# Patient Record
Sex: Female | Born: 1955
Health system: Southern US, Community
[De-identification: ages and names within clinical notes are randomized; demographics above are authoritative.]

## PROBLEM LIST (undated history)

## (undated) DIAGNOSIS — E7800 Pure hypercholesterolemia, unspecified: Secondary | ICD-10-CM

## (undated) DIAGNOSIS — M1990 Unspecified osteoarthritis, unspecified site: Secondary | ICD-10-CM

## (undated) DIAGNOSIS — R112 Nausea with vomiting, unspecified: Secondary | ICD-10-CM

## (undated) DIAGNOSIS — F419 Anxiety disorder, unspecified: Secondary | ICD-10-CM

## (undated) DIAGNOSIS — K219 Gastro-esophageal reflux disease without esophagitis: Secondary | ICD-10-CM

## (undated) DIAGNOSIS — Z98811 Dental restoration status: Secondary | ICD-10-CM

## (undated) DIAGNOSIS — M65841 Other synovitis and tenosynovitis, right hand: Secondary | ICD-10-CM

## (undated) DIAGNOSIS — J45909 Unspecified asthma, uncomplicated: Secondary | ICD-10-CM

## (undated) DIAGNOSIS — F32A Depression, unspecified: Secondary | ICD-10-CM

## (undated) DIAGNOSIS — F329 Major depressive disorder, single episode, unspecified: Secondary | ICD-10-CM

## (undated) DIAGNOSIS — E119 Type 2 diabetes mellitus without complications: Secondary | ICD-10-CM

## (undated) DIAGNOSIS — I10 Essential (primary) hypertension: Secondary | ICD-10-CM

## (undated) DIAGNOSIS — L409 Psoriasis, unspecified: Secondary | ICD-10-CM

## (undated) DIAGNOSIS — Z9889 Other specified postprocedural states: Secondary | ICD-10-CM

## (undated) HISTORY — PX: REDUCTION MAMMAPLASTY: SUR839

---
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
## 1993-11-18 HISTORY — PX: MYOMECTOMY: SHX85

## 1998-10-23 ENCOUNTER — Other Ambulatory Visit: Admission: RE | Admit: 1998-10-23 | Discharge: 1998-10-23 | Payer: Self-pay | Admitting: Obstetrics & Gynecology

## 1998-11-14 ENCOUNTER — Ambulatory Visit (HOSPITAL_COMMUNITY): Admission: RE | Admit: 1998-11-14 | Discharge: 1998-11-14 | Payer: Self-pay | Admitting: Obstetrics & Gynecology

## 1998-11-14 ENCOUNTER — Encounter: Payer: Self-pay | Admitting: Obstetrics & Gynecology

## 1998-11-18 HISTORY — PX: BREAST REDUCTION SURGERY: SHX8

## 1999-11-28 ENCOUNTER — Ambulatory Visit (HOSPITAL_COMMUNITY): Admission: RE | Admit: 1999-11-28 | Discharge: 1999-11-28 | Payer: Self-pay | Admitting: Obstetrics & Gynecology

## 1999-11-28 ENCOUNTER — Encounter: Payer: Self-pay | Admitting: Obstetrics & Gynecology

## 2000-02-20 ENCOUNTER — Encounter (INDEPENDENT_AMBULATORY_CARE_PROVIDER_SITE_OTHER): Payer: Self-pay

## 2000-02-20 ENCOUNTER — Other Ambulatory Visit: Admission: RE | Admit: 2000-02-20 | Discharge: 2000-02-20 | Payer: Self-pay | Admitting: Obstetrics and Gynecology

## 2000-12-05 ENCOUNTER — Ambulatory Visit (HOSPITAL_COMMUNITY): Admission: RE | Admit: 2000-12-05 | Discharge: 2000-12-05 | Payer: Self-pay | Admitting: Obstetrics and Gynecology

## 2000-12-05 ENCOUNTER — Encounter: Payer: Self-pay | Admitting: Obstetrics and Gynecology

## 2000-12-16 ENCOUNTER — Other Ambulatory Visit: Admission: RE | Admit: 2000-12-16 | Discharge: 2000-12-16 | Payer: Self-pay | Admitting: Obstetrics and Gynecology

## 2001-12-08 ENCOUNTER — Ambulatory Visit (HOSPITAL_COMMUNITY): Admission: RE | Admit: 2001-12-08 | Discharge: 2001-12-08 | Payer: Self-pay | Admitting: Obstetrics and Gynecology

## 2001-12-08 ENCOUNTER — Encounter: Payer: Self-pay | Admitting: Obstetrics and Gynecology

## 2001-12-14 ENCOUNTER — Other Ambulatory Visit: Admission: RE | Admit: 2001-12-14 | Discharge: 2001-12-14 | Payer: Self-pay | Admitting: Obstetrics and Gynecology

## 2002-08-02 ENCOUNTER — Encounter: Admission: RE | Admit: 2002-08-02 | Discharge: 2002-10-31 | Payer: Self-pay | Admitting: Endocrinology

## 2002-11-08 ENCOUNTER — Encounter: Admission: RE | Admit: 2002-11-08 | Discharge: 2003-02-06 | Payer: Self-pay | Admitting: Endocrinology

## 2002-12-09 ENCOUNTER — Ambulatory Visit (HOSPITAL_COMMUNITY): Admission: RE | Admit: 2002-12-09 | Discharge: 2002-12-09 | Payer: Self-pay | Admitting: Obstetrics and Gynecology

## 2002-12-09 ENCOUNTER — Encounter: Payer: Self-pay | Admitting: Obstetrics and Gynecology

## 2002-12-15 ENCOUNTER — Other Ambulatory Visit: Admission: RE | Admit: 2002-12-15 | Discharge: 2002-12-15 | Payer: Self-pay | Admitting: Obstetrics and Gynecology

## 2002-12-29 ENCOUNTER — Encounter: Admission: RE | Admit: 2002-12-29 | Discharge: 2003-03-29 | Payer: Self-pay | Admitting: Endocrinology

## 2003-02-07 ENCOUNTER — Other Ambulatory Visit: Admission: RE | Admit: 2003-02-07 | Discharge: 2003-02-07 | Payer: Self-pay | Admitting: Obstetrics and Gynecology

## 2003-03-18 ENCOUNTER — Encounter (INDEPENDENT_AMBULATORY_CARE_PROVIDER_SITE_OTHER): Payer: Self-pay | Admitting: *Deleted

## 2003-03-18 ENCOUNTER — Ambulatory Visit (HOSPITAL_COMMUNITY): Admission: RE | Admit: 2003-03-18 | Discharge: 2003-03-18 | Payer: Self-pay | Admitting: Obstetrics and Gynecology

## 2003-03-18 HISTORY — PX: LEEP: SHX91

## 2003-08-17 ENCOUNTER — Other Ambulatory Visit: Admission: RE | Admit: 2003-08-17 | Discharge: 2003-08-17 | Payer: Self-pay | Admitting: Obstetrics and Gynecology

## 2003-12-12 ENCOUNTER — Ambulatory Visit (HOSPITAL_COMMUNITY): Admission: RE | Admit: 2003-12-12 | Discharge: 2003-12-12 | Payer: Self-pay | Admitting: Obstetrics and Gynecology

## 2003-12-19 ENCOUNTER — Other Ambulatory Visit: Admission: RE | Admit: 2003-12-19 | Discharge: 2003-12-19 | Payer: Self-pay | Admitting: Obstetrics and Gynecology

## 2004-10-19 ENCOUNTER — Ambulatory Visit: Payer: Self-pay | Admitting: Licensed Clinical Social Worker

## 2004-11-02 ENCOUNTER — Ambulatory Visit: Payer: Self-pay | Admitting: Licensed Clinical Social Worker

## 2004-11-23 ENCOUNTER — Ambulatory Visit: Payer: Self-pay | Admitting: Licensed Clinical Social Worker

## 2004-12-21 ENCOUNTER — Ambulatory Visit (HOSPITAL_COMMUNITY): Admission: RE | Admit: 2004-12-21 | Discharge: 2004-12-21 | Payer: Self-pay | Admitting: Obstetrics and Gynecology

## 2004-12-26 ENCOUNTER — Other Ambulatory Visit: Admission: RE | Admit: 2004-12-26 | Discharge: 2004-12-26 | Payer: Self-pay | Admitting: Obstetrics and Gynecology

## 2004-12-28 ENCOUNTER — Ambulatory Visit: Payer: Self-pay | Admitting: Licensed Clinical Social Worker

## 2005-01-11 ENCOUNTER — Encounter: Admission: RE | Admit: 2005-01-11 | Discharge: 2005-01-11 | Payer: Self-pay | Admitting: Obstetrics and Gynecology

## 2005-01-25 ENCOUNTER — Ambulatory Visit: Payer: Self-pay | Admitting: Licensed Clinical Social Worker

## 2005-02-08 ENCOUNTER — Ambulatory Visit: Payer: Self-pay | Admitting: Licensed Clinical Social Worker

## 2005-03-08 ENCOUNTER — Ambulatory Visit: Payer: Self-pay | Admitting: Licensed Clinical Social Worker

## 2005-03-29 ENCOUNTER — Ambulatory Visit: Payer: Self-pay | Admitting: Licensed Clinical Social Worker

## 2005-04-30 ENCOUNTER — Ambulatory Visit: Payer: Self-pay | Admitting: Licensed Clinical Social Worker

## 2005-05-02 ENCOUNTER — Ambulatory Visit: Payer: Self-pay | Admitting: Internal Medicine

## 2005-05-15 ENCOUNTER — Ambulatory Visit: Payer: Self-pay | Admitting: Licensed Clinical Social Worker

## 2005-06-07 ENCOUNTER — Ambulatory Visit: Payer: Self-pay | Admitting: Licensed Clinical Social Worker

## 2005-06-28 ENCOUNTER — Ambulatory Visit: Payer: Self-pay | Admitting: Licensed Clinical Social Worker

## 2005-12-27 ENCOUNTER — Other Ambulatory Visit: Admission: RE | Admit: 2005-12-27 | Discharge: 2005-12-27 | Payer: Self-pay | Admitting: Obstetrics and Gynecology

## 2006-01-15 ENCOUNTER — Ambulatory Visit (HOSPITAL_COMMUNITY): Admission: RE | Admit: 2006-01-15 | Discharge: 2006-01-15 | Payer: Self-pay | Admitting: Obstetrics and Gynecology

## 2006-04-10 ENCOUNTER — Ambulatory Visit: Payer: Self-pay | Admitting: Endocrinology

## 2006-04-16 ENCOUNTER — Ambulatory Visit: Payer: Self-pay | Admitting: Oncology

## 2006-04-18 ENCOUNTER — Ambulatory Visit: Payer: Self-pay | Admitting: Endocrinology

## 2006-04-21 LAB — CBC WITH DIFFERENTIAL/PLATELET
Basophils Absolute: 0 10*3/uL (ref 0.0–0.1)
Eosinophils Absolute: 0 10*3/uL (ref 0.0–0.5)
HCT: 38.3 % (ref 34.8–46.6)
HGB: 13.2 g/dL (ref 11.6–15.9)
LYMPH%: 27.2 % (ref 14.0–48.0)
MCV: 94.1 fL (ref 81.0–101.0)
MONO#: 0.4 10*3/uL (ref 0.1–0.9)
MONO%: 15.6 % — ABNORMAL HIGH (ref 0.0–13.0)
NEUT#: 1.4 10*3/uL — ABNORMAL LOW (ref 1.5–6.5)
NEUT%: 55.5 % (ref 39.6–76.8)
Platelets: 263 10*3/uL (ref 145–400)
RBC: 4.07 10*6/uL (ref 3.70–5.32)
WBC: 2.5 10*3/uL — ABNORMAL LOW (ref 3.9–10.0)

## 2006-04-21 LAB — CHCC SMEAR

## 2006-04-22 LAB — COMPREHENSIVE METABOLIC PANEL
ALT: 22 U/L (ref 0–40)
BUN: 16 mg/dL (ref 6–23)
CO2: 26 mEq/L (ref 19–32)
Calcium: 9.2 mg/dL (ref 8.4–10.5)
Chloride: 103 mEq/L (ref 96–112)
Creatinine, Ser: 1.01 mg/dL (ref 0.40–1.20)
Glucose, Bld: 79 mg/dL (ref 70–99)
Total Bilirubin: 0.4 mg/dL (ref 0.3–1.2)

## 2006-04-22 LAB — LACTATE DEHYDROGENASE: LDH: 152 U/L (ref 94–250)

## 2006-04-22 LAB — ANA: Anti Nuclear Antibody(ANA): POSITIVE — AB

## 2006-04-24 LAB — ANTI-NUCLEAR AB-TITER (ANA TITER): ANA Titer 1: 1:640 {titer} — ABNORMAL HIGH

## 2006-04-30 ENCOUNTER — Ambulatory Visit: Payer: Self-pay

## 2006-06-10 ENCOUNTER — Ambulatory Visit: Payer: Self-pay | Admitting: Oncology

## 2006-06-16 LAB — CBC WITH DIFFERENTIAL/PLATELET
BASO%: 0.6 % (ref 0.0–2.0)
Basophils Absolute: 0 10e3/uL (ref 0.0–0.1)
EOS%: 4.8 % (ref 0.0–7.0)
Eosinophils Absolute: 0.1 10e3/uL (ref 0.0–0.5)
HCT: 36 % (ref 34.8–46.6)
HGB: 12.6 g/dL (ref 11.6–15.9)
LYMPH%: 34.5 % (ref 14.0–48.0)
MCH: 32.8 pg (ref 26.0–34.0)
MCHC: 34.9 g/dL (ref 32.0–36.0)
MCV: 93.9 fL (ref 81.0–101.0)
MONO#: 0.3 10e3/uL (ref 0.1–0.9)
MONO%: 12.4 % (ref 0.0–13.0)
NEUT#: 1.1 10e3/uL — ABNORMAL LOW (ref 1.5–6.5)
NEUT%: 47.7 % (ref 39.6–76.8)
Platelets: 340 10e3/uL (ref 145–400)
RBC: 3.83 10e6/uL (ref 3.70–5.32)
RDW: 14.1 % (ref 11.3–14.5)
WBC: 2.2 10e3/uL — ABNORMAL LOW (ref 3.9–10.0)
lymph#: 0.8 10e3/uL — ABNORMAL LOW (ref 0.9–3.3)

## 2007-01-29 ENCOUNTER — Ambulatory Visit (HOSPITAL_COMMUNITY): Admission: RE | Admit: 2007-01-29 | Discharge: 2007-01-29 | Payer: Self-pay | Admitting: Obstetrics and Gynecology

## 2007-04-14 ENCOUNTER — Ambulatory Visit: Payer: Self-pay | Admitting: Endocrinology

## 2007-04-14 LAB — CONVERTED CEMR LAB
Basophils Absolute: 0 10*3/uL (ref 0.0–0.1)
Bilirubin Urine: NEGATIVE
Bilirubin, Direct: 0.1 mg/dL (ref 0.0–0.3)
Cholesterol: 169 mg/dL (ref 0–200)
Eosinophils Absolute: 0 10*3/uL (ref 0.0–0.6)
GFR calc Af Amer: 136 mL/min
GFR calc non Af Amer: 112 mL/min
Glucose, Bld: 109 mg/dL — ABNORMAL HIGH (ref 70–99)
HCT: 38.2 % (ref 36.0–46.0)
Ketones, ur: NEGATIVE mg/dL
Leukocytes, UA: NEGATIVE
MCHC: 34.4 g/dL (ref 30.0–36.0)
MCV: 94 fL (ref 78.0–100.0)
Neutrophils Relative %: 59.4 % (ref 43.0–77.0)
Nitrite: NEGATIVE
Potassium: 3.7 meq/L (ref 3.5–5.1)
RBC: 4.06 M/uL (ref 3.87–5.11)
Sodium: 138 meq/L (ref 135–145)
TSH: 0.09 microintl units/mL — ABNORMAL LOW (ref 0.35–5.50)
Total CHOL/HDL Ratio: 2.5
Total Protein, Urine: NEGATIVE mg/dL
Total Protein: 7.4 g/dL (ref 6.0–8.3)
Triglycerides: 41 mg/dL (ref 0–149)
Urine Glucose: NEGATIVE mg/dL
Urobilinogen, UA: 0.2 (ref 0.0–1.0)
pH: 7.5 (ref 5.0–8.0)

## 2007-05-28 ENCOUNTER — Ambulatory Visit: Payer: Self-pay | Admitting: Endocrinology

## 2007-05-28 LAB — CONVERTED CEMR LAB
Hep A Total Ab: NEGATIVE
TSH: 0.99 microintl units/mL (ref 0.35–5.50)

## 2007-06-25 ENCOUNTER — Ambulatory Visit: Payer: Self-pay | Admitting: Endocrinology

## 2007-07-10 ENCOUNTER — Ambulatory Visit: Payer: Self-pay | Admitting: Endocrinology

## 2007-08-22 ENCOUNTER — Encounter: Payer: Self-pay | Admitting: *Deleted

## 2007-08-22 DIAGNOSIS — E785 Hyperlipidemia, unspecified: Secondary | ICD-10-CM | POA: Insufficient documentation

## 2007-08-22 DIAGNOSIS — E119 Type 2 diabetes mellitus without complications: Secondary | ICD-10-CM | POA: Insufficient documentation

## 2007-08-22 DIAGNOSIS — J309 Allergic rhinitis, unspecified: Secondary | ICD-10-CM | POA: Insufficient documentation

## 2007-08-22 DIAGNOSIS — T783XXA Angioneurotic edema, initial encounter: Secondary | ICD-10-CM | POA: Insufficient documentation

## 2007-08-22 DIAGNOSIS — F329 Major depressive disorder, single episode, unspecified: Secondary | ICD-10-CM | POA: Insufficient documentation

## 2007-08-22 DIAGNOSIS — B009 Herpesviral infection, unspecified: Secondary | ICD-10-CM | POA: Insufficient documentation

## 2008-01-01 ENCOUNTER — Ambulatory Visit: Payer: Self-pay | Admitting: Endocrinology

## 2008-01-01 LAB — CONVERTED CEMR LAB: TSH: 1.59 microintl units/mL (ref 0.35–5.50)

## 2008-02-10 ENCOUNTER — Encounter: Payer: Self-pay | Admitting: Endocrinology

## 2008-02-17 ENCOUNTER — Ambulatory Visit (HOSPITAL_COMMUNITY): Admission: RE | Admit: 2008-02-17 | Discharge: 2008-02-17 | Payer: Self-pay | Admitting: Obstetrics and Gynecology

## 2008-02-25 ENCOUNTER — Ambulatory Visit: Payer: Self-pay | Admitting: Endocrinology

## 2008-02-25 DIAGNOSIS — M6530 Trigger finger, unspecified finger: Secondary | ICD-10-CM | POA: Insufficient documentation

## 2008-03-04 ENCOUNTER — Encounter: Admission: RE | Admit: 2008-03-04 | Discharge: 2008-03-04 | Payer: Self-pay | Admitting: Neurology

## 2008-03-08 ENCOUNTER — Encounter: Payer: Self-pay | Admitting: Endocrinology

## 2008-04-05 ENCOUNTER — Encounter: Payer: Self-pay | Admitting: Endocrinology

## 2008-05-30 ENCOUNTER — Encounter: Payer: Self-pay | Admitting: Endocrinology

## 2008-10-06 ENCOUNTER — Encounter: Payer: Self-pay | Admitting: Endocrinology

## 2008-11-15 ENCOUNTER — Ambulatory Visit (HOSPITAL_BASED_OUTPATIENT_CLINIC_OR_DEPARTMENT_OTHER): Admission: RE | Admit: 2008-11-15 | Discharge: 2008-11-15 | Payer: Self-pay | Admitting: Orthopedic Surgery

## 2008-11-15 HISTORY — PX: TRIGGER FINGER RELEASE: SHX641

## 2008-11-22 ENCOUNTER — Encounter: Payer: Self-pay | Admitting: Endocrinology

## 2008-12-20 ENCOUNTER — Encounter: Payer: Self-pay | Admitting: Endocrinology

## 2009-01-04 ENCOUNTER — Encounter: Payer: Self-pay | Admitting: Endocrinology

## 2009-01-31 ENCOUNTER — Encounter: Payer: Self-pay | Admitting: Endocrinology

## 2009-02-20 ENCOUNTER — Ambulatory Visit (HOSPITAL_COMMUNITY): Admission: RE | Admit: 2009-02-20 | Discharge: 2009-02-20 | Payer: Self-pay | Admitting: Obstetrics and Gynecology

## 2009-03-02 ENCOUNTER — Ambulatory Visit: Payer: Self-pay | Admitting: Endocrinology

## 2009-03-04 LAB — CONVERTED CEMR LAB
AST: 27 units/L (ref 0–37)
Albumin: 3.8 g/dL (ref 3.5–5.2)
Alkaline Phosphatase: 78 units/L (ref 39–117)
BUN: 15 mg/dL (ref 6–23)
Basophils Absolute: 0 10*3/uL (ref 0.0–0.1)
Bilirubin, Direct: 0.1 mg/dL (ref 0.0–0.3)
CO2: 29 meq/L (ref 19–32)
Chloride: 105 meq/L (ref 96–112)
Creatinine, Ser: 0.7 mg/dL (ref 0.4–1.2)
Eosinophils Absolute: 0.1 10*3/uL (ref 0.0–0.7)
Hemoglobin, Urine: NEGATIVE
Hemoglobin: 12.9 g/dL (ref 12.0–15.0)
LDL Cholesterol: 97 mg/dL (ref 0–99)
Lymphocytes Relative: 39.3 % (ref 12.0–46.0)
MCHC: 33.3 g/dL (ref 30.0–36.0)
Monocytes Relative: 12.2 % — ABNORMAL HIGH (ref 3.0–12.0)
Neutro Abs: 0.9 10*3/uL — ABNORMAL LOW (ref 1.4–7.7)
Neutrophils Relative %: 43 % (ref 43.0–77.0)
Nitrite: NEGATIVE
Potassium: 4.1 meq/L (ref 3.5–5.1)
RDW: 12.6 % (ref 11.5–14.6)
Specific Gravity, Urine: 1.015 (ref 1.000–1.030)
Total Bilirubin: 0.5 mg/dL (ref 0.3–1.2)
Total CHOL/HDL Ratio: 3
Total Protein, Urine: NEGATIVE mg/dL
Triglycerides: 52 mg/dL (ref 0.0–149.0)
Urine Glucose: NEGATIVE mg/dL
VLDL: 10.4 mg/dL (ref 0.0–40.0)
pH: 6.5 (ref 5.0–8.0)

## 2009-03-06 ENCOUNTER — Ambulatory Visit: Payer: Self-pay | Admitting: Endocrinology

## 2009-03-06 DIAGNOSIS — D72819 Decreased white blood cell count, unspecified: Secondary | ICD-10-CM | POA: Insufficient documentation

## 2009-03-06 DIAGNOSIS — Z780 Asymptomatic menopausal state: Secondary | ICD-10-CM | POA: Insufficient documentation

## 2009-03-06 DIAGNOSIS — E042 Nontoxic multinodular goiter: Secondary | ICD-10-CM | POA: Insufficient documentation

## 2009-03-13 ENCOUNTER — Ambulatory Visit: Payer: Self-pay | Admitting: Internal Medicine

## 2009-03-13 ENCOUNTER — Encounter: Payer: Self-pay | Admitting: Endocrinology

## 2009-03-14 ENCOUNTER — Encounter: Payer: Self-pay | Admitting: Endocrinology

## 2009-04-28 ENCOUNTER — Ambulatory Visit: Payer: Self-pay | Admitting: Gastroenterology

## 2009-05-12 ENCOUNTER — Ambulatory Visit: Payer: Self-pay | Admitting: Gastroenterology

## 2009-06-02 ENCOUNTER — Encounter: Payer: Self-pay | Admitting: Endocrinology

## 2009-06-15 ENCOUNTER — Ambulatory Visit (HOSPITAL_BASED_OUTPATIENT_CLINIC_OR_DEPARTMENT_OTHER): Admission: RE | Admit: 2009-06-15 | Discharge: 2009-06-15 | Payer: Self-pay | Admitting: Orthopedic Surgery

## 2009-06-15 HISTORY — PX: TRIGGER FINGER RELEASE: SHX641

## 2009-06-23 ENCOUNTER — Encounter: Payer: Self-pay | Admitting: Endocrinology

## 2009-07-05 ENCOUNTER — Encounter: Payer: Self-pay | Admitting: Endocrinology

## 2009-07-26 ENCOUNTER — Encounter: Payer: Self-pay | Admitting: Endocrinology

## 2009-08-16 ENCOUNTER — Emergency Department (HOSPITAL_COMMUNITY): Admission: EM | Admit: 2009-08-16 | Discharge: 2009-08-16 | Payer: Self-pay | Admitting: Family Medicine

## 2009-11-07 ENCOUNTER — Encounter: Payer: Self-pay | Admitting: Endocrinology

## 2010-01-18 ENCOUNTER — Ambulatory Visit: Payer: Self-pay | Admitting: Endocrinology

## 2010-01-19 LAB — CONVERTED CEMR LAB: Hgb A1c MFr Bld: 6.5 % (ref 4.6–6.5)

## 2010-02-19 ENCOUNTER — Encounter: Payer: Self-pay | Admitting: Endocrinology

## 2010-02-19 ENCOUNTER — Telehealth: Payer: Self-pay | Admitting: Endocrinology

## 2010-03-12 ENCOUNTER — Telehealth: Payer: Self-pay | Admitting: Endocrinology

## 2010-05-07 ENCOUNTER — Encounter: Payer: Self-pay | Admitting: Endocrinology

## 2010-08-22 ENCOUNTER — Encounter: Payer: Self-pay | Admitting: Endocrinology

## 2010-09-18 ENCOUNTER — Ambulatory Visit (HOSPITAL_COMMUNITY): Admission: RE | Admit: 2010-09-18 | Discharge: 2010-09-18 | Payer: Self-pay | Admitting: Obstetrics and Gynecology

## 2010-12-07 ENCOUNTER — Other Ambulatory Visit: Payer: Self-pay | Admitting: Endocrinology

## 2010-12-07 ENCOUNTER — Ambulatory Visit
Admission: RE | Admit: 2010-12-07 | Discharge: 2010-12-07 | Payer: Self-pay | Source: Home / Self Care | Attending: Endocrinology | Admitting: Endocrinology

## 2010-12-07 LAB — LIPID PANEL
Cholesterol: 160 mg/dL (ref 0–200)
HDL: 55.1 mg/dL (ref >39.00)
LDL Cholesterol: 83 mg/dL (ref 0–99)
Total CHOL/HDL Ratio: 3
Triglycerides: 112 mg/dL (ref 0.0–149.0)
VLDL: 22.4 mg/dL (ref 0.0–40.0)

## 2010-12-07 LAB — TSH: TSH: 2.25 u[IU]/mL (ref 0.35–5.50)

## 2010-12-07 LAB — BASIC METABOLIC PANEL
BUN: 14 mg/dL (ref 6–23)
CO2: 32 mEq/L (ref 19–32)
Calcium: 9.2 mg/dL (ref 8.4–10.5)
Chloride: 103 mEq/L (ref 96–112)
Creatinine, Ser: 0.7 mg/dL (ref 0.4–1.2)
GFR: 115.82 mL/min (ref >60.00)
Glucose, Bld: 91 mg/dL (ref 70–99)
Potassium: 4 mEq/L (ref 3.5–5.1)
Sodium: 141 mEq/L (ref 135–145)

## 2010-12-07 LAB — HEMOGLOBIN A1C: Hgb A1c MFr Bld: 6.5 % (ref 4.6–6.5)

## 2010-12-09 ENCOUNTER — Encounter: Payer: Self-pay | Admitting: Obstetrics and Gynecology

## 2010-12-10 ENCOUNTER — Encounter: Payer: Self-pay | Admitting: Neurology

## 2010-12-18 NOTE — Progress Notes (Signed)
  Phone Note Refill Request Message from:  Fax from Pharmacy on March 12, 2010 11:56 AM  Refills Requested: Medication #1:  SIMVASTATIN 80 MG TABS 1 qhs   Dosage confirmed as above?Dosage Confirmed Initial call taken by: Josph Macho RMA,  March 12, 2010 11:56 AM    Prescriptions: SIMVASTATIN 80 MG TABS (SIMVASTATIN) 1 qhs  #90 x 2   Entered by:   Josph Macho RMA   Authorized by:   Minus Breeding MD   Signed by:   Josph Macho RMA on 03/12/2010   Method used:   Electronically to        [facility] (retail)       [address]       [place], [place]  [address]       Ph: [phone]       Fax: [phone]   RxID:   [id number]

## 2010-12-18 NOTE — Progress Notes (Signed)
  Phone Note Refill Request Message from:  Fax from Pharmacy on February 19, 2010 9:15 AM  Refills Requested: Medication #1:  TENEX 1 MG  TABS take 1 by mouth qd   Dosage confirmed as above?Dosage Confirmed Initial call taken by: Josph Macho RMA,  February 19, 2010 9:15 AM    Prescriptions: TENEX 1 MG  TABS (GUANFACINE HCL) take 1 by mouth qd  #90 x 2   Entered by:   Josph Macho RMA   Authorized by:   Minus Breeding MD   Signed by:   Josph Macho RMA on 02/19/2010   Method used:   Electronically to        [facility] (retail)       [address]       [place], [place]  [address]       Ph: [phone]       Fax: [phone]   RxID:   [id number]

## 2010-12-18 NOTE — Letter (Signed)
Summary: [facility]  [facility]   Imported By: Sherian Rein 09/03/2010 09:11:44  _____________________________________________________________________  External Attachment:    Type:   Image     Comment:   External Document

## 2010-12-18 NOTE — Assessment & Plan Note (Signed)
Summary: ALLERGIES/NWS  #   Vital Signs:  Patient profile:   55 year old female Height:      65 inches (165.10 cm) Weight:      194 pounds (88.18 kg) BMI:     32.40 O2 Sat:      97 % on Room air Temp:     97 degrees F (36.11 degrees C) oral Pulse rate:   85 / minute BP sitting:   130 / 82  (left arm) Cuff size:   regular  Vitals Entered By: Sydell Axon (January 18, 2010 3:29 PM)  O2 Flow:  Room air CC: Allergies/ pt states she has been coughing up thick mucus/ [place] Is Patient Diabetic? Yes   CC:  Allergies/ pt states she has been coughing up thick mucus/ [place].  History of Present Illness: pt states few weeks of nasal congestion, prod cough, slight sore throat, and slight wheezing. she takes metformin as rx'ed.  Current Medications (verified): 1)  Iron 325 (65 Fe) Mg  Tabs (Ferrous Sulfate) .... Take 1 By Mouth Qd 2)  Hydrochlorothiazide 12.5 Mg  Tabs (Hydrochlorothiazide) .... Take 1 By Mouth Qd 3)  Tenex 1 Mg  Tabs (Guanfacine Hcl) .... Take 1 By Mouth Qd 4)  Prozac 20 Mg  Caps (Fluoxetine Hcl) .... Take 2 By Mouth Qd 5)  Allegra 180 Mg  Tabs (Fexofenadine Hcl) .... Only Uses When Allergies Are Bad 6)  Lidex 0.05 %  Crea (Fluocinonide) .... Three Times A Day As Needed Rash 7)  Glucophage Xr 500 Mg  Tb24 (Metformin Hcl) .... Qd 8)  Glucose Test Strips and Lancets .... Any Brand Once Daily 250.00 9)  Claritin-D 24 Hour 10-240 Mg Xr24h-Tab (Loratadine-Pseudoephedrine) .Marland Kitchen.. 1 Tab Qam 10)  Simvastatin 80 Mg Tabs (Simvastatin) .Marland Kitchen.. 1 Qhs  Allergies (verified): 1)  ! * Altace 2)  ! Morphine 3)  ! * Meceracom  Past History:  Past Medical History: Last updated: 01/01/2008  PAIN IN SOFT TISSUES OF LIMB (ICD-729.5) UNSPECIFIED DISORDER OF THYROID (ICD-246.9) HSV (ICD-054.9) ORAL CONTRACEPTION (ICD-V25.41) ANGIOEDEMA (ICD-995.1) HYPERLIPIDEMIA (ICD-272.4) DIABETES MELLITUS, TYPE II (ICD-250.00) DEPRESSION (ICD-311) ALLERGIC RHINITIS (ICD-477.9)  Review of  Systems  The patient denies fever.         earache is improved.  Physical Exam  General:  normal appearance.  no distress  Head:  head: no deformity eyes: no periorbital swelling, no proptosis external nose and ears are normal mouth: no lesion seen Ears:  left tm is red.  right is congested Lungs:  Clear to auscultation bilaterally. Normal respiratory effort.  Additional Exam:  Hemoglobin A1C            6.5 %      Impression & Recommendations:  Problem # 1:  ALLERGIC RHINITIS (ICD-477.9) needs increased rx  Problem # 2:  DIABETES MELLITUS, TYPE II (ICD-250.00) well-controlled  Medications Added to Medication List This Visit: 1)  Azithromycin 500 Mg Tabs (Azithromycin) .Marland Kitchen.. 1 qd 2)  Promethazine-codeine 6.25-10 Mg/81ml Syrp (Promethazine-codeine) .Marland Kitchen.. 1 teaspoon every 4 hrs as needed for cough  Other Orders: TLB-A1C / Hgb A1C (Glycohemoglobin) (83036-A1C) Est. Patient Level III ([address])  Patient Instructions: 1)  take store-brand claritin-d as needed congestion. 2)  azithromycin 500 mg once daily 3)  promethazine-codeine 1 teaspoon every 4 hrs as needed for cough. 4)  symbicort-160, 1 puff two times a day, until wheezing is better.  rinse mouth after using. Prescriptions: PROMETHAZINE-CODEINE 6.25-10 MG/5ML SYRP (PROMETHAZINE-CODEINE) 1 teaspoon every 4 hrs as needed for  cough  #8 oz x 1   Entered and Authorized by:   Minus Breeding MD   Signed by:   Minus Breeding MD on 01/18/2010   Method used:   Print then Give to Patient   RxID:   [id number] AZITHROMYCIN 500 MG TABS (AZITHROMYCIN) 1 qd  #6 x 0   Entered and Authorized by:   Minus Breeding MD   Signed by:   Minus Breeding MD on 01/18/2010   Method used:   Electronically to        [facility] (retail)       [address]       [place], [place]  [address]       Ph: [phone]       Fax: [phone]   RxID:   [id number]

## 2010-12-18 NOTE — Letter (Signed)
Summary: [facility]   Imported By: Lester Antreville 05/10/2010 10:11:40  _____________________________________________________________________  External Attachment:    Type:   Image     Comment:   External Document

## 2010-12-18 NOTE — Letter (Signed)
Summary: [facility]  [facility]   Imported By: Sherian Rein 03/16/2010 09:14:27  _____________________________________________________________________  External Attachment:    Type:   Image     Comment:   External Document

## 2010-12-20 NOTE — Assessment & Plan Note (Signed)
Summary: f/u for meds/#/cd   Vital Signs:  Patient profile:   55 year old female Height:      65 inches (165.10 cm) Weight:      193 pounds (87.73 kg) BMI:     32.23 O2 Sat:      97 % on Room air Temp:     98.7 degrees F (37.06 degrees C) oral Pulse rate:   86 / minute Pulse rhythm:   regular BP sitting:   112 / 70  (left arm) Cuff size:   regular  Vitals Entered By: Brenton Grills CMA Duncan Dull) (December 07, 2010 2:45 PM)  O2 Flow:  Room air  CC: F/U for refill on meds/aj Is Patient Diabetic? Yes   CC:  F/U for refill on meds/aj.  History of Present Illness: no cbg record, but states cbg's are low-100's.   she hopes to regain her health insurance soon.    Current Medications (verified): 1)  Iron 325 (65 Fe) Mg  Tabs (Ferrous Sulfate) .... Take 1 By Mouth Qd 2)  Hydrochlorothiazide 12.5 Mg  Tabs (Hydrochlorothiazide) .... Take 1 By Mouth Qd 3)  Tenex 1 Mg  Tabs (Guanfacine Hcl) .... Take 1 By Mouth Qd 4)  Prozac 20 Mg  Caps (Fluoxetine Hcl) .... Take 2 By Mouth Qd 5)  Allegra 180 Mg  Tabs (Fexofenadine Hcl) .... Only Uses When Allergies Are Bad 6)  Lidex 0.05 %  Crea (Fluocinonide) .... Three Times A Day As Needed Rash 7)  Glucophage Xr 500 Mg  Tb24 (Metformin Hcl) .... Qd 8)  Glucose Test Strips and Lancets .... Any Brand Once Daily 250.00 9)  Claritin-D 24 Hour 10-240 Mg Xr24h-Tab (Loratadine-Pseudoephedrine) .Marland Kitchen.. 1 Tab Qam 10)  Simvastatin 80 Mg Tabs (Simvastatin) .Marland Kitchen.. 1 Qhs 11)  Azithromycin 500 Mg Tabs (Azithromycin) .Marland Kitchen.. 1 Qd 12)  Promethazine-Codeine 6.25-10 Mg/48ml Syrp (Promethazine-Codeine) .Marland Kitchen.. 1 Teaspoon Every 4 Hrs As Needed For Cough  Allergies (verified): 1)  ! * Altace 2)  ! Morphine 3)  ! * Meceracom  Past History:  Past Medical History: Last updated: 01/01/2008  PAIN IN SOFT TISSUES OF LIMB (ICD-729.5) UNSPECIFIED DISORDER OF THYROID (ICD-246.9) HSV (ICD-054.9) ORAL CONTRACEPTION (ICD-V25.41) ANGIOEDEMA (ICD-995.1) HYPERLIPIDEMIA  (ICD-272.4) DIABETES MELLITUS, TYPE II (ICD-250.00) DEPRESSION (ICD-311) ALLERGIC RHINITIS (ICD-477.9)  Social History: single works as a Environmental manager       The patient complains of weight gain and peripheral edema.  The patient denies chest pain and dyspnea on exertion.         denies n/v  Physical Exam  General:  normal appearance.   Lungs:  Clear to auscultation bilaterally. Normal respiratory effort.  Heart:  Regular rate and rhythm without murmurs or gallops noted. Normal S1,S2.   Pulses:  dorsalis pedis intact bilat.  no carotid bruit Extremities:  no deformity.  no ulcer on the feet.  feet are of normal color and temp.  no edema  Neurologic:  sensation is intact to touch on the feet  Additional Exam:  LDL Cholesterol           83 mg/dL   Hemoglobin Z6X            6.5 %    Impression & Recommendations:  Problem # 1:  DIABETES MELLITUS, TYPE II (ICD-250.00) well-controlled  Problem # 2:  HYPERLIPIDEMIA (ICD-272.4) well-controlled  Other Orders: TLB-Lipid Panel (80061-LIPID) TLB-BMP (Basic Metabolic Panel-BMET) (80048-METABOL) TLB-A1C / Hgb A1C (Glycohemoglobin) (83036-A1C) TLB-TSH (Thyroid Stimulating Hormone) (84443-TSH) Est. Patient Level II (  [address])   Patient Instructions: 1)  blood tests are being ordered for you today.  please call [phone] to hear your test results. 2)  pending the test results, please continue the same medications for now. 3)  Please schedule a follow-up appointment in 1 year, or sooner if you regain your health insurance. 4)  well-controlled Prescriptions: SIMVASTATIN 80 MG TABS (SIMVASTATIN) 1 qhs  #90 x 2   Entered and Authorized by:   Minus Breeding MD   Signed by:   Minus Breeding MD on 12/07/2010   Method used:   Electronically to        [facility] (retail)       [address]       [place], [place]  [address]       Ph: [phone]       Fax: [phone]   RxID:    [id number] GLUCOPHAGE XR 500 MG  TB24 (METFORMIN HCL) qd  #90 Each x 3   Entered and Authorized by:   Minus Breeding MD   Signed by:   Minus Breeding MD on 12/07/2010   Method used:   Electronically to        [facility] (retail)       [address]       [place], [place]  [address]       Ph: [phone]       Fax: [phone]   RxID:   [id number] PROZAC 20 MG  CAPS (FLUOXETINE HCL) take 2 by mouth qd  #180 x 3   Entered and Authorized by:   Minus Breeding MD   Signed by:   Minus Breeding MD on 12/07/2010   Method used:   Electronically to        [facility] (retail)       [address]       [place], [place]  [address]       Ph: [phone]       Fax: [phone]   RxID:   [id number] TENEX 1 MG  TABS (GUANFACINE HCL) take 1 by mouth qd  #90 x 3   Entered and Authorized by:   Minus Breeding MD   Signed by:   Minus Breeding MD on 12/07/2010   Method used:   Electronically to        [facility] (retail)       [address]       [place], [place]  [address]       Ph: [phone]       Fax: [phone]   RxID:   [id number] HYDROCHLOROTHIAZIDE 12.5 MG  TABS (HYDROCHLOROTHIAZIDE) take 1 by mouth qd  #90 Each x 3   Entered and Authorized by:   Minus Breeding MD   Signed by:   Minus Breeding MD on 12/07/2010   Method used:   Electronically to        [facility] (retail)       [address]       [place], [place]  [address]       Ph: [phone]       Fax: [phone]  RxID:   [id number]    Orders Added: 1)  TLB-Lipid Panel [80061-LIPID] 2)  TLB-BMP (Basic Metabolic Panel-BMET) [80048-METABOL] 3)  TLB-A1C / Hgb A1C (Glycohemoglobin) [83036-A1C] 4)  TLB-TSH (Thyroid Stimulating Hormone) [84443-TSH] 5)  Est. Patient Level II [[address]]   Immunization History:  Influenza Immunization History:    Influenza:  historical  (09/18/2010)   Immunization History:  Influenza Immunization History:    Influenza:  Historical (09/18/2010)

## 2010-12-25 ENCOUNTER — Encounter: Payer: Self-pay | Admitting: Endocrinology

## 2010-12-25 DIAGNOSIS — Z0279 Encounter for issue of other medical certificate: Secondary | ICD-10-CM

## 2011-01-09 NOTE — Letter (Signed)
Summary: Exam forms/[employer]  Exam forms/[employer]   Imported By: Sherian Rein 01/01/2011 07:54:08  _____________________________________________________________________  External Attachment:    Type:   Image     Comment:   External Document

## 2011-02-24 LAB — BASIC METABOLIC PANEL
BUN: 13 mg/dL (ref 6–23)
Chloride: 105 mEq/L (ref 96–112)
GFR calc Af Amer: >60 mL/min (ref >60)
GFR calc non Af Amer: >60 mL/min (ref >60)
Potassium: 4 mEq/L (ref 3.5–5.1)
Sodium: 140 mEq/L (ref 135–145)

## 2011-02-24 LAB — POCT HEMOGLOBIN-HEMACUE: Hemoglobin: 12.5 g/dL (ref 12.0–15.0)

## 2011-02-24 LAB — GLUCOSE, CAPILLARY: Glucose-Capillary: 100 mg/dL — ABNORMAL HIGH (ref 70–99)

## 2011-02-25 LAB — GLUCOSE, CAPILLARY
Glucose-Capillary: 101 mg/dL — ABNORMAL HIGH (ref 70–99)
Glucose-Capillary: 87 mg/dL (ref 70–99)

## 2011-03-23 ENCOUNTER — Inpatient Hospital Stay (INDEPENDENT_AMBULATORY_CARE_PROVIDER_SITE_OTHER)
Admission: RE | Admit: 2011-03-23 | Discharge: 2011-03-23 | Disposition: A | Discharge status: Home / Self Care | Payer: BC Managed Care – PPO | Source: Ambulatory Visit | Attending: Family Medicine | Admitting: Family Medicine

## 2011-03-23 DIAGNOSIS — L738 Other specified follicular disorders: Secondary | ICD-10-CM

## 2011-03-23 DIAGNOSIS — L989 Disorder of the skin and subcutaneous tissue, unspecified: Secondary | ICD-10-CM

## 2011-04-02 NOTE — Op Note (Signed)
Brittany Taylor, Brittany Taylor               ACCOUNT NO.:  [id number]   MEDICAL RECORD NO.:  [id number]          PATIENT TYPE:  AMB   LOCATION:  DSC                          FACILITY:  [facility]   PHYSICIAN:  Cindee Salt, M.D.       DATE OF BIRTH:  09-18-1956   DATE OF PROCEDURE:  11/15/2008  DATE OF DISCHARGE:                               OPERATIVE REPORT   PREOPERATIVE DIAGNOSIS:  Stenosing tenosynovitis, right thumb.   POSTOPERATIVE DIAGNOSIS:  Stenosing tenosynovitis, right thumb.   OPERATION:  Release of A1 pulley, right thumb.   SURGEON:  Cindee Salt, MD   ASSISTANT:  Joaquin Courts, RN   ANESTHESIA:  Forearm-based IV regional.   ANESTHESIOLOGIST:  Bedelia Person, MD   HISTORY:  The patient is a 55 year old female with a history of  triggering and locking of her right thumb.  This has not responded to  conservative treatment.  She has elected to undergo release.  Perioperative and postoperative course has been discussed.  She is aware  that there is no guarantee with the surgery, possibility of infection;  recurrence of injury to arteries, nerves, or tendons; incomplete relief  of symptoms; or dystrophy.  In the preoperative area, the patient was  seen.  The extremity was marked by both the patient and surgeon and  antibiotic given.   PROCEDURE:  The patient was brought to the operating room.  A forearm-  based IV regional anesthetic was carried out without difficulty.  She  was prepped using DuraPrep, supine position with the right arm free.  A  time-out was taken.  Following this, a transverse incision was made over  the A1 pulley of the right thumb, carried down through subcutaneous  tissue.  The A1 pulley was identified along with the radial and ulnar  neurovascular bundles.  Retractors were placed.  An incision was then  made into the A1 pulley on the radial aspect taking care to protect the  oblique pulley.  This was released.  No further triggering was noted.  The wound was copiously  irrigated with saline.  The skin was then closed  with interrupted 5-0 Vicryl Rapide sutures.  A sterile compressive  dressing was applied.  The patient tolerated the procedure well and was  taken to the recovery room for observation in satisfactory condition.  She will be discharged home, to return to the [facility] [place]  in 1 week, on Vicodin.           ______________________________  Cindee Salt, M.D.     GK/MEDQ  D:  11/15/2008  T:  11/15/2008  Job:  [id number]

## 2011-04-02 NOTE — Op Note (Signed)
Brittany Taylor, Brittany Taylor               ACCOUNT NO.:  [id number]   MEDICAL RECORD NO.:  [id number]          PATIENT TYPE:  AMB   LOCATION:  DSC                          FACILITY:  [facility]   PHYSICIAN:  Cindee Salt, M.D.       DATE OF BIRTH:  1955-12-01   DATE OF PROCEDURE:  06/15/2009  DATE OF DISCHARGE:                               OPERATIVE REPORT   PREOPERATIVE DIAGNOSIS:  Stenosing tenosynovitis, left thumb.   POSTOPERATIVE DIAGNOSIS:  Stenosing tenosynovitis, left thumb.   OPERATION:  Release of A1 pulley, left thumb.   SURGEON:  Cindee Salt, MD   ASSISTANT:  Joaquin Courts, RN   ANESTHESIA:  Forearm-based IV regional.   ANESTHESIOLOGIST:  Janetta Hora. Gelene Mink, MD   HISTORY:  The patient is a 55 year old female with a history of  triggering of her left thumb.  This has not responded to conservative  treatment.  She has elected to undergo surgical decompression.  Pre,  peri, and postoperative course have been discussed along with risks and  complications.  She is aware that there is no guarantee with the  surgery; possibility of infection; recurrence of injury to arteries,  nerves, tendons; incomplete relief of symptoms; and dystrophy.  In the  preoperative area, the patient is seen.  The extremity marked by both  patient and surgeon.  Antibiotic given.   PROCEDURE:  The patient was brought to the operating room where a  forearm-based IV regional anesthetic was carried out without difficulty  under the direction of Dr. Gelene Mink.  She was prepped using ChloraPrep,  supine position, left arm free.  A time-out taken, 3-minute dry time  allowed.  She was then draped.  A transverse incision was made over the  A1 pulley of the left thumb and carried down through the subcutaneous  tissue.  The neurovascular structures were identified and protected.  Retractors placed.  Radial side of the A1 pulley, an incision was made  releasing the A1 pulley, oblique pulley was left intact.  Wound was  irrigated.  Thumb placed through full range motion, no further  triggering was noted.  The wound was then closed with interrupted 5-0  Vicryl Rapide sutures.  Local infiltration with 0.25% Marcaine without  epinephrine was given.  A sterile compressive dressing was  applied with no splint.  On deflation of the tourniquet, all fingers  immediately pinked.  She was taken to the recovery room for observation  in satisfactory condition.  She will be discharged to home to return to  the [facility] [place] in 1 week, on Vicodin.           ______________________________  Cindee Salt, M.D.     GK/MEDQ  D:  06/15/2009  T:  06/15/2009  Job:  [id number]   cc:   Gregary Signs A. Everardo All, MD

## 2011-04-05 NOTE — H&P (Signed)
   NAME:  Brittany Taylor, Brittany Taylor NO.:  [id number]   MEDICAL RECORD NO.:  [id number]                   PATIENT TYPE:  AMB   LOCATION:  SDC                                  [facility]  [facility]   PHYSICIAN:  Janine Limbo, M.D.            DATE OF BIRTH:  1956/05/08   DATE OF ADMISSION:  03/18/2003  DATE OF DISCHARGE:                                HISTORY & PHYSICAL   HISTORY OF PRESENT ILLNESS:  The patient is a 55 year old female gravida 0,  who presents for a loop electrosurgical excision procedure.  She had a low-  grade squamous intraepithelial lesion noted on her Pap smear in January  2004.  The patient then had colposcopy performed in March 2004 that was felt  to be inadequate because the transition zone could not be completely seen.  She did have the high risk human papilloma virus.  The patient had  colposcopy and biopsies many years ago and she then underwent cryotherapy.  The patient also has a history of fibroids.  She has had a myomectomy in the  past and a D&C in the past.   DRUG ALLERGIES:  MERCURY.   PAST MEDICAL HISTORY:  The patient has a history of noninsulin-dependent  diabetes.  She also has hypertension.  She also has asthma.   CURRENT MEDICATIONS:  Her current medications include Glucophage XR, Advair,  Diovan/HCT, and Singulair p.r.n.   REVIEW OF SYSTEMS:  Noncontributory.   SOCIAL HISTORY:  The patient denies cigarette use, alcohol use, and  recreational drug use.   FAMILY HISTORY:  Noncontributory.   PHYSICAL EXAMINATION:  VITAL SIGNS:  Weight is 178 pounds.  HEENT:  Within normal limits.  CHEST:  Clear.  HEART:  Regular rate and rhythm.  BREASTS:  Without masses.  ABDOMEN:  Nontender.  EXTREMITIES:  Within normal limits.  NEUROLOGIC:  Grossly normal.  PELVIC:  External genitalia is normal.  Vagina is normal.  Cervix is  nontender.  Uterus is 16 weeks size, irregular, and firm.  Adnexa no masses.   ASSESSMENT:  Low-grade  squamous intraepithelial lesion on Pap smear with  inadequate colposcopy because the transition zone could not be completely  seen (history of prior colposcopy and biopsies followed by cryotherapy).    PLAN:  The patient will undergo a loop electrosurgical excision procedure.  She understands the indications for her procedure and she accepts the  associated risks.                                               Janine Limbo, M.D.    AVS/MEDQ  D:  03/18/2003  T:  03/18/2003  Job:  [id number]

## 2011-04-05 NOTE — Op Note (Signed)
NAME:  Brittany Taylor, Brittany Taylor                         ACCOUNT NO.:  [id number]   MEDICAL RECORD NO.:  [id number]                   PATIENT TYPE:  AMB   LOCATION:  SDC                                  [facility]   PHYSICIAN:  Janine Limbo, M.D.            DATE OF BIRTH:  1956-01-06   DATE OF PROCEDURE:  03/18/2003  DATE OF DISCHARGE:                                 OPERATIVE REPORT   PREOPERATIVE DIAGNOSIS:  Vulvar squamous intraepithelial lesion on Pap smear  with inadequate colposcopy because the transition zone could not be seen  (prior cryotherapy for cervical intraepithelial neoplasia).   POSTOPERATIVE DIAGNOSIS:  Vulvar squamous intraepithelial lesion on Pap  smear with inadequate colposcopy because the transition zone could not be  seen (prior cryotherapy for cervical intraepithelial neoplasia).   PROCEDURE:  Loop electrosurgical excision procedure.   SURGEON:  Janine Limbo, M.D.   ANESTHESIA:  Local Marcaine.   INDICATIONS:  The patient is a 55 year old female, gravida 0, who presents  with the above mentioned diagnosis.  She understands the indications of her  procedure and she accepts the risks of, but not limited to, anesthetic  complications, bleeding, infections, and possible damage to the surrounding  organs.   FINDINGS:  The cervix had virtually no nonstaining areas on the ectocervix.   DESCRIPTION OF PROCEDURE:  The patient was taken to the operating room where  she was placed in the lithotomy position.  The cervix was injected with a  diluted solution of Pitressin, saline and Marcaine.  A paracervical block  was placed using 10 mL of 0.25% Marcaine.  The cervix was painted with  Lugol's solution.  There were no nonstaining areas to speak of on the  ectocervix.  The endocervical canal was sounded.  The LEEP procedure was  then performed using a medium loop.  The specimen was opened at the 8  o'clock position.  The defect in the cervix was then ablated  using the ball  apparatus to the LEEP machine.  Hemostasis was adequate.  The endocervical  canal was again sounded and was noted to be patent.  The patient was  returned to the supine position and taken to the recovery room in stable  condition.  She tolerated her procedure well.   FOLLOW UP INSTRUCTIONS:  The patient was given a prescription for Vicodin  and she will take one or two tablets every four hours as needed for pain.  She will return to see Dr. Stefano Gaul in 2-3 weeks for followup examination.  She will call for questions or concerns.  She was given a copy of the  postoperative instruction sheet as prepared the [facility]  [facility] for patient's who have undergone a dilatation and curettage but  would then modify for a LEEP procedure.  Janine Limbo, M.D.    AVS/MEDQ  D:  03/18/2003  T:  03/18/2003  Job:  [id number]

## 2011-06-17 ENCOUNTER — Other Ambulatory Visit (INDEPENDENT_AMBULATORY_CARE_PROVIDER_SITE_OTHER): Payer: BC Managed Care – PPO

## 2011-06-17 ENCOUNTER — Encounter: Payer: Self-pay | Admitting: Endocrinology

## 2011-06-17 ENCOUNTER — Ambulatory Visit (INDEPENDENT_AMBULATORY_CARE_PROVIDER_SITE_OTHER): Payer: BC Managed Care – PPO | Admitting: Endocrinology

## 2011-06-17 DIAGNOSIS — E119 Type 2 diabetes mellitus without complications: Secondary | ICD-10-CM

## 2011-06-17 DIAGNOSIS — E042 Nontoxic multinodular goiter: Secondary | ICD-10-CM

## 2011-06-17 DIAGNOSIS — Z79899 Other long term (current) drug therapy: Secondary | ICD-10-CM

## 2011-06-17 DIAGNOSIS — E785 Hyperlipidemia, unspecified: Secondary | ICD-10-CM

## 2011-06-17 LAB — BASIC METABOLIC PANEL
Chloride: 109 mEq/L (ref 96–112)
Creatinine, Ser: 0.8 mg/dL (ref 0.4–1.2)
Potassium: 4.2 mEq/L (ref 3.5–5.1)

## 2011-06-17 LAB — CBC WITH DIFFERENTIAL/PLATELET
Basophils Absolute: 0 10*3/uL (ref 0.0–0.1)
Eosinophils Absolute: 0 10*3/uL (ref 0.0–0.7)
Lymphocytes Relative: 30.2 % (ref 12.0–46.0)
MCHC: 33.6 g/dL (ref 30.0–36.0)
Neutro Abs: 1.3 10*3/uL — ABNORMAL LOW (ref 1.4–7.7)
Neutrophils Relative %: 52.4 % (ref 43.0–77.0)
Platelets: 247 10*3/uL (ref 150.0–400.0)
RDW: 15.3 % — ABNORMAL HIGH (ref 11.5–14.6)

## 2011-06-17 LAB — LIPID PANEL
Cholesterol: 160 mg/dL (ref 0–200)
LDL Cholesterol: 74 mg/dL (ref 0–99)
Triglycerides: 85 mg/dL (ref 0.0–149.0)

## 2011-06-17 LAB — HEPATIC FUNCTION PANEL
ALT: 22 U/L (ref 0–35)
AST: 25 U/L (ref 0–37)
Alkaline Phosphatase: 79 U/L (ref 39–117)
Bilirubin, Direct: 0.1 mg/dL (ref 0.0–0.3)
Total Protein: 7.2 g/dL (ref 6.0–8.3)

## 2011-06-17 LAB — TSH: TSH: 2.11 u[IU]/mL (ref 0.35–5.50)

## 2011-06-17 MED ORDER — TRIAMCINOLONE ACETONIDE 0.025 % EX CREA: TOPICAL_CREAM | Freq: Three times a day (TID) | CUTANEOUS | Status: DC

## 2011-06-17 NOTE — Patient Instructions (Addendum)
i have sent a prescription to your pharmacy, for a skin cream. blood tests are being ordered for you today.  please call [phone] to hear your test results.  You will be prompted to enter the 9-digit "MRN" number that appears at the top left of this page, followed by #.  Then you will hear the message. Please schedule an appointment for a regular physical. (update: i left message on phone-tree:  Increase metformin to 1000 mg qam)

## 2011-06-17 NOTE — Progress Notes (Signed)
Subjective:    Patient ID: Brittany Taylor, female    DOB: 12/27/1955, 55 y.o.   MRN: [id number]  HPI Pt states few mos of slight rash on the legs, and assoc itching.  With abx at urgent-care, sxs improved but did not resolve. Past Medical History  Diagnosis Date  . HSV 08/22/2007  . GOITER, MULTINODULAR 03/06/2009  . DIABETES MELLITUS, TYPE II 08/22/2007  . HYPERLIPIDEMIA 08/22/2007  . LEUKOPENIA, CHRONIC 03/06/2009  . DEPRESSION 08/22/2007  . ALLERGIC RHINITIS 08/22/2007  . TRIGGER FINGER, RIGHT THUMB 02/25/2008  . ANGIOEDEMA 08/22/2007  . ASYMPTOMATIC POSTMENOPAUSAL STATUS 03/06/2009    Past Surgical History  Procedure Date  . Myonectomy 1995    History   Social History  . Marital Status: Single    Spouse Name: N/A    Number of Children: N/A  . Years of Education: N/A   Occupational History  . RN    Social History Main Topics  . Smoking status: Never Smoker   . Smokeless tobacco: Not on file  . Alcohol Use: Not on file  . Drug Use: Not on file  . Sexually Active: Not on file   Other Topics Concern  . Not on file   Social History Narrative  . No narrative on file    Current Outpatient Prescriptions on File Prior to Visit  Medication Sig Dispense Refill  . fluocinonide (LIDEX) 0.05 % cream Apply topically 3 (three) times daily as needed.        Marland Kitchen FLUoxetine (PROZAC) 20 MG capsule 2 capsule by mouth once daily       . guanFACINE (TENEX) 1 MG tablet Take 1 mg by mouth daily.        . hydrochlorothiazide (HYDRODIURIL) 12.5 MG tablet Take 12.5 mg by mouth daily.        Marland Kitchen loratadine-pseudoephedrine (CLARITIN-D 24-HOUR) 10-240 MG per 24 hr tablet Take 1 tablet by mouth every morning.        . metFORMIN (GLUCOPHAGE-XR) 500 MG 24 hr tablet Take 500 mg by mouth daily.        . simvastatin (ZOCOR) 80 MG tablet Take 80 mg by mouth at bedtime.          Allergies  Allergen Reactions  . Morphine Itching and Nausea And Vomiting  . Ramipril     REACTION: Angioedema    Family  History  Problem Relation Age of Onset  . Cancer Neg Hx     BP 128/80  Pulse 79  Temp(Src) 98 F (36.7 C) (Oral)  Ht 5\' 4"  (1.626 m)  Wt 194 lb 12.8 oz (88.361 kg)  BMI 33.44 kg/m2  SpO2 97%    Review of Systems Denies chest pain and sob    Objective:   Physical Exam GENERAL: no distress Legs: mild eczematous rash. Pulses: dorsalis pedis intact bilat.   Feet: no deformity.  no ulcer on the feet.  feet are of normal color and temp.  no edema Neuro: sensation is intact to touch on the feet   Lab Results  Component Value Date   WBC 2.5 Repeated and verified X2.* 06/17/2011   HGB 12.5 06/17/2011   HCT 37.2 06/17/2011   PLT 247.0 06/17/2011   CHOL 160 06/17/2011   TRIG 85.0 06/17/2011   HDL 68.60 06/17/2011   ALT 22 06/17/2011   AST 25 06/17/2011   NA 143 06/17/2011   K 4.2 06/17/2011   CL 109 06/17/2011   CREATININE 0.8 06/17/2011   BUN 16 06/17/2011  CO2 30 06/17/2011   TSH 2.11 06/17/2011   HGBA1C 7.1* 06/17/2011   MICROALBUR 0.5 06/17/2011      Assessment & Plan:  Abnormal ecg, unchanged. Rash, new.  mild.  uncertain etiology. Dyslipidemia, well-controlled

## 2011-06-19 MED ORDER — METFORMIN HCL ER 500 MG PO TB24: 1000.0000 mg | ORAL_TABLET | Freq: Every day | ORAL | Status: DC

## 2011-08-23 LAB — GLUCOSE, CAPILLARY: Glucose-Capillary: 115 mg/dL — ABNORMAL HIGH (ref 70–99)

## 2011-08-23 LAB — BASIC METABOLIC PANEL
BUN: 14 mg/dL (ref 6–23)
Chloride: 101 mEq/L (ref 96–112)
Creatinine, Ser: 0.64 mg/dL (ref 0.4–1.2)
GFR calc non Af Amer: >60 mL/min (ref >60)
Glucose, Bld: 99 mg/dL (ref 70–99)
Potassium: 3.9 mEq/L (ref 3.5–5.1)

## 2011-09-20 ENCOUNTER — Other Ambulatory Visit (HOSPITAL_COMMUNITY): Payer: Self-pay | Admitting: Obstetrics and Gynecology

## 2011-09-20 DIAGNOSIS — Z1231 Encounter for screening mammogram for malignant neoplasm of breast: Secondary | ICD-10-CM

## 2011-10-17 ENCOUNTER — Ambulatory Visit (HOSPITAL_COMMUNITY)
Admission: RE | Admit: 2011-10-17 | Discharge: 2011-10-17 | Disposition: A | Discharge status: Home / Self Care | Payer: BC Managed Care – PPO | Source: Ambulatory Visit | Attending: Obstetrics and Gynecology | Admitting: Obstetrics and Gynecology

## 2011-10-17 DIAGNOSIS — Z1231 Encounter for screening mammogram for malignant neoplasm of breast: Secondary | ICD-10-CM | POA: Insufficient documentation

## 2011-12-20 ENCOUNTER — Other Ambulatory Visit: Payer: Self-pay | Admitting: Endocrinology

## 2012-01-15 ENCOUNTER — Encounter: Payer: Self-pay | Admitting: Internal Medicine

## 2012-01-15 ENCOUNTER — Ambulatory Visit (INDEPENDENT_AMBULATORY_CARE_PROVIDER_SITE_OTHER): Payer: BC Managed Care – PPO | Admitting: Internal Medicine

## 2012-01-15 VITALS — BP 132/90 | HR 87 | Temp 98.8°F | Ht 65.0 in | Wt 190.8 lb

## 2012-01-15 DIAGNOSIS — J0100 Acute maxillary sinusitis, unspecified: Secondary | ICD-10-CM

## 2012-01-15 DIAGNOSIS — R21 Rash and other nonspecific skin eruption: Secondary | ICD-10-CM

## 2012-01-15 DIAGNOSIS — J45909 Unspecified asthma, uncomplicated: Secondary | ICD-10-CM

## 2012-01-15 MED ORDER — LORATADINE-PSEUDOEPHEDRINE ER 5-120 MG PO TB12: 1.0000 | ORAL_TABLET | ORAL | Status: DC

## 2012-01-15 MED ORDER — OXYMETAZOLINE HCL 0.05 % NA SOLN: 2.0000 | Freq: Two times a day (BID) | NASAL | Status: AC

## 2012-01-15 MED ORDER — AMOXICILLIN-POT CLAVULANATE 875-125 MG PO TABS: 1.0000 | ORAL_TABLET | Freq: Two times a day (BID) | ORAL | Status: AC

## 2012-01-15 NOTE — Progress Notes (Signed)
  Subjective:   HPI  complains of head cold symptoms  Onset >1 week ago, wax/wane symptoms  Initially associated with rhinorrhea, sneezing, sore throat, mild headache and low grade fever Now with sinus pressure and mild-mod chest congestion> nocturnal cough No relief with OTC meds Precipitated by sick contacts  Past Medical History  Diagnosis Date  . HSV   . GOITER, MULTINODULAR   . DIABETES MELLITUS, TYPE II   . HYPERLIPIDEMIA   . LEUKOPENIA, CHRONIC   . DEPRESSION   . ALLERGIC RHINITIS   . TRIGGER FINGER, RIGHT THUMB   . ANGIOEDEMA   . ASYMPTOMATIC POSTMENOPAUSAL STATUS     Review of Systems Constitutional: No night sweats, no unexpected weight change Pulmonary: No pleurisy or hemoptysis Cardiovascular: No chest pain or palpitations     Objective:   Physical Exam BP 132/90  Pulse 87  Temp(Src) 98.8 F (37.1 C) (Oral)  Ht 5\' 5"  (1.651 m)  Wt 190 lb 12.8 oz (86.546 kg)  BMI 31.75 kg/m2  SpO2 95% GEN: mildly ill appearing and audible head/nasal congestion HENT: NCAT, mild max sinus tenderness bilaterally, nares swollen turbinates but without discharge, oropharynx mild erythema and  PND, no exudate Eyes: Vision grossly intact, no conjunctivitis Lungs: Clear to auscultation without rhonchi or wheeze, no increased work of breathing Cardiovascular: Regular rate and rhythm, no bilateral edema      Assessment & Plan:  Viral URI >>maxillary sinusitis Cough, postnasal drip related to above Hx asthma, no current flare Rash RLE, ?NOS   Empiric antibiotics prescribed due to symptom duration greater than 7 days and comorbid dz Symptomatic care with Tylenol or Advil, decongestants, hydration and rest -  salt gargle advised as needed Refer to derm for leg rash - unimproved with topical steroids from allergist (sharma)

## 2012-01-15 NOTE — Patient Instructions (Signed)
It was good to see you today. Augmentin antibiotics for sinus infection symptoms - Your prescription(s) have been submitted to your pharmacy. Please take as directed and contact our office if you believe you are having problem(s) with the medication(s). Also use Claritin-D 12 hour each a.m. and Afrin nasal spray twice a day for 5 days -  Alternate between ibuprofen and tylenol for aches, pain and fever symptoms as discussed Hydrate, rest and call us if symptoms are unimproved in next 7-10 days, sooner if worse we'll make referral to dermatology for the rash evaluation as requested . Our office will contact you regarding appointment(s) once made.

## 2012-03-05 ENCOUNTER — Other Ambulatory Visit: Payer: Self-pay | Admitting: Endocrinology

## 2012-05-07 ENCOUNTER — Other Ambulatory Visit: Payer: Self-pay | Admitting: Endocrinology

## 2012-07-14 ENCOUNTER — Other Ambulatory Visit (INDEPENDENT_AMBULATORY_CARE_PROVIDER_SITE_OTHER): Payer: BC Managed Care – PPO

## 2012-07-14 ENCOUNTER — Encounter: Payer: Self-pay | Admitting: Endocrinology

## 2012-07-14 ENCOUNTER — Ambulatory Visit (INDEPENDENT_AMBULATORY_CARE_PROVIDER_SITE_OTHER): Payer: BC Managed Care – PPO | Admitting: Endocrinology

## 2012-07-14 VITALS — BP 122/82 | HR 70 | Temp 98.4°F | Ht 65.0 in | Wt 189.0 lb

## 2012-07-14 DIAGNOSIS — Z23 Encounter for immunization: Secondary | ICD-10-CM

## 2012-07-14 DIAGNOSIS — E042 Nontoxic multinodular goiter: Secondary | ICD-10-CM

## 2012-07-14 DIAGNOSIS — E785 Hyperlipidemia, unspecified: Secondary | ICD-10-CM

## 2012-07-14 DIAGNOSIS — E119 Type 2 diabetes mellitus without complications: Secondary | ICD-10-CM

## 2012-07-14 DIAGNOSIS — N951 Menopausal and female climacteric states: Secondary | ICD-10-CM

## 2012-07-14 DIAGNOSIS — L408 Other psoriasis: Secondary | ICD-10-CM

## 2012-07-14 DIAGNOSIS — D72819 Decreased white blood cell count, unspecified: Secondary | ICD-10-CM

## 2012-07-14 DIAGNOSIS — L409 Psoriasis, unspecified: Secondary | ICD-10-CM

## 2012-07-14 DIAGNOSIS — Z79899 Other long term (current) drug therapy: Secondary | ICD-10-CM

## 2012-07-14 LAB — CBC WITH DIFFERENTIAL/PLATELET
Basophils Absolute: 0 10*3/uL (ref 0.0–0.1)
Lymphocytes Relative: 33.4 % (ref 12.0–46.0)
Lymphs Abs: 0.9 10*3/uL (ref 0.7–4.0)
Monocytes Relative: 13.9 % — ABNORMAL HIGH (ref 3.0–12.0)
Neutrophils Relative %: 49.2 % (ref 43.0–77.0)
Platelets: 290 10*3/uL (ref 150.0–400.0)
RDW: 14.5 % (ref 11.5–14.6)

## 2012-07-14 LAB — HEMOGLOBIN A1C: Hgb A1c MFr Bld: 6.3 % (ref 4.6–6.5)

## 2012-07-14 LAB — HEPATIC FUNCTION PANEL
AST: 26 U/L (ref 0–37)
Albumin: 4.1 g/dL (ref 3.5–5.2)
Alkaline Phosphatase: 82 U/L (ref 39–117)
Total Protein: 7.6 g/dL (ref 6.0–8.3)

## 2012-07-14 LAB — BASIC METABOLIC PANEL
BUN: 14 mg/dL (ref 6–23)
Creatinine, Ser: 0.7 mg/dL (ref 0.4–1.2)
GFR: 104.43 mL/min (ref >60.00)
Potassium: 3.8 mEq/L (ref 3.5–5.1)

## 2012-07-14 LAB — MICROALBUMIN / CREATININE URINE RATIO
Creatinine,U: 119.2 mg/dL
Microalb, Ur: 2.3 mg/dL — ABNORMAL HIGH (ref 0.0–1.9)

## 2012-07-14 LAB — URINALYSIS, ROUTINE W REFLEX MICROSCOPIC
Bilirubin Urine: NEGATIVE
Leukocytes, UA: NEGATIVE
Nitrite: NEGATIVE
Specific Gravity, Urine: 1.02 (ref 1.000–1.030)
pH: 6 (ref 5.0–8.0)

## 2012-07-14 LAB — LIPID PANEL: VLDL: 11.2 mg/dL (ref 0.0–40.0)

## 2012-07-14 NOTE — Progress Notes (Signed)
Subjective:    Patient ID: Brittany Taylor, female    DOB: February 02, 1956, 56 y.o.   MRN: [id number]  HPI here for regular wellness examination.  He's feeling pretty well in general, and says chronic med probs are stable, except as noted below Past Medical History  Diagnosis Date  . HSV   . GOITER, MULTINODULAR   . DIABETES MELLITUS, TYPE II   . HYPERLIPIDEMIA   . LEUKOPENIA, CHRONIC   . DEPRESSION   . ALLERGIC RHINITIS   . TRIGGER FINGER, RIGHT THUMB   . ANGIOEDEMA   . ASYMPTOMATIC POSTMENOPAUSAL STATUS     Past Surgical History  Procedure Date  . Myonectomy 1995    History   Social History  . Marital Status: Single    Spouse Name: N/A    Number of Children: N/A  . Years of Education: N/A   Occupational History  . RN    Social History Main Topics  . Smoking status: Never Smoker   . Smokeless tobacco: Not on file  . Alcohol Use: Not on file  . Drug Use: Not on file  . Sexually Active: Not on file   Other Topics Concern  . Not on file   Social History Narrative  . No narrative on file    Current Outpatient Prescriptions on File Prior to Visit  Medication Sig Dispense Refill  . budesonide-formoterol (SYMBICORT) 160-4.5 MCG/ACT inhaler Inhale into the lungs. 1-2 puffs two times a day       . Cetirizine HCl 10 MG CAPS Take 1 capsule by mouth daily.      . fluocinonide (LIDEX) 0.05 % cream Apply topically 3 (three) times daily as needed.        Marland Kitchen FLUoxetine (PROZAC) 20 MG capsule TAKE TWO CAPSULES BY MOUTH EVERY DAY  180 capsule  1  . guanFACINE (TENEX) 1 MG tablet TAKE ONE TABLET BY MOUTH EVERY DAY  90 tablet  2  . hydrochlorothiazide (MICROZIDE) 12.5 MG capsule TAKE ONE CAPSULE BY MOUTH EVERY DAY  90 capsule  0  . metFORMIN (GLUCOPHAGE-XR) 500 MG 24 hr tablet Take 2 tablets (1,000 mg total) by mouth daily with breakfast.  180 tablet  3  . NON FORMULARY Allergy shots       . simvastatin (ZOCOR) 80 MG tablet Take 80 mg by mouth at bedtime.        Marland Kitchen DISCONTD:  loratadine-pseudoephedrine (CLARITIN-D 12 HOUR) 5-120 MG per tablet Take 1 tablet by mouth every morning.        Allergies  Allergen Reactions  . Morphine Itching and Nausea And Vomiting  . Ramipril     REACTION: Angioedema    Family History  Problem Relation Age of Onset  . Cancer Neg Hx    BP 122/82  Pulse 70  Temp 98.4 F (36.9 C) (Oral)  Ht 5\' 5"  (1.651 m)  Wt 189 lb (85.73 kg)  BMI 31.45 kg/m2  SpO2 98%  Review of Systems  HENT: Negative for hearing loss.   Eyes: Negative for visual disturbance.  Respiratory: Negative for shortness of breath.   Cardiovascular: Negative for chest pain.  Gastrointestinal: Negative for abdominal pain.  Genitourinary: Negative for hematuria.  Musculoskeletal: Negative for back pain.  Skin: Negative for wound.  Neurological: Negative for syncope, numbness and headaches.  Hematological: Does not bruise/bleed easily.  Psychiatric/Behavioral: Negative for dysphoric mood.      Objective:   Physical Exam VS: see vs page GEN: no distress HEAD: head: no deformity eyes: no  periorbital swelling, no proptosis external nose and ears are normal mouth: no lesion seen NECK: supple, thyroid is not enlarged CHEST WALL: no deformity LUNGS:  Clear to auscultation BREASTS:  sees gyn CV: reg rate and rhythm, no murmur ABD: abdomen is soft, nontender.  no hepatosplenomegaly.  not distended.  no hernia.  Old healed surgical scar. GENITALIA/RECTAL: sees gyn MUSCULOSKELETAL: muscle bulk and strength are grossly normal.  no obvious joint swelling.  gait is normal and steady EXTEMITIES: no deformity.  no ulcer on the feet.  feet are of normal color and temp.  no edema PULSES: dorsalis pedis intact bilat.  no carotid bruit NEURO:  cn 2-12 grossly intact.   readily moves all 4's.  sensation is intact to touch on the feet. SKIN:  Normal texture and temperature.  Psoriatic plaques on the legs. NODES:  None palpable at the neck PSYCH: alert, oriented x3.   Does not appear anxious nor depressed.        Assessment & Plan:  Wellness visit today, with problems stable, except as noted.

## 2012-07-14 NOTE — Patient Instructions (Addendum)
please consider these measures for your health:  minimize alcohol.  do not use tobacco products.  have a colonoscopy at least every 10 years from age 56.  Women should have an annual mammogram from age 67.  keep firearms safely stored.  always use seat belts.  have working smoke alarms in your home.  see an eye doctor and dentist regularly.  never drive under the influence of alcohol or drugs (including prescription drugs).  Please come back for a follow-up appointment in 6 months.   Please schedule your bone-density test.

## 2012-07-15 ENCOUNTER — Ambulatory Visit (INDEPENDENT_AMBULATORY_CARE_PROVIDER_SITE_OTHER)
Admission: RE | Admit: 2012-07-15 | Discharge: 2012-07-15 | Disposition: A | Discharge status: Home / Self Care | Payer: BC Managed Care – PPO | Source: Ambulatory Visit

## 2012-07-15 ENCOUNTER — Telehealth: Payer: Self-pay | Admitting: *Deleted

## 2012-07-15 DIAGNOSIS — N951 Menopausal and female climacteric states: Secondary | ICD-10-CM

## 2012-07-15 NOTE — Telephone Encounter (Signed)
Called pt to inform of lab results, pt informed (letter also mailed to pt). 

## 2012-07-21 ENCOUNTER — Encounter: Payer: Self-pay | Admitting: Endocrinology

## 2012-08-06 ENCOUNTER — Other Ambulatory Visit: Payer: Self-pay | Admitting: Endocrinology

## 2012-09-25 ENCOUNTER — Other Ambulatory Visit: Payer: Self-pay | Admitting: Endocrinology

## 2012-09-25 ENCOUNTER — Other Ambulatory Visit: Payer: Self-pay | Admitting: *Deleted

## 2012-09-25 MED ORDER — SIMVASTATIN 80 MG PO TABS: 80.0000 mg | ORAL_TABLET | Freq: Every day | ORAL | Status: DC

## 2012-09-25 NOTE — Telephone Encounter (Signed)
Medication refill request.

## 2012-10-20 ENCOUNTER — Other Ambulatory Visit: Payer: Self-pay | Admitting: Obstetrics and Gynecology

## 2012-10-20 DIAGNOSIS — Z1231 Encounter for screening mammogram for malignant neoplasm of breast: Secondary | ICD-10-CM

## 2012-11-05 ENCOUNTER — Ambulatory Visit (HOSPITAL_COMMUNITY)
Admission: RE | Admit: 2012-11-05 | Discharge: 2012-11-05 | Disposition: A | Discharge status: Home / Self Care | Payer: BC Managed Care – PPO | Source: Ambulatory Visit | Attending: Obstetrics and Gynecology | Admitting: Obstetrics and Gynecology

## 2012-11-05 DIAGNOSIS — Z1231 Encounter for screening mammogram for malignant neoplasm of breast: Secondary | ICD-10-CM | POA: Insufficient documentation

## 2012-12-15 ENCOUNTER — Encounter: Payer: Self-pay | Admitting: Internal Medicine

## 2012-12-15 ENCOUNTER — Telehealth: Payer: Self-pay | Admitting: Endocrinology

## 2012-12-15 ENCOUNTER — Ambulatory Visit (INDEPENDENT_AMBULATORY_CARE_PROVIDER_SITE_OTHER): Payer: BC Managed Care – PPO | Admitting: Internal Medicine

## 2012-12-15 VITALS — BP 122/72 | HR 93 | Temp 97.9°F | Ht 65.0 in | Wt 195.5 lb

## 2012-12-15 DIAGNOSIS — M7051 Other bursitis of knee, right knee: Secondary | ICD-10-CM

## 2012-12-15 DIAGNOSIS — IMO0002 Reserved for concepts with insufficient information to code with codable children: Secondary | ICD-10-CM

## 2012-12-15 MED ORDER — DICLOFENAC SODIUM 75 MG PO TBEC: 75.0000 mg | DELAYED_RELEASE_TABLET | Freq: Two times a day (BID) | ORAL | Status: DC

## 2012-12-15 MED ORDER — METHYLPREDNISOLONE ACETATE 80 MG/ML IJ SUSP
80.0000 mg | Freq: Once | INTRAMUSCULAR | Status: AC
  Administered 2012-12-15: 80 mg via INTRAMUSCULAR

## 2012-12-15 NOTE — Progress Notes (Signed)
Subjective:    Patient ID: Brittany Taylor, female    DOB: [birth year], 57 y.o.   MRN: [id number]  HPI  Pt presents to the clinic today with c/o right knee pain. She tripped over a parking bumper in the parking lot approximately 1 month ago. She fell on her right knee. She did not get treatment at that time. She has noticed pain and swelling mostly in the morning and evenings. She did take 800 mg ibuprofen which did help.   Review of Systems     Past Medical History  Diagnosis Date  . HSV   . GOITER, MULTINODULAR   . DIABETES MELLITUS, TYPE II   . HYPERLIPIDEMIA   . LEUKOPENIA, CHRONIC   . DEPRESSION   . ALLERGIC RHINITIS   . TRIGGER FINGER, RIGHT THUMB   . ANGIOEDEMA   . ASYMPTOMATIC POSTMENOPAUSAL STATUS     Current Outpatient Prescriptions  Medication Sig Dispense Refill  . aspirin 81 MG tablet Take 81 mg by mouth daily.      Marland Kitchen azelastine (OPTIVAR) 0.05 % ophthalmic solution       . budesonide-formoterol (SYMBICORT) 160-4.5 MCG/ACT inhaler Inhale into the lungs. 1-2 puffs two times a day       . calcipotriene (DOVONOX) 0.005 % ointment       . Cetirizine HCl 10 MG CAPS Take 1 capsule by mouth daily.      . clobetasol cream (TEMOVATE) 0.05 % Apply topically 2 (two) times daily as needed.      . desonide (DESOWEN) 0.05 % cream Apply topically 2 (two) times daily. To face and ears      . fluocinonide (LIDEX) 0.05 % cream Apply topically 3 (three) times daily as needed.        Marland Kitchen FLUoxetine (PROZAC) 20 MG capsule TAKE TWO CAPSULES BY MOUTH EVERY DAY  180 capsule  3  . guanFACINE (TENEX) 1 MG tablet TAKE ONE TABLET BY MOUTH EVERY DAY  90 tablet  2  . hydrochlorothiazide (MICROZIDE) 12.5 MG capsule TAKE ONE CAPSULE BY MOUTH EVERY DAY  90 capsule  3  . loratadine-pseudoephedrine (CLARITIN-D 12-HOUR) 5-120 MG per tablet Take 1 tablet by mouth as needed.      . metFORMIN (GLUCOPHAGE-XR) 500 MG 24 hr tablet TAKE TWO TABLETS BY MOUTH EVERY DAY WITH BREAKFAST  180 tablet  3  . NON  FORMULARY Allergy shots       . simvastatin (ZOCOR) 80 MG tablet Take 1 tablet (80 mg total) by mouth at bedtime.  90 tablet  1  . tacrolimus (PROTOPIC) 0.03 % ointment Apply topically 2 (two) times daily.        Allergies  Allergen Reactions  . Morphine Itching and Nausea And Vomiting  . Ramipril     REACTION: Angioedema    Family History  Problem Relation Age of Onset  . Cancer Neg Hx     History   Social History  . Marital Status: Single    Spouse Name: N/A    Number of Children: N/A  . Years of Education: N/A   Occupational History  . RN    Social History Main Topics  . Smoking status: Never Smoker   . Smokeless tobacco: Not on file  . Alcohol Use: Not on file  . Drug Use: Not on file  . Sexually Active: Not on file   Other Topics Concern  . Not on file   Social History Narrative  . No narrative on file  Constitutional: Denies fever, malaise, fatigue, headache or abrupt weight changes.  Musculoskeletal: Pt reports right knee pain. Denies decrease in range of motion, difficulty with gait, muscle pain.  No other specific complaints in a complete review of systems (except as listed in HPI above).  Objective:   Physical Exam   BP 122/72  Pulse 93  Temp 97.9 F (36.6 C) (Oral)  Ht 5\' 5"  (1.651 m)  Wt 195 lb 8 oz (88.678 kg)  BMI 32.53 kg/m2  SpO2 97% Wt Readings from Last 3 Encounters:  12/15/12 195 lb 8 oz (88.678 kg)  07/14/12 189 lb (85.73 kg)  01/15/12 190 lb 12.8 oz (86.546 kg)    General: Appears her stated age, well developed, well nourished in NAD. Cardiovascular: Normal rate and rhythm. S1,S2 noted.  No murmur, rubs or gallops noted. No JVD or BLE edema. No carotid bruits noted. Pulmonary/Chest: Normal effort and positive vesicular breath sounds. No respiratory distress. No wheezes, rales or ronchi noted.  Musculoskeletal: Normal range of motion. Mild swelling.  Pinpoint tenderness at the pes bursa. No difficulty with gait.          Assessment & Plan:  Right Knee pain, likely due to pes bursitis, new onset with additional workup required:  eRx for Diclofenac 75 mg BID Depo Medrol IM today Do stretching exercises as indicated on handout  RTC as needed or if symptoms persist

## 2012-12-15 NOTE — Telephone Encounter (Signed)
[facility] Triage Call Report Triage Record Num: [id number] Operator: Jeraldine Loots Patient Name: Brittany Taylor Call Date & Time: 12/14/2012 5:30:12PM Patient Phone: [phone] PCP: Romero Belling Patient Gender: Female PCP Fax : [phone] Patient DOB: 10/27/1956 [facility] Name: [facility] Reason for Call: Caller: Frank/Patient; PCP: Other; CB#: [phone]; She was walking on the sidewalk and fell into the curb. Fell onto the right knee. Continues to have pain in the knee and will radiate into the hip. This happened 12/31. She walks with a limp at times. Triaged per Knee Injury. Needs to be seen in 24 hours. Scheduled with Ba[facility]. Home care for tonight. Has only been taking 200mg  Motrin rarely. Protocol(s) Used: Knee Injury Recommended Outcome per Protocol: See Provider within 24 hours Reason for Outcome: New or worsening swelling or pain for at least 24 hours that has not improved with home care Care Advice: ~ SYMPTOM / CONDITION MANAGEMENt

## 2012-12-15 NOTE — Patient Instructions (Signed)
Bursitis Bursitis is inflammation of a bursa. A bursa is a soft, fluid-filled sac. It cushions the soft tissue around a bone. Bursitis often occurs in the bursas near the shoulders, elbows, knees, pelvis, hips, heel, and Achilles tendon.   SYMPTOMS    Pain and tenderness in the affected area. Sometimes, pain radiates into surrounding areas. Specifically, pain with movement.   Limited range of motion of the affected joint.   Sometimes, painless swelling of the bursa.   Fever (when infected).  CAUSES    Injury to a joint or bursa.   Overuse or strenuous exercise of a joint.   Gout (disease with inflamed joints).   Prolonged pressure on a joint containing bursas (resting on an elbow or kneeling).   Arthritis.   Acute or chronic infection.   Calcium deposits in shoulder tendons, with degeneration of the tendon.  RISK INCREASES WITH:  Vigorous, repeated, or sudden increase in athletic training or activity level.   Failure to warm-up properly.   Overstretching.   Improper exercise technique.   Playing sports on [employer].  PREVENTION  Avoid injuries or overuse of muscles.   Warm-up and cool down properly. Do this before and after physical activity.   Maintain proper conditioning:   Joint flexibility.   Muscle strength and endurance.   Cardiovascular fitness.   Learn and use proper technique.   Wear protective equipment.  PROGNOSIS   With proper treatment, symptoms often go away within 7 to 14 days.   RELATED COMPLICATIONS    Frequent recurrence of symptoms. This can result in a chronic, repetitive problem.   Joint stiffness.   Limited joint movement.   Infection of bursa.   Chronic inflammation or scarring of bursa.  TREATMENT Treatment first involves protecting and resting the bursa and its joint. You may use ice or an elastic bandage to reduce inflammation. Anti-inflammatory medicines may help resolve the swelling. If symptoms persist despite treatment,  a caregiver may withdraw fluid from the bursa. They might also consider a corticosteroid injection. Sometimes, bursitis will persist in spite of non-surgical treatment or will become infected. These cases may require removal (surgical excision) of the bursa.   MEDICATION    If pain medicine is needed, nonsteroidal anti-inflammatory medicines, such as aspirin and ibuprofen, or other minor pain relievers, such as acetaminophen, are often recommended.   Do not take pain medicine for 7 days before surgery.   Prescription pain relievers are usually only prescribed after surgery. Use only as directed and only as much as you need.   Ointments applied to the skin may be helpful.   Corticosteroid injections may be given. This is done to reduce inflammation in the bursa.  HEAT AND COLD:  Cold treatment (icing) relieves pain and reduces inflammation. Cold treatment should be applied for 10 to 15 minutes every 2 to 3 hours for inflammation and pain, and immediately after any activity that aggravates your symptoms. Use ice packs or an ice massage.   Heat treatment may be used prior to performing the stretching and strengthening activities prescribed by your caregiver, physical therapist, or athletic trainer. Use a heat pack or a warm soak.  SEEK MEDICAL CARE IF:    Symptoms get worse or do not improve in 2 weeks, despite treatment.   New, unexplained symptoms develop. (Drugs used in treatment may produce side effects.)  Document Released: 11/04/2005 Document Revised: 01/27/2012 Document Reviewed: 02/16/2009 [facility] Patient Information 2013 [place], [place].

## 2012-12-15 NOTE — Telephone Encounter (Signed)
Patient saw Brittany Taylor 12/15/12

## 2012-12-15 NOTE — Addendum Note (Signed)
Addended by: Carin Primrose on: 12/15/2012 02:55 PM   Modules accepted: Orders

## 2013-01-17 ENCOUNTER — Other Ambulatory Visit: Payer: Self-pay | Admitting: Endocrinology

## 2013-02-05 ENCOUNTER — Emergency Department (INDEPENDENT_AMBULATORY_CARE_PROVIDER_SITE_OTHER)
Admission: EM | Admit: 2013-02-05 | Discharge: 2013-02-05 | Disposition: A | Discharge status: Home / Self Care | Payer: Self-pay | Source: Home / Self Care | Attending: Family Medicine | Admitting: Family Medicine

## 2013-02-05 ENCOUNTER — Encounter (HOSPITAL_COMMUNITY): Payer: Self-pay | Admitting: Emergency Medicine

## 2013-02-05 DIAGNOSIS — M542 Cervicalgia: Secondary | ICD-10-CM

## 2013-02-05 DIAGNOSIS — IMO0001 Reserved for inherently not codable concepts without codable children: Secondary | ICD-10-CM

## 2013-02-05 HISTORY — DX: Psoriasis, unspecified: L40.9

## 2013-02-05 MED ORDER — METAXALONE 800 MG PO TABS
800.0000 mg | ORAL_TABLET | Freq: Three times a day (TID) | ORAL | Status: DC
Start: 2013-02-05 — End: 2015-02-07
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
## 2013-03-23 ENCOUNTER — Ambulatory Visit (INDEPENDENT_AMBULATORY_CARE_PROVIDER_SITE_OTHER): Payer: Self-pay | Admitting: Endocrinology

## 2013-03-23 ENCOUNTER — Encounter: Payer: Self-pay | Admitting: Endocrinology

## 2013-03-23 VITALS — BP 122/78 | HR 78 | Wt 194.0 lb

## 2013-03-23 DIAGNOSIS — E042 Nontoxic multinodular goiter: Secondary | ICD-10-CM

## 2013-03-23 DIAGNOSIS — Z79899 Other long term (current) drug therapy: Secondary | ICD-10-CM

## 2013-03-23 DIAGNOSIS — E119 Type 2 diabetes mellitus without complications: Secondary | ICD-10-CM

## 2013-03-23 DIAGNOSIS — D72819 Decreased white blood cell count, unspecified: Secondary | ICD-10-CM

## 2013-03-23 DIAGNOSIS — E785 Hyperlipidemia, unspecified: Secondary | ICD-10-CM

## 2013-03-23 MED ORDER — METFORMIN HCL ER 500 MG PO TB24: 1000.0000 mg | ORAL_TABLET | Freq: Every day | ORAL | Status: DC

## 2013-03-23 NOTE — Progress Notes (Signed)
Subjective:    Patient ID: Brittany Taylor, female    DOB: 1956/02/03, 57 y.o.   MRN: [id number]  HPI Pt returns for f/u of type 2 DM (dx'ed 2004; no known complications).  pt states she feels well in general.  no cbg record, but states cbg's are well-controlled Past Medical History  Diagnosis Date  . HSV   . GOITER, MULTINODULAR   . DIABETES MELLITUS, TYPE II   . HYPERLIPIDEMIA   . LEUKOPENIA, CHRONIC   . DEPRESSION   . ALLERGIC RHINITIS   . TRIGGER FINGER, RIGHT THUMB   . ANGIOEDEMA   . ASYMPTOMATIC POSTMENOPAUSAL STATUS   . Psoriasis     Past Surgical History  Procedure Laterality Date  . Myonectomy  1995  . Leep    . Thumb surgery      History   Social History  . Marital Status: Single    Spouse Name: N/A    Number of Children: N/A  . Years of Education: N/A   Occupational History  . RN    Social History Main Topics  . Smoking status: Never Smoker   . Smokeless tobacco: Not on file  . Alcohol Use: No  . Drug Use: Not on file  . Sexually Active: Not on file   Other Topics Concern  . Not on file   Social History Narrative  . No narrative on file    Current Outpatient Prescriptions on File Prior to Visit  Medication Sig Dispense Refill  . aspirin 81 MG tablet Take 81 mg by mouth daily.      Marland Kitchen azelastine (OPTIVAR) 0.05 % ophthalmic solution       . budesonide-formoterol (SYMBICORT) 160-4.5 MCG/ACT inhaler Inhale into the lungs. 1-2 puffs two times a day       . calcipotriene (DOVONOX) 0.005 % ointment       . Cetirizine HCl 10 MG CAPS Take 1 capsule by mouth daily.      . clobetasol cream (TEMOVATE) 0.05 % Apply topically 2 (two) times daily as needed.      . desonide (DESOWEN) 0.05 % cream Apply topically 2 (two) times daily. To face and ears      . diclofenac (VOLTAREN) 75 MG EC tablet Take 1 tablet (75 mg total) by mouth 2 (two) times daily.  30 tablet  0  . fluocinonide (LIDEX) 0.05 % cream Apply topically 3 (three) times daily as needed.        Marland Kitchen  FLUoxetine (PROZAC) 20 MG capsule TAKE TWO CAPSULES BY MOUTH EVERY DAY  180 capsule  3  . guanFACINE (TENEX) 1 MG tablet TAKE ONE TABLET BY MOUTH EVERY DAY  90 tablet  0  . hydrochlorothiazide (MICROZIDE) 12.5 MG capsule TAKE ONE CAPSULE BY MOUTH EVERY DAY  90 capsule  3  . metaxalone (SKELAXIN) 800 MG tablet Take 1 tablet (800 mg total) by mouth 3 (three) times daily. As muscle relaxer  21 tablet  0  . metFORMIN (GLUCOPHAGE-XR) 500 MG 24 hr tablet TAKE TWO TABLETS BY MOUTH EVERY DAY WITH BREAKFAST  180 tablet  3  . NON FORMULARY Allergy shots       . simvastatin (ZOCOR) 80 MG tablet Take 1 tablet (80 mg total) by mouth at bedtime.  90 tablet  1  . tacrolimus (PROTOPIC) 0.03 % ointment Apply topically 2 (two) times daily.      Marland Kitchen loratadine-pseudoephedrine (CLARITIN-D 12-HOUR) 5-120 MG per tablet Take 1 tablet by mouth as needed.  No current facility-administered medications on file prior to visit.    Allergies  Allergen Reactions  . Morphine Itching and Nausea And Vomiting  . Ramipril     REACTION: Angioedema    Family History  Problem Relation Age of Onset  . Cancer Neg Hx     BP 122/78  Pulse 78  Wt 194 lb (87.998 kg)  BMI 32.28 kg/m2  SpO2 98%  Review of Systems Denies weight change    Objective:   Physical Exam VITAL SIGNS:  See vs page GENERAL: no distress      Assessment & Plan:  DM: apparently well-controlled.

## 2013-03-23 NOTE — Patient Instructions (Addendum)
good diet and exercise habits significanly improve the control of your diabetes.  please let me know if you wish to be referred to a dietician.  high blood sugar is very risky to your health.  you should see an eye doctor every year.  You are at higher than average risk for pneumonia and hepatitis-B.  You should be vaccinated against both.    controlling your blood pressure and cholesterol drastically reduces the damage diabetes does to your body.  this also applies to quitting smoking.  please discuss these with your doctor.  you should take an aspirin every day, unless you have been advised by a doctor not to.   check your blood sugar once a day.  vary the time of day when you check, between before the 3 meals, and at bedtime.  also check if you have symptoms of your blood sugar being too high or too low.  please keep a record of the readings and bring it to your next appointment here.  please call us sooner if your blood sugar goes below 70, or if you have a lot of readings over 200.   Please come in for a regular physical when you are sure you are covered by your new insurance.  Please do blood tests here a few days ahead of that date.

## 2013-05-07 ENCOUNTER — Other Ambulatory Visit: Payer: Self-pay | Admitting: *Deleted

## 2013-05-07 MED ORDER — GUANFACINE HCL 1 MG PO TABS: 1.0000 mg | ORAL_TABLET | Freq: Every day | ORAL | Status: DC

## 2013-06-11 ENCOUNTER — Telehealth: Payer: Self-pay | Admitting: Endocrinology

## 2013-06-11 MED ORDER — METFORMIN HCL ER 500 MG PO TB24: 1000.0000 mg | ORAL_TABLET | Freq: Every day | ORAL | Status: DC

## 2013-06-11 NOTE — Telephone Encounter (Signed)
[address], needs Glucophjage refill called in. / Sherri

## 2013-06-15 ENCOUNTER — Other Ambulatory Visit: Payer: Self-pay | Admitting: *Deleted

## 2013-06-15 MED ORDER — METFORMIN HCL ER 500 MG PO TB24: 1000.0000 mg | ORAL_TABLET | Freq: Every day | ORAL | Status: DC

## 2013-07-09 ENCOUNTER — Encounter: Payer: Self-pay | Admitting: Endocrinology

## 2013-07-09 ENCOUNTER — Ambulatory Visit (INDEPENDENT_AMBULATORY_CARE_PROVIDER_SITE_OTHER): Payer: BC Managed Care – PPO | Admitting: Endocrinology

## 2013-07-09 VITALS — BP 144/90 | HR 90 | Ht 65.0 in | Wt 189.0 lb

## 2013-07-09 DIAGNOSIS — E119 Type 2 diabetes mellitus without complications: Secondary | ICD-10-CM

## 2013-07-09 MED ORDER — GUANFACINE HCL 1 MG PO TABS: 1.0000 mg | ORAL_TABLET | Freq: Every day | ORAL | Status: DC

## 2013-07-09 MED ORDER — SIMVASTATIN 80 MG PO TABS: 80.0000 mg | ORAL_TABLET | Freq: Every day | ORAL | Status: DC

## 2013-07-09 MED ORDER — FLUOXETINE HCL 20 MG PO CAPS: 40.0000 mg | ORAL_CAPSULE | Freq: Every day | ORAL | Status: DC

## 2013-07-09 MED ORDER — GLUCOSE BLOOD VI STRP: 1.0000 | ORAL_STRIP | Freq: Every day | Status: DC

## 2013-07-09 MED ORDER — METFORMIN HCL ER 500 MG PO TB24: 1000.0000 mg | ORAL_TABLET | Freq: Every day | ORAL | Status: DC

## 2013-07-09 MED ORDER — HYDROCHLOROTHIAZIDE 12.5 MG PO CAPS: 12.5000 mg | ORAL_CAPSULE | Freq: Every day | ORAL | Status: DC

## 2013-07-09 NOTE — Patient Instructions (Addendum)
Please come back for a regular physical appointment in 1 month.  We'll recheck your blood pressure then.  Here is a new meter.  i have sent a prescription to your pharmacy, for strips.

## 2013-07-09 NOTE — Progress Notes (Signed)
Subjective:    Patient ID: Brittany Taylor, female    DOB: June 13, 1956, 57 y.o.   MRN: [id number]  HPI The state of at least three ongoing medical problems is addressed today, with interval history of each noted here: Pt returns for f/u of type 2 DM (dx'ed 2004; no known complications).  pt states she feels well in general.  no cbg record, but states cbg's are well-controlled. HTN: she takes meds as rx'ed.   Depression: prozac works well.   Past Medical History  Diagnosis Date  . HSV   . GOITER, MULTINODULAR   . DIABETES MELLITUS, TYPE II   . HYPERLIPIDEMIA   . LEUKOPENIA, CHRONIC   . DEPRESSION   . ALLERGIC RHINITIS   . TRIGGER FINGER, RIGHT THUMB   . ANGIOEDEMA   . ASYMPTOMATIC POSTMENOPAUSAL STATUS   . Psoriasis     Past Surgical History  Procedure Laterality Date  . Myonectomy  1995  . Leep    . Thumb surgery      History   Social History  . Marital Status: Single    Spouse Name: N/A    Number of Children: N/A  . Years of Education: N/A   Occupational History  . RN    Social History Main Topics  . Smoking status: Never Smoker   . Smokeless tobacco: Not on file  . Alcohol Use: No  . Drug Use: Not on file  . Sexual Activity: Not on file   Other Topics Concern  . Not on file   Social History Narrative  . No narrative on file    Current Outpatient Prescriptions on File Prior to Visit  Medication Sig Dispense Refill  . aspirin 81 MG tablet Take 81 mg by mouth daily.      Marland Kitchen azelastine (OPTIVAR) 0.05 % ophthalmic solution       . budesonide-formoterol (SYMBICORT) 160-4.5 MCG/ACT inhaler Inhale into the lungs. 1-2 puffs two times a day       . calcipotriene (DOVONOX) 0.005 % ointment       . Cetirizine HCl 10 MG CAPS Take 1 capsule by mouth daily.      . clobetasol cream (TEMOVATE) 0.05 % Apply topically 2 (two) times daily as needed.      . desonide (DESOWEN) 0.05 % cream Apply topically 2 (two) times daily. To face and ears      . diclofenac (VOLTAREN) 75  MG EC tablet Take 1 tablet (75 mg total) by mouth 2 (two) times daily.  30 tablet  0  . fluocinonide (LIDEX) 0.05 % cream Apply topically 3 (three) times daily as needed.        . metaxalone (SKELAXIN) 800 MG tablet Take 1 tablet (800 mg total) by mouth 3 (three) times daily. As muscle relaxer  21 tablet  0  . NON FORMULARY Allergy shots       . tacrolimus (PROTOPIC) 0.03 % ointment Apply topically 2 (two) times daily.      Marland Kitchen loratadine-pseudoephedrine (CLARITIN-D 12-HOUR) 5-120 MG per tablet Take 1 tablet by mouth as needed.       No current facility-administered medications on file prior to visit.    Allergies  Allergen Reactions  . Morphine Itching and Nausea And Vomiting  . Ramipril     REACTION: Angioedema   Family History  Problem Relation Age of Onset  . Cancer Neg Hx    BP 144/90  Pulse 90  Ht 5\' 5"  (1.651 m)  Wt 189 lb (85.73  kg)  BMI 31.45 kg/m2  SpO2 94%  Review of Systems Denies weight change and edema    Objective:   Physical Exam VITAL SIGNS:  See vs page GENERAL: no distress     Assessment & Plan:  DM: apparently well-controlled Depression: well-controlled HTN: she prob has situational component

## 2013-07-23 ENCOUNTER — Ambulatory Visit (INDEPENDENT_AMBULATORY_CARE_PROVIDER_SITE_OTHER): Payer: BC Managed Care – PPO | Admitting: Endocrinology

## 2013-07-23 VITALS — BP 118/70 | HR 78 | Ht 65.0 in | Wt 190.0 lb

## 2013-07-23 DIAGNOSIS — E785 Hyperlipidemia, unspecified: Secondary | ICD-10-CM

## 2013-07-23 DIAGNOSIS — Z0000 Encounter for general adult medical examination without abnormal findings: Secondary | ICD-10-CM

## 2013-07-23 DIAGNOSIS — E042 Nontoxic multinodular goiter: Secondary | ICD-10-CM

## 2013-07-23 DIAGNOSIS — F329 Major depressive disorder, single episode, unspecified: Secondary | ICD-10-CM

## 2013-07-23 DIAGNOSIS — E119 Type 2 diabetes mellitus without complications: Secondary | ICD-10-CM

## 2013-07-23 DIAGNOSIS — R9431 Abnormal electrocardiogram [ECG] [EKG]: Secondary | ICD-10-CM

## 2013-07-23 DIAGNOSIS — D72819 Decreased white blood cell count, unspecified: Secondary | ICD-10-CM

## 2013-07-23 DIAGNOSIS — D649 Anemia, unspecified: Secondary | ICD-10-CM

## 2013-07-23 DIAGNOSIS — Z79899 Other long term (current) drug therapy: Secondary | ICD-10-CM

## 2013-07-23 LAB — LIPID PANEL
Cholesterol: 146 mg/dL (ref 0–200)
HDL: 48.6 mg/dL (ref >39.00)
Triglycerides: 99 mg/dL (ref 0.0–149.0)

## 2013-07-23 LAB — URINALYSIS, ROUTINE W REFLEX MICROSCOPIC
Ketones, ur: NEGATIVE
Leukocytes, UA: NEGATIVE
Specific Gravity, Urine: 1.025 (ref 1.000–1.030)
pH: 6.5 (ref 5.0–8.0)

## 2013-07-23 LAB — HEPATIC FUNCTION PANEL
ALT: 15 U/L (ref 0–35)
AST: 19 U/L (ref 0–37)
Albumin: 3.9 g/dL (ref 3.5–5.2)
Alkaline Phosphatase: 74 U/L (ref 39–117)
Total Protein: 7.3 g/dL (ref 6.0–8.3)

## 2013-07-23 LAB — CBC WITH DIFFERENTIAL/PLATELET
Basophils Absolute: 0 10*3/uL (ref 0.0–0.1)
Basophils Relative: 0.8 % (ref 0.0–3.0)
Eosinophils Absolute: 0.1 10*3/uL (ref 0.0–0.7)
Lymphocytes Relative: 35.9 % (ref 12.0–46.0)
MCHC: 33.2 g/dL (ref 30.0–36.0)
Neutrophils Relative %: 47.5 % (ref 43.0–77.0)
Platelets: 298 10*3/uL (ref 150.0–400.0)
RBC: 3.99 Mil/uL (ref 3.87–5.11)
RDW: 14.9 % — ABNORMAL HIGH (ref 11.5–14.6)

## 2013-07-23 LAB — BASIC METABOLIC PANEL
BUN: 14 mg/dL (ref 6–23)
CO2: 31 mEq/L (ref 19–32)
Calcium: 9.4 mg/dL (ref 8.4–10.5)
Creatinine, Ser: 0.7 mg/dL (ref 0.4–1.2)
Glucose, Bld: 96 mg/dL (ref 70–99)

## 2013-07-23 NOTE — Progress Notes (Signed)
Subjective:    Patient ID: Brittany Taylor, female    DOB: Apr 09, 1956, 57 y.o.   MRN: [id number]  HPI Pt is here for regular wellness examination, and is feeling pretty well in general, and says chronic med probs are stable, except as noted below Past Medical History  Diagnosis Date  . HSV   . GOITER, MULTINODULAR   . DIABETES MELLITUS, TYPE II   . HYPERLIPIDEMIA   . LEUKOPENIA, CHRONIC   . DEPRESSION   . ALLERGIC RHINITIS   . TRIGGER FINGER, RIGHT THUMB   . ANGIOEDEMA   . ASYMPTOMATIC POSTMENOPAUSAL STATUS   . Psoriasis     Past Surgical History  Procedure Laterality Date  . Myonectomy  1995  . Leep    . Thumb surgery      History   Social History  . Marital Status: Single    Spouse Name: N/A    Number of Children: N/A  . Years of Education: N/A   Occupational History  . RN    Social History Main Topics  . Smoking status: Never Smoker   . Smokeless tobacco: Not on file  . Alcohol Use: No  . Drug Use: Not on file  . Sexual Activity: Not on file   Other Topics Concern  . Not on file   Social History Narrative  . No narrative on file    Current Outpatient Prescriptions on File Prior to Visit  Medication Sig Dispense Refill  . aspirin 81 MG tablet Take 81 mg by mouth daily.      Marland Kitchen azelastine (OPTIVAR) 0.05 % ophthalmic solution       . budesonide-formoterol (SYMBICORT) 160-4.5 MCG/ACT inhaler Inhale into the lungs. 1-2 puffs two times a day       . calcipotriene (DOVONOX) 0.005 % ointment       . Cetirizine HCl 10 MG CAPS Take 1 capsule by mouth daily.      . clobetasol cream (TEMOVATE) 0.05 % Apply topically 2 (two) times daily as needed.      . desonide (DESOWEN) 0.05 % cream Apply topically 2 (two) times daily. To face and ears      . diclofenac (VOLTAREN) 75 MG EC tablet Take 1 tablet (75 mg total) by mouth 2 (two) times daily.  30 tablet  0  . fluocinonide (LIDEX) 0.05 % cream Apply topically 3 (three) times daily as needed.        Marland Kitchen FLUoxetine  (PROZAC) 20 MG capsule Take 2 capsules (40 mg total) by mouth daily.  180 capsule  3  . glucose blood (ONETOUCH VERIO) test strip 1 each by Other route daily. And lancets 1/day 250.00  100 each  12  . guanFACINE (TENEX) 1 MG tablet Take 1 tablet (1 mg total) by mouth daily.  90 tablet  1  . hydrochlorothiazide (MICROZIDE) 12.5 MG capsule Take 1 capsule (12.5 mg total) by mouth daily.  90 capsule  3  . metaxalone (SKELAXIN) 800 MG tablet Take 1 tablet (800 mg total) by mouth 3 (three) times daily. As muscle relaxer  21 tablet  0  . metFORMIN (GLUCOPHAGE-XR) 500 MG 24 hr tablet Take 2 tablets (1,000 mg total) by mouth daily with breakfast.  60 tablet  4  . NON FORMULARY Allergy shots       . simvastatin (ZOCOR) 80 MG tablet Take 1 tablet (80 mg total) by mouth at bedtime.  90 tablet  1  . tacrolimus (PROTOPIC) 0.03 % ointment Apply topically 2 (two)  times daily.      Marland Kitchen loratadine-pseudoephedrine (CLARITIN-D 12-HOUR) 5-120 MG per tablet Take 1 tablet by mouth as needed.       No current facility-administered medications on file prior to visit.    Allergies  Allergen Reactions  . Morphine Itching and Nausea And Vomiting  . Ramipril     REACTION: Angioedema    Family History  Problem Relation Age of Onset  . Cancer Neg Hx     BP 118/70  Pulse 78  Ht 5\' 5"  (1.651 m)  Wt 190 lb (86.183 kg)  BMI 31.62 kg/m2  SpO2 97%  Review of Systems  Constitutional: Negative for fever.  HENT: Negative for hearing loss.   Eyes: Negative for visual disturbance.  Respiratory: Negative for shortness of breath.   Cardiovascular: Negative for chest pain.  Gastrointestinal: Negative for anal bleeding.  Endocrine: Negative for cold intolerance.  Genitourinary: Negative for hematuria.  Musculoskeletal: Negative for back pain.  Skin:       Psoriasis is improved  Allergic/Immunologic: Negative for environmental allergies.  Neurological: Negative for numbness.  Hematological: Does not bruise/bleed  easily.  Psychiatric/Behavioral:       Depression is well-controlled       Objective:   Physical Exam VS: see vs page GEN: no distress HEAD: head: no deformity eyes: no periorbital swelling, no proptosis external nose and ears are normal mouth: no lesion seen NECK: supple, thyroid is not enlarged CHEST WALL: no deformity LUNGS:  Clear to auscultation BREASTS: sees gyn CV: reg rate and rhythm, no murmur ABD: abdomen is soft, nontender.  no hepatosplenomegaly.  not distended.  no hernia.   GENITALIA/RECTAL: sees gyn.   MUSCULOSKELETAL: muscle bulk and strength are grossly normal.  no obvious joint swelling.  gait is normal and steady PULSES: no carotid bruit NEURO:  cn 2-12 grossly intact.   readily moves all 4's.   SKIN:  Normal texture and temperature.  No rash or suspicious lesion is visible.   NODES:  None palpable at the neck PSYCH: alert, oriented x3.  Does not appear anxious nor depressed.      Assessment & Plan:  Wellness visit today, with problems stable, except as noted. we discussed code status.  pt requests full code, but would not want to be started or maintained on artificial life-support measures if there was not a reasonable chance of recovery.       SEPARATE EVALUATION FOLLOWS--EACH PROBLEM HERE IS NEW, NOT RESPONDING TO TREATMENT, OR POSES SIGNIFICANT RISK TO THE PATIENT'S HEALTH: HISTORY OF THE PRESENT ILLNESS: Anemia is noted today.  She denies syncope PAST MEDICAL HISTORY reviewed and up to date today REVIEW OF SYSTEMS: Denies weight loss. PHYSICAL EXAMINATION: VITAL SIGNS:  See vs page GENERAL: no distress Skin: no ecchymoses LAB/XRAY RESULTS: Lab Results  Component Value Date   WBC 3.0* 07/23/2013   HGB 11.8* 07/23/2013   HCT 35.7* 07/23/2013   MCV 89.3 07/23/2013   PLT 298.0 07/23/2013  IMPRESSION: Anemia, new PLAN: See instruction page

## 2013-07-23 NOTE — Patient Instructions (Addendum)
Let's check an "echocardiogram."  you will receive a phone call, about a day and time for an appointment.   please consider these measures for your health:  minimize alcohol.  do not use tobacco products.  have a colonoscopy at least every 10 years from age 57.  Women should have an annual mammogram from age 36.  keep firearms safely stored.  always use seat belts.  have working smoke alarms in your home.  see an eye doctor and dentist regularly.  never drive under the influence of alcohol or drugs (including prescription drugs).   Let's check and ultrasound of the thyroid.  you will receive a phone call, about a day and time for an appointment

## 2013-07-24 DIAGNOSIS — D649 Anemia, unspecified: Secondary | ICD-10-CM | POA: Insufficient documentation

## 2013-07-28 ENCOUNTER — Ambulatory Visit
Admission: RE | Admit: 2013-07-28 | Discharge: 2013-07-28 | Disposition: A | Discharge status: Home / Self Care | Payer: BC Managed Care – PPO | Source: Ambulatory Visit | Attending: Endocrinology | Admitting: Endocrinology

## 2013-08-09 ENCOUNTER — Ambulatory Visit (HOSPITAL_COMMUNITY): Payer: BC Managed Care – PPO | Attending: Endocrinology | Admitting: Radiology

## 2013-08-09 ENCOUNTER — Other Ambulatory Visit (HOSPITAL_COMMUNITY): Payer: Self-pay | Admitting: Endocrinology

## 2013-08-09 DIAGNOSIS — R9431 Abnormal electrocardiogram [ECG] [EKG]: Secondary | ICD-10-CM | POA: Insufficient documentation

## 2013-08-09 DIAGNOSIS — E785 Hyperlipidemia, unspecified: Secondary | ICD-10-CM | POA: Insufficient documentation

## 2013-08-09 DIAGNOSIS — I059 Rheumatic mitral valve disease, unspecified: Secondary | ICD-10-CM | POA: Insufficient documentation

## 2013-08-09 DIAGNOSIS — I079 Rheumatic tricuspid valve disease, unspecified: Secondary | ICD-10-CM | POA: Insufficient documentation

## 2013-08-09 DIAGNOSIS — E119 Type 2 diabetes mellitus without complications: Secondary | ICD-10-CM | POA: Insufficient documentation

## 2013-08-09 NOTE — Progress Notes (Signed)
Echocardiogram performed.  

## 2013-09-10 ENCOUNTER — Encounter: Payer: Self-pay | Admitting: Cardiology

## 2013-12-16 ENCOUNTER — Other Ambulatory Visit: Payer: Self-pay | Admitting: Obstetrics and Gynecology

## 2013-12-16 DIAGNOSIS — Z1231 Encounter for screening mammogram for malignant neoplasm of breast: Secondary | ICD-10-CM

## 2013-12-21 ENCOUNTER — Ambulatory Visit (HOSPITAL_COMMUNITY)
Admission: RE | Admit: 2013-12-21 | Discharge: 2013-12-21 | Disposition: A | Discharge status: Home / Self Care | Payer: BC Managed Care – PPO | Source: Ambulatory Visit | Attending: Obstetrics and Gynecology | Admitting: Obstetrics and Gynecology

## 2013-12-21 ENCOUNTER — Other Ambulatory Visit: Payer: Self-pay | Admitting: Obstetrics and Gynecology

## 2013-12-21 DIAGNOSIS — Z1231 Encounter for screening mammogram for malignant neoplasm of breast: Secondary | ICD-10-CM

## 2014-01-28 ENCOUNTER — Other Ambulatory Visit: Payer: Self-pay

## 2014-01-28 MED ORDER — METFORMIN HCL ER 500 MG PO TB24: 1000.0000 mg | ORAL_TABLET | Freq: Every day | ORAL | Status: DC

## 2014-06-07 ENCOUNTER — Telehealth: Payer: Self-pay | Admitting: Endocrinology

## 2014-06-07 MED ORDER — METFORMIN HCL ER 500 MG PO TB24: 1000.0000 mg | ORAL_TABLET | Freq: Every day | ORAL | Status: DC

## 2014-06-07 MED ORDER — HYDROCHLOROTHIAZIDE 12.5 MG PO CAPS: 12.5000 mg | ORAL_CAPSULE | Freq: Every day | ORAL | Status: DC

## 2014-06-07 NOTE — Telephone Encounter (Signed)
Rx sent for a 30 day supply. Pt needs appointment for further refills.  

## 2014-06-07 NOTE — Telephone Encounter (Signed)
Patient would like to have her Rx  Metformin  Blood Pressure  Patient states that her metformin is running out and she is only  Taking one tab per day just so they do last  She said that all of her meds would need refills   [facility]  Thank you

## 2014-07-12 ENCOUNTER — Ambulatory Visit: Payer: BC Managed Care – PPO | Admitting: Endocrinology

## 2014-07-12 DIAGNOSIS — Z0289 Encounter for other administrative examinations: Secondary | ICD-10-CM

## 2014-08-09 ENCOUNTER — Ambulatory Visit (INDEPENDENT_AMBULATORY_CARE_PROVIDER_SITE_OTHER): Payer: BC Managed Care – PPO | Admitting: Endocrinology

## 2014-08-09 ENCOUNTER — Encounter: Payer: Self-pay | Admitting: Endocrinology

## 2014-08-09 VITALS — BP 132/90 | HR 80 | Temp 97.8°F | Ht 65.0 in | Wt 189.0 lb

## 2014-08-09 DIAGNOSIS — D649 Anemia, unspecified: Secondary | ICD-10-CM

## 2014-08-09 DIAGNOSIS — E785 Hyperlipidemia, unspecified: Secondary | ICD-10-CM

## 2014-08-09 DIAGNOSIS — E042 Nontoxic multinodular goiter: Secondary | ICD-10-CM

## 2014-08-09 DIAGNOSIS — D72819 Decreased white blood cell count, unspecified: Secondary | ICD-10-CM

## 2014-08-09 DIAGNOSIS — E119 Type 2 diabetes mellitus without complications: Secondary | ICD-10-CM

## 2014-08-09 DIAGNOSIS — Z23 Encounter for immunization: Secondary | ICD-10-CM

## 2014-08-09 DIAGNOSIS — Z0000 Encounter for general adult medical examination without abnormal findings: Secondary | ICD-10-CM

## 2014-08-09 DIAGNOSIS — N951 Menopausal and female climacteric states: Secondary | ICD-10-CM

## 2014-08-09 LAB — CBC WITH DIFFERENTIAL/PLATELET
BASOS PCT: 0.6 % (ref 0.0–3.0)
Basophils Absolute: 0 10*3/uL (ref 0.0–0.1)
EOS ABS: 0.1 10*3/uL (ref 0.0–0.7)
EOS PCT: 4.6 % (ref 0.0–5.0)
HCT: 36.7 % (ref 36.0–46.0)
HEMOGLOBIN: 12 g/dL (ref 12.0–15.0)
LYMPHS PCT: 32.5 % (ref 12.0–46.0)
Lymphs Abs: 0.9 10*3/uL (ref 0.7–4.0)
MCHC: 32.6 g/dL (ref 30.0–36.0)
MCV: 87.3 fl (ref 78.0–100.0)
MONOS PCT: 11.6 % (ref 3.0–12.0)
Monocytes Absolute: 0.3 10*3/uL (ref 0.1–1.0)
NEUTROS ABS: 1.4 10*3/uL (ref 1.4–7.7)
NEUTROS PCT: 50.7 % (ref 43.0–77.0)
Platelets: 316 10*3/uL (ref 150.0–400.0)
RBC: 4.2 Mil/uL (ref 3.87–5.11)
RDW: 15.6 % — ABNORMAL HIGH (ref 11.5–15.5)
WBC: 2.8 10*3/uL — AB (ref 4.0–10.5)

## 2014-08-09 LAB — BASIC METABOLIC PANEL
BUN: 16 mg/dL (ref 6–23)
CALCIUM: 9.1 mg/dL (ref 8.4–10.5)
CO2: 30 meq/L (ref 19–32)
CREATININE: 0.7 mg/dL (ref 0.4–1.2)
Chloride: 101 mEq/L (ref 96–112)
GFR: 108.74 mL/min (ref >60.00)
GLUCOSE: 82 mg/dL (ref 70–99)
Potassium: 4 mEq/L (ref 3.5–5.1)
Sodium: 135 mEq/L (ref 135–145)

## 2014-08-09 LAB — URINALYSIS, ROUTINE W REFLEX MICROSCOPIC
Bilirubin Urine: NEGATIVE
Hgb urine dipstick: NEGATIVE
KETONES UR: NEGATIVE
Leukocytes, UA: NEGATIVE
Nitrite: NEGATIVE
SPECIFIC GRAVITY, URINE: 1.02 (ref 1.000–1.030)
TOTAL PROTEIN, URINE-UPE24: NEGATIVE
UROBILINOGEN UA: 0.2 (ref 0.0–1.0)
Urine Glucose: NEGATIVE
pH: 6 (ref 5.0–8.0)

## 2014-08-09 LAB — LIPID PANEL
CHOL/HDL RATIO: 3
Cholesterol: 156 mg/dL (ref 0–200)
HDL: 54 mg/dL (ref >39.00)
LDL Cholesterol: 90 mg/dL (ref 0–99)
NonHDL: 102
Triglycerides: 60 mg/dL (ref 0.0–149.0)
VLDL: 12 mg/dL (ref 0.0–40.0)

## 2014-08-09 LAB — IBC PANEL
Iron: 55 ug/dL (ref 42–145)
Saturation Ratios: 12 % — ABNORMAL LOW (ref 20.0–50.0)
TRANSFERRIN: 328.6 mg/dL (ref 212.0–360.0)

## 2014-08-09 LAB — HEPATIC FUNCTION PANEL
ALBUMIN: 4.1 g/dL (ref 3.5–5.2)
ALT: 22 U/L (ref 0–35)
AST: 21 U/L (ref 0–37)
Alkaline Phosphatase: 94 U/L (ref 39–117)
Bilirubin, Direct: 0 mg/dL (ref 0.0–0.3)
TOTAL PROTEIN: 8 g/dL (ref 6.0–8.3)
Total Bilirubin: 0.3 mg/dL (ref 0.2–1.2)

## 2014-08-09 LAB — HEMOGLOBIN A1C: Hgb A1c MFr Bld: 6.8 % — ABNORMAL HIGH (ref 4.6–6.5)

## 2014-08-09 LAB — TSH: TSH: 1.11 u[IU]/mL (ref 0.35–4.50)

## 2014-08-09 LAB — MICROALBUMIN / CREATININE URINE RATIO
Creatinine,U: 98.2 mg/dL
MICROALB UR: 0.7 mg/dL (ref 0.0–1.9)
Microalb Creat Ratio: 0.7 mg/g (ref 0.0–30.0)

## 2014-08-09 MED ORDER — GUANFACINE HCL 1 MG PO TABS: 1.0000 mg | ORAL_TABLET | Freq: Every day | ORAL | Status: DC

## 2014-08-09 NOTE — Patient Instructions (Addendum)
please consider these measures for your health:  minimize alcohol.  do not use tobacco products.  have a colonoscopy at least every 10 years from age 58.  Women should have an annual mammogram from age 102.  keep firearms safely stored.  always use seat belts.  have working smoke alarms in your home.  see an eye doctor and dentist regularly.  never drive under the influence of alcohol or drugs (including prescription drugs).   blood tests are being requested for you today.  We'll contact you with results.   Please come back for a follow-up appointment in 6 months.

## 2014-08-09 NOTE — Progress Notes (Signed)
Subjective:    Patient ID: Brittany Taylor, female    DOB: [birth year], 58 y.o.   MRN: [id number]  HPI Pt is here for regular wellness examination, and is feeling pretty well in general, and says chronic med probs are stable, except as noted below Past Medical History  Diagnosis Date  . HSV   . GOITER, MULTINODULAR   . DIABETES MELLITUS, TYPE II   . HYPERLIPIDEMIA   . LEUKOPENIA, CHRONIC   . DEPRESSION   . ALLERGIC RHINITIS   . TRIGGER FINGER, RIGHT THUMB   . ANGIOEDEMA   . ASYMPTOMATIC POSTMENOPAUSAL STATUS   . Psoriasis     Past Surgical History  Procedure Laterality Date  . Myonectomy  1995  . Leep    . Thumb surgery      History   Social History  . Marital Status: Single    Spouse Name: N/A    Number of Children: N/A  . Years of Education: N/A   Occupational History  . RN    Social History Main Topics  . Smoking status: Never Smoker   . Smokeless tobacco: Not on file  . Alcohol Use: No  . Drug Use: Not on file  . Sexual Activity: Not on file   Other Topics Concern  . Not on file   Social History Narrative  . No narrative on file    Current Outpatient Prescriptions on File Prior to Visit  Medication Sig Dispense Refill  . aspirin 81 MG tablet Take 81 mg by mouth daily.      Marland Kitchen azelastine (OPTIVAR) 0.05 % ophthalmic solution       . budesonide-formoterol (SYMBICORT) 160-4.5 MCG/ACT inhaler Inhale into the lungs. 1-2 puffs two times a day       . calcipotriene (DOVONOX) 0.005 % ointment       . Cetirizine HCl 10 MG CAPS Take 1 capsule by mouth daily.      . clobetasol cream (TEMOVATE) 0.05 % Apply topically 2 (two) times daily as needed.      . desonide (DESOWEN) 0.05 % cream Apply topically 2 (two) times daily. To face and ears      . diclofenac (VOLTAREN) 75 MG EC tablet Take 1 tablet (75 mg total) by mouth 2 (two) times daily.  30 tablet  0  . fluocinonide (LIDEX) 0.05 % cream Apply topically 3 (three) times daily as needed.        Marland Kitchen FLUoxetine  (PROZAC) 20 MG capsule Take 2 capsules (40 mg total) by mouth daily.  180 capsule  3  . glucose blood (ONETOUCH VERIO) test strip 1 each by Other route daily. And lancets 1/day 250.00  100 each  12  . hydrochlorothiazide (MICROZIDE) 12.5 MG capsule Take 1 capsule (12.5 mg total) by mouth daily.  30 capsule  0  . metaxalone (SKELAXIN) 800 MG tablet Take 1 tablet (800 mg total) by mouth 3 (three) times daily. As muscle relaxer  21 tablet  0  . NON FORMULARY Allergy shots       . simvastatin (ZOCOR) 80 MG tablet Take 1 tablet (80 mg total) by mouth at bedtime.  90 tablet  1  . tacrolimus (PROTOPIC) 0.03 % ointment Apply topically 2 (two) times daily.      Marland Kitchen loratadine-pseudoephedrine (CLARITIN-D 12-HOUR) 5-120 MG per tablet Take 1 tablet by mouth as needed.       No current facility-administered medications on file prior to visit.    Allergies  Allergen Reactions  .  Morphine Itching and Nausea And Vomiting  . Ramipril     REACTION: Angioedema    Family History  Problem Relation Age of Onset  . Cancer Neg Hx     BP 132/90  Pulse 80  Temp(Src) 97.8 F (36.6 C) (Oral)  Ht 5\' 5"  (1.651 m)  Wt 189 lb (85.73 kg)  BMI 31.45 kg/m2  SpO2 93%   Review of Systems  Constitutional: Negative for fever.  HENT: Negative for hearing loss.   Eyes: Negative for visual disturbance.  Respiratory: Negative for shortness of breath.   Cardiovascular: Negative for chest pain.  Gastrointestinal: Negative for anal bleeding.  Endocrine: Negative for cold intolerance.  Genitourinary: Negative for hematuria.  Musculoskeletal: Negative for back pain.  Skin: Negative for wound.  Allergic/Immunologic: Positive for environmental allergies.  Neurological: Negative for syncope and numbness.  Hematological: Does not bruise/bleed easily.  Psychiatric/Behavioral: Negative for dysphoric mood.       Objective:   Physical Exam VS: see vs page GEN: no distress HEAD: head: no deformity eyes: no periorbital  swelling, no proptosis external nose and ears are normal mouth: no lesion seen NECK: supple, thyroid is not enlarged CHEST WALL: no deformity LUNGS:  Clear to auscultation BREASTS: sees gyn CV: reg rate and rhythm, no murmur. ABD: abdomen is soft, nontender.  no hepatosplenomegaly.  not distended.  no hernia GENITALIA/RECTAL: sees gyn MUSCULOSKELETAL: muscle bulk and strength are grossly normal.  no obvious joint swelling.  gait is normal and steady EXTEMITIES: no deformity.  no ulcer on the feet.  feet are of normal color and temp.  no edema.  PULSES: dorsalis pedis intact bilat.  no carotid bruit NEURO:  cn 2-12 grossly intact.   readily moves all 4's.  sensation is intact to touch on the feet SKIN:  Normal texture and temperature.  No rash or suspicious lesion is visible.  Psoriatic plaques and hyperpigmentation. NODES:  None palpable at the neck. PSYCH: alert, well-oriented.  Does not appear anxious nor depressed.        Assessment & Plan:  Wellness visit today, with problems stable, except as noted. we discussed code status.  pt requests full code, but would not want to be started or maintained on artificial life-support measures if there was not a reasonable chance of recovery.   Patient is advised the following: Patient Instructions  please consider these measures for your health:  minimize alcohol.  do not use tobacco products.  have a colonoscopy at least every 10 years from age 51.  Women should have an annual mammogram from age 65.  keep firearms safely stored.  always use seat belts.  have working smoke alarms in your home.  see an eye doctor and dentist regularly.  never drive under the influence of alcohol or drugs (including prescription drugs).   blood tests are being requested for you today.  We'll contact you with results.   Please come back for a follow-up appointment in 6 months.      SEPARATE EVALUATION FOLLOWS--EACH PROBLEM HERE IS NEW, NOT RESPONDING TO  TREATMENT, OR POSES SIGNIFICANT RISK TO THE PATIENT'S HEALTH: HISTORY OF THE PRESENT ILLNESS:  Pt returns for f/u of diabetes mellitus:  DM type: 2 Dx'ed: 9798 Complications: none Therapy: metformin GDM: never DKA: never Severe hypoglycemia: never Pancreatitis: never  Interval history: pt states she feels well in general. She recently ran out of tenex   [place] reviewed and up to date today REVIEW OF SYSTEMS: Denies weight change PHYSICAL  EXAMINATION: VITAL SIGNS:  See vs page GENERAL: no distress EXTEMITIES: no deformity.  no ulcer on the feet.  feet are of normal color and temp.  no edema.  PULSES: dorsalis pedis intact bilat.  no carotid bruit NEURO:  cn 2-12 grossly intact.   readily moves all 4's.  sensation is intact to touch on the feet SKIN:  Normal texture and temperature.  No rash or suspicious lesion is visible. LAB/XRAY RESULTS: Lab Results  Component Value Date   HGBA1C 6.8* 08/09/2014  IMPRESSION: DM: mild exacerbation HTN: therapy limited by noncompliance.  i'll do the best i can. PLAN: double metformin.  Resume tenex

## 2014-08-10 MED ORDER — METFORMIN HCL ER 500 MG PO TB24: ORAL_TABLET | ORAL | Status: DC

## 2014-08-15 ENCOUNTER — Ambulatory Visit (INDEPENDENT_AMBULATORY_CARE_PROVIDER_SITE_OTHER)
Admission: RE | Admit: 2014-08-15 | Discharge: 2014-08-15 | Disposition: A | Discharge status: Home / Self Care | Payer: BC Managed Care – PPO | Source: Ambulatory Visit | Attending: Endocrinology | Admitting: Endocrinology

## 2014-08-15 DIAGNOSIS — N951 Menopausal and female climacteric states: Secondary | ICD-10-CM

## 2014-08-17 ENCOUNTER — Telehealth: Payer: Self-pay | Admitting: Endocrinology

## 2014-08-17 NOTE — Telephone Encounter (Signed)
Patient is returing your call for lab results. And would like to get a zoster vac shot

## 2014-08-17 NOTE — Telephone Encounter (Signed)
Pt advised of lab results. Pt to contact insurance company to see about coverage for Shingles vaccine. PT will call back if she would like to set appointment up to have vaccine.

## 2014-08-27 ENCOUNTER — Other Ambulatory Visit: Payer: Self-pay | Admitting: Endocrinology

## 2014-08-31 LAB — HM DIABETES EYE EXAM

## 2014-09-01 ENCOUNTER — Encounter: Payer: Self-pay | Admitting: Endocrinology

## 2014-09-12 ENCOUNTER — Other Ambulatory Visit: Payer: Self-pay | Admitting: Obstetrics and Gynecology

## 2014-09-24 ENCOUNTER — Other Ambulatory Visit: Payer: Self-pay | Admitting: Endocrinology

## 2014-09-29 ENCOUNTER — Telehealth: Payer: Self-pay

## 2014-09-29 MED ORDER — FLUOXETINE HCL 20 MG PO CAPS: 40.0000 mg | ORAL_CAPSULE | Freq: Every day | ORAL | Status: DC

## 2014-09-29 NOTE — Telephone Encounter (Signed)
Received a refill requested for Prozac 20mg . Please advise if ok to refill medication. Thanks!

## 2014-09-29 NOTE — Telephone Encounter (Signed)
rx sent to pharmacy

## 2014-09-29 NOTE — Telephone Encounter (Signed)
Ok, please refill prn 

## 2014-10-29 ENCOUNTER — Other Ambulatory Visit: Payer: Self-pay | Admitting: Endocrinology

## 2014-11-05 ENCOUNTER — Other Ambulatory Visit: Payer: Self-pay | Admitting: Endocrinology

## 2015-01-05 ENCOUNTER — Other Ambulatory Visit: Payer: Self-pay | Admitting: Endocrinology

## 2015-01-17 HISTORY — PX: POLYPECTOMY: SHX149

## 2015-01-31 ENCOUNTER — Other Ambulatory Visit (HOSPITAL_COMMUNITY): Payer: Self-pay | Admitting: Obstetrics and Gynecology

## 2015-01-31 DIAGNOSIS — Z1231 Encounter for screening mammogram for malignant neoplasm of breast: Secondary | ICD-10-CM

## 2015-02-01 ENCOUNTER — Ambulatory Visit (HOSPITAL_COMMUNITY)
Admission: RE | Admit: 2015-02-01 | Discharge: 2015-02-01 | Disposition: A | Discharge status: Home / Self Care | Payer: BLUE CROSS/BLUE SHIELD | Source: Ambulatory Visit | Attending: Obstetrics and Gynecology | Admitting: Obstetrics and Gynecology

## 2015-02-01 DIAGNOSIS — Z1231 Encounter for screening mammogram for malignant neoplasm of breast: Secondary | ICD-10-CM | POA: Diagnosis not present

## 2015-02-03 ENCOUNTER — Other Ambulatory Visit: Payer: Self-pay | Admitting: Obstetrics and Gynecology

## 2015-02-07 ENCOUNTER — Encounter: Payer: Self-pay | Admitting: Endocrinology

## 2015-02-07 ENCOUNTER — Ambulatory Visit (INDEPENDENT_AMBULATORY_CARE_PROVIDER_SITE_OTHER): Payer: BLUE CROSS/BLUE SHIELD | Admitting: Endocrinology

## 2015-02-07 VITALS — BP 140/82 | HR 96 | Temp 98.2°F | Ht 65.0 in | Wt 192.0 lb

## 2015-02-07 DIAGNOSIS — E119 Type 2 diabetes mellitus without complications: Secondary | ICD-10-CM | POA: Diagnosis not present

## 2015-02-07 DIAGNOSIS — M6530 Trigger finger, unspecified finger: Secondary | ICD-10-CM

## 2015-02-07 LAB — HEMOGLOBIN A1C: Hgb A1c MFr Bld: 6.5 % (ref 4.6–6.5)

## 2015-02-07 MED ORDER — TRIAMTERENE-HCTZ 37.5-25 MG PO TABS: 1.0000 | ORAL_TABLET | Freq: Every day | ORAL | Status: DC

## 2015-02-07 MED ORDER — ATORVASTATIN CALCIUM 40 MG PO TABS: 40.0000 mg | ORAL_TABLET | Freq: Every day | ORAL | Status: DC

## 2015-02-07 NOTE — Progress Notes (Signed)
Subjective:    Patient ID: Brittany Taylor, female    DOB: 1956/10/02, 59 y.o.   MRN: [id number]  HPI Pt returns for f/u of diabetes mellitus: DM type: 2 Dx'ed: 4403 Complications: none Therapy: metformin GDM: never DKA: never Severe hypoglycemia: never Pancreatitis: never Other: she has never been on insulin.  Interval history: pt states she feels well in general.  Pt says cbg's are well-controlled.   She says her right ring finger is getting "hung up." Past Medical History  Diagnosis Date  . HSV   . GOITER, MULTINODULAR   . DIABETES MELLITUS, TYPE II   . HYPERLIPIDEMIA   . LEUKOPENIA, CHRONIC   . DEPRESSION   . ALLERGIC RHINITIS   . TRIGGER FINGER, RIGHT THUMB   . ANGIOEDEMA   . ASYMPTOMATIC POSTMENOPAUSAL STATUS   . Psoriasis     Past Surgical History  Procedure Laterality Date  . Myonectomy  1995  . Leep    . Thumb surgery      History   Social History  . Marital Status: Single    Spouse Name: N/A  . Number of Children: N/A  . Years of Education: N/A   Occupational History  . RN    Social History Main Topics  . Smoking status: Never Smoker   . Smokeless tobacco: Not on file  . Alcohol Use: No  . Drug Use: Not on file  . Sexual Activity: Not on file   Other Topics Concern  . Not on file   Social History Narrative    Current Outpatient Prescriptions on File Prior to Visit  Medication Sig Dispense Refill  . aspirin 81 MG tablet Take 81 mg by mouth daily.    Marland Kitchen azelastine (OPTIVAR) 0.05 % ophthalmic solution     . budesonide-formoterol (SYMBICORT) 160-4.5 MCG/ACT inhaler Inhale into the lungs. 1-2 puffs two times a day     . calcipotriene (DOVONOX) 0.005 % ointment     . Cetirizine HCl 10 MG CAPS Take 1 capsule by mouth daily.    . clobetasol cream (TEMOVATE) 0.05 % Apply topically 2 (two) times daily as needed.    . desonide (DESOWEN) 0.05 % cream Apply topically 2 (two) times daily. To face and ears    . fluocinonide (LIDEX) 0.05 % cream  Apply topically 3 (three) times daily as needed.      Marland Kitchen FLUoxetine (PROZAC) 20 MG capsule Take 2 capsules (40 mg total) by mouth daily. 180 capsule 3  . glucose blood (ONETOUCH VERIO) test strip 1 each by Other route daily. And lancets 1/day 250.00 100 each 12  . guanFACINE (TENEX) 1 MG tablet Take 1 tablet (1 mg total) by mouth daily. 90 tablet 3  . metFORMIN (GLUCOPHAGE-XR) 500 MG 24 hr tablet 4 tabs daily 120 tablet 11  . NON FORMULARY Allergy shots     . tacrolimus (PROTOPIC) 0.03 % ointment Apply topically 2 (two) times daily.     No current facility-administered medications on file prior to visit.    Allergies  Allergen Reactions  . Morphine Itching and Nausea And Vomiting  . Ramipril     REACTION: Angioedema    Family History  Problem Relation Age of Onset  . Cancer Neg Hx     BP 140/82 mmHg  Pulse 96  Temp(Src) 98.2 F (36.8 C) (Oral)  Ht 5\' 5"  (1.651 m)  Wt 192 lb (87.091 kg)  BMI 31.95 kg/m2  SpO2 94%     Review of Systems Denies drowsiness  or dizziness.    Objective:   Physical Exam VITAL SIGNS:  See vs page GENERAL: no distress Pulses: dorsalis pedis intact bilat.   MSK: no deformity of the feet CV: trace bilat leg edema Skin:  no ulcer on the feet.  normal color and temp on the feet, but there is severe psoriasis of the legs. Neuro: sensation is intact to touch on the feet.    Lab Results  Component Value Date   HGBA1C 6.5 02/07/2015       Assessment & Plan:  DM: well-controlled HTN: mild exacerbation Trigger finger, new  Patient is advised the following: Patient Instructions  blood tests are being requested for you today.  We'll let you know about the results. Please change the HCTZ to "triamterene/HCTZ."  i have sent a prescription to your pharmacy Please come back for a regular physical appointment in 6 months (must be after 08/10/15).  i have also sent a prescription to your pharmacy, for generic lipitor.  Please see Dr Fredna Dow.  you  will receive a phone call, about a day and time for an appointment  addendum: Please continue the same metformin.

## 2015-02-07 NOTE — Patient Instructions (Addendum)
blood tests are being requested for you today.  We'll let you know about the results. Please change the HCTZ to "triamterene/HCTZ."  i have sent a prescription to your pharmacy Please come back for a regular physical appointment in 6 months (must be after 08/10/15).  i have also sent a prescription to your pharmacy, for generic lipitor.  Please see Dr Fredna Dow.  you will receive a phone call, about a day and time for an appointment

## 2015-03-13 ENCOUNTER — Other Ambulatory Visit: Payer: Self-pay | Admitting: Orthopedic Surgery

## 2015-03-19 DIAGNOSIS — M65841 Other synovitis and tenosynovitis, right hand: Secondary | ICD-10-CM

## 2015-03-19 HISTORY — DX: Other synovitis and tenosynovitis, right hand: M65.841

## 2015-04-03 ENCOUNTER — Telehealth: Payer: Self-pay | Admitting: Endocrinology

## 2015-04-03 NOTE — Telephone Encounter (Signed)
Please call with lab results 

## 2015-04-03 NOTE — Telephone Encounter (Signed)
Patient last A1C from 3/22 was 6.5. Patient voiced understanding.

## 2015-04-10 ENCOUNTER — Encounter (HOSPITAL_BASED_OUTPATIENT_CLINIC_OR_DEPARTMENT_OTHER): Payer: Self-pay | Admitting: *Deleted

## 2015-04-10 NOTE — Pre-Procedure Instructions (Signed)
To come for BMET 

## 2015-04-11 ENCOUNTER — Encounter (HOSPITAL_BASED_OUTPATIENT_CLINIC_OR_DEPARTMENT_OTHER)
Admission: RE | Admit: 2015-04-11 | Discharge: 2015-04-11 | Disposition: A | Discharge status: Home / Self Care | Payer: BLUE CROSS/BLUE SHIELD | Source: Ambulatory Visit | Attending: Orthopedic Surgery | Admitting: Orthopedic Surgery

## 2015-04-11 DIAGNOSIS — E119 Type 2 diabetes mellitus without complications: Secondary | ICD-10-CM | POA: Diagnosis not present

## 2015-04-11 DIAGNOSIS — Z791 Long term (current) use of non-steroidal anti-inflammatories (NSAID): Secondary | ICD-10-CM | POA: Diagnosis not present

## 2015-04-11 DIAGNOSIS — M65341 Trigger finger, right ring finger: Secondary | ICD-10-CM | POA: Diagnosis not present

## 2015-04-11 DIAGNOSIS — I10 Essential (primary) hypertension: Secondary | ICD-10-CM | POA: Diagnosis not present

## 2015-04-11 DIAGNOSIS — Z7951 Long term (current) use of inhaled steroids: Secondary | ICD-10-CM | POA: Diagnosis not present

## 2015-04-11 DIAGNOSIS — F329 Major depressive disorder, single episode, unspecified: Secondary | ICD-10-CM | POA: Diagnosis not present

## 2015-04-11 DIAGNOSIS — E780 Pure hypercholesterolemia: Secondary | ICD-10-CM | POA: Diagnosis not present

## 2015-04-11 DIAGNOSIS — J45909 Unspecified asthma, uncomplicated: Secondary | ICD-10-CM | POA: Diagnosis not present

## 2015-04-11 DIAGNOSIS — Z7982 Long term (current) use of aspirin: Secondary | ICD-10-CM | POA: Diagnosis not present

## 2015-04-11 DIAGNOSIS — Z79899 Other long term (current) drug therapy: Secondary | ICD-10-CM | POA: Diagnosis not present

## 2015-04-11 LAB — BASIC METABOLIC PANEL
Anion gap: 9 (ref 5–15)
BUN: 17 mg/dL (ref 6–20)
CHLORIDE: 101 mmol/L (ref 101–111)
CO2: 29 mmol/L (ref 22–32)
CREATININE: 0.65 mg/dL (ref 0.44–1.00)
Calcium: 9.7 mg/dL (ref 8.9–10.3)
GFR calc non Af Amer: >60 mL/min (ref >60)
GLUCOSE: 81 mg/dL (ref 65–99)
Potassium: 4 mmol/L (ref 3.5–5.1)
Sodium: 139 mmol/L (ref 135–145)

## 2015-04-13 ENCOUNTER — Ambulatory Visit (HOSPITAL_BASED_OUTPATIENT_CLINIC_OR_DEPARTMENT_OTHER): Payer: BLUE CROSS/BLUE SHIELD | Admitting: Anesthesiology

## 2015-04-13 ENCOUNTER — Encounter (HOSPITAL_BASED_OUTPATIENT_CLINIC_OR_DEPARTMENT_OTHER): Payer: Self-pay | Admitting: *Deleted

## 2015-04-13 ENCOUNTER — Encounter (HOSPITAL_BASED_OUTPATIENT_CLINIC_OR_DEPARTMENT_OTHER)
Admission: RE | Disposition: A | Discharge status: Home / Self Care | Payer: Self-pay | Source: Ambulatory Visit | Attending: Orthopedic Surgery

## 2015-04-13 ENCOUNTER — Ambulatory Visit (HOSPITAL_BASED_OUTPATIENT_CLINIC_OR_DEPARTMENT_OTHER)
Admission: RE | Admit: 2015-04-13 | Discharge: 2015-04-13 | Disposition: A | Discharge status: Home / Self Care | Payer: BLUE CROSS/BLUE SHIELD | Source: Ambulatory Visit | Attending: Orthopedic Surgery | Admitting: Orthopedic Surgery

## 2015-04-13 DIAGNOSIS — M65341 Trigger finger, right ring finger: Secondary | ICD-10-CM | POA: Diagnosis not present

## 2015-04-13 DIAGNOSIS — I10 Essential (primary) hypertension: Secondary | ICD-10-CM | POA: Insufficient documentation

## 2015-04-13 DIAGNOSIS — J45909 Unspecified asthma, uncomplicated: Secondary | ICD-10-CM | POA: Insufficient documentation

## 2015-04-13 DIAGNOSIS — Z79899 Other long term (current) drug therapy: Secondary | ICD-10-CM | POA: Insufficient documentation

## 2015-04-13 DIAGNOSIS — Z7951 Long term (current) use of inhaled steroids: Secondary | ICD-10-CM | POA: Insufficient documentation

## 2015-04-13 DIAGNOSIS — Z7982 Long term (current) use of aspirin: Secondary | ICD-10-CM | POA: Insufficient documentation

## 2015-04-13 DIAGNOSIS — E119 Type 2 diabetes mellitus without complications: Secondary | ICD-10-CM | POA: Insufficient documentation

## 2015-04-13 DIAGNOSIS — E780 Pure hypercholesterolemia: Secondary | ICD-10-CM | POA: Insufficient documentation

## 2015-04-13 DIAGNOSIS — F329 Major depressive disorder, single episode, unspecified: Secondary | ICD-10-CM | POA: Insufficient documentation

## 2015-04-13 DIAGNOSIS — Z791 Long term (current) use of non-steroidal anti-inflammatories (NSAID): Secondary | ICD-10-CM | POA: Insufficient documentation

## 2015-04-13 HISTORY — DX: Pure hypercholesterolemia, unspecified: E78.00

## 2015-04-13 HISTORY — DX: Depression, unspecified: F32.A

## 2015-04-13 HISTORY — DX: Essential (primary) hypertension: I10

## 2015-04-13 HISTORY — DX: Dental restoration status: Z98.811

## 2015-04-13 HISTORY — DX: Unspecified asthma, uncomplicated: J45.909

## 2015-04-13 HISTORY — DX: Other synovitis and tenosynovitis, right hand: M65.841

## 2015-04-13 HISTORY — PX: TRIGGER FINGER RELEASE: SHX641

## 2015-04-13 HISTORY — DX: Major depressive disorder, single episode, unspecified: F32.9

## 2015-04-13 HISTORY — DX: Type 2 diabetes mellitus without complications: E11.9

## 2015-04-13 LAB — GLUCOSE, CAPILLARY
GLUCOSE-CAPILLARY: 98 mg/dL (ref 65–99)
Glucose-Capillary: 112 mg/dL — ABNORMAL HIGH (ref 65–99)

## 2015-04-13 LAB — POCT HEMOGLOBIN-HEMACUE: Hemoglobin: 12.4 g/dL (ref 12.0–15.0)

## 2015-04-13 SURGERY — RELEASE, A1 PULLEY, FOR TRIGGER FINGER
Anesthesia: Monitor Anesthesia Care | Site: Hand | Laterality: Right

## 2015-04-13 MED ORDER — HYDROCODONE-ACETAMINOPHEN 5-325 MG PO TABS: 1.0000 | ORAL_TABLET | Freq: Four times a day (QID) | ORAL | Status: DC | PRN

## 2015-04-13 MED ORDER — FENTANYL CITRATE (PF) 100 MCG/2ML IJ SOLN
INTRAMUSCULAR | Status: DC | PRN
  Administered 2015-04-13: 50 ug via INTRAVENOUS

## 2015-04-13 MED ORDER — BUPIVACAINE HCL (PF) 0.25 % IJ SOLN
INTRAMUSCULAR | Status: AC
  Filled 2015-04-13: qty 30

## 2015-04-13 MED ORDER — CHLORHEXIDINE GLUCONATE 4 % EX LIQD: 60.0000 mL | Freq: Once | CUTANEOUS | Status: DC

## 2015-04-13 MED ORDER — MIDAZOLAM HCL 5 MG/5ML IJ SOLN
INTRAMUSCULAR | Status: DC | PRN
  Administered 2015-04-13: 1 mg via INTRAVENOUS

## 2015-04-13 MED ORDER — LACTATED RINGERS IV SOLN
INTRAVENOUS | Status: DC
  Administered 2015-04-13: 08:00:00 via INTRAVENOUS

## 2015-04-13 MED ORDER — CEFAZOLIN SODIUM-DEXTROSE 2-3 GM-% IV SOLR
INTRAVENOUS | Status: AC
  Filled 2015-04-13: qty 50

## 2015-04-13 MED ORDER — PROPOFOL INFUSION 10 MG/ML OPTIME
INTRAVENOUS | Status: DC | PRN
  Administered 2015-04-13: 75 ug/kg/min via INTRAVENOUS

## 2015-04-13 MED ORDER — SCOPOLAMINE 1 MG/3DAYS TD PT72
MEDICATED_PATCH | TRANSDERMAL | Status: AC
  Filled 2015-04-13: qty 1

## 2015-04-13 MED ORDER — MIDAZOLAM HCL 2 MG/2ML IJ SOLN
INTRAMUSCULAR | Status: AC
  Filled 2015-04-13: qty 2

## 2015-04-13 MED ORDER — CEFAZOLIN SODIUM-DEXTROSE 2-3 GM-% IV SOLR
2.0000 g | INTRAVENOUS | Status: AC
  Administered 2015-04-13: 2 g via INTRAVENOUS

## 2015-04-13 MED ORDER — FENTANYL CITRATE (PF) 100 MCG/2ML IJ SOLN
INTRAMUSCULAR | Status: AC
  Filled 2015-04-13: qty 4

## 2015-04-13 MED ORDER — ONDANSETRON HCL 4 MG/2ML IJ SOLN
INTRAMUSCULAR | Status: DC | PRN
  Administered 2015-04-13: 4 mg via INTRAVENOUS

## 2015-04-13 MED ORDER — BUPIVACAINE HCL (PF) 0.25 % IJ SOLN
INTRAMUSCULAR | Status: DC | PRN
  Administered 2015-04-13: 6 mL

## 2015-04-13 MED ORDER — CEFAZOLIN SODIUM-DEXTROSE 2-3 GM-% IV SOLR: 2.0000 g | INTRAVENOUS | Status: DC

## 2015-04-13 SURGICAL SUPPLY — 35 items
BANDAGE COBAN STERILE 2 (GAUZE/BANDAGES/DRESSINGS) ×2 IMPLANT
BLADE SURG 15 STRL LF DISP TIS (BLADE) ×1 IMPLANT
BLADE SURG 15 STRL SS (BLADE) ×2
BNDG CMPR 9X4 STRL LF SNTH (GAUZE/BANDAGES/DRESSINGS)
BNDG ESMARK 4X9 LF (GAUZE/BANDAGES/DRESSINGS) IMPLANT
CHLORAPREP W/TINT 26ML (MISCELLANEOUS) ×2 IMPLANT
CORDS BIPOLAR (ELECTRODE) IMPLANT
COVER BACK TABLE 60X90IN (DRAPES) ×2 IMPLANT
COVER MAYO STAND STRL (DRAPES) ×2 IMPLANT
CUFF TOURNIQUET SINGLE 18IN (TOURNIQUET CUFF) ×1 IMPLANT
DECANTER SPIKE VIAL GLASS SM (MISCELLANEOUS) IMPLANT
DRAPE EXTREMITY T 121X128X90 (DRAPE) ×2 IMPLANT
DRAPE SURG 17X23 STRL (DRAPES) ×2 IMPLANT
GAUZE SPONGE 4X4 12PLY STRL (GAUZE/BANDAGES/DRESSINGS) ×2 IMPLANT
GAUZE XEROFORM 1X8 LF (GAUZE/BANDAGES/DRESSINGS) ×2 IMPLANT
GLOVE BIOGEL PI IND STRL 7.0 (GLOVE) IMPLANT
GLOVE BIOGEL PI IND STRL 8.5 (GLOVE) ×1 IMPLANT
GLOVE BIOGEL PI INDICATOR 7.0 (GLOVE) ×1
GLOVE BIOGEL PI INDICATOR 8.5 (GLOVE) ×1
GLOVE SURG ORTHO 8.0 STRL STRW (GLOVE) ×1 IMPLANT
GLOVE SURG SS PI 7.0 STRL IVOR (GLOVE) ×1 IMPLANT
GLOVE SURG SS PI 8.0 STRL IVOR (GLOVE) ×1 IMPLANT
GOWN STRL REUS W/ TWL LRG LVL3 (GOWN DISPOSABLE) ×1 IMPLANT
GOWN STRL REUS W/TWL LRG LVL3 (GOWN DISPOSABLE) ×2
GOWN STRL REUS W/TWL XL LVL3 (GOWN DISPOSABLE) ×2 IMPLANT
NDL PRECISIONGLIDE 27X1.5 (NEEDLE) ×1 IMPLANT
NEEDLE PRECISIONGLIDE 27X1.5 (NEEDLE) ×2 IMPLANT
NS IRRIG 1000ML POUR BTL (IV SOLUTION) ×2 IMPLANT
PACK BASIN DAY SURGERY FS (CUSTOM PROCEDURE TRAY) ×2 IMPLANT
STOCKINETTE 4X48 STRL (DRAPES) ×2 IMPLANT
SUT ETHILON 4 0 PS 2 18 (SUTURE) ×2 IMPLANT
SYR BULB 3OZ (MISCELLANEOUS) ×2 IMPLANT
SYR CONTROL 10ML LL (SYRINGE) ×2 IMPLANT
TOWEL OR 17X24 6PK STRL BLUE (TOWEL DISPOSABLE) ×3 IMPLANT
UNDERPAD 30X30 (UNDERPADS AND DIAPERS) ×1 IMPLANT

## 2015-04-13 NOTE — Anesthesia Postprocedure Evaluation (Signed)
Anesthesia Post Note  Patient: Brittany Taylor  Procedure(s) Performed: Procedure(s) (LRB): RELEASE A-1 PULLEY RIGHT RING FINGER (Right)  Anesthesia type: MAC  Patient location: PACU  Post pain: Pain level controlled  Post assessment: Patient's Cardiovascular Status Stable  Last Vitals:  Filed Vitals:   04/13/15 0925  BP: 132/76  Pulse: 74  Temp: 36.7 C  Resp: 16    Post vital signs: Reviewed and stable  Level of consciousness: sedated  Complications: No apparent anesthesia complications

## 2015-04-13 NOTE — Transfer of Care (Signed)
Immediate Anesthesia Transfer of Care Note  Patient: Brittany Taylor  Procedure(s) Performed: Procedure(s): RELEASE A-1 PULLEY RIGHT RING FINGER (Right)  Patient Location: PACU  Anesthesia Type:Bier block  Level of Consciousness: sedated and patient cooperative  Airway & Oxygen Therapy: Patient Spontanous Breathing and Patient connected to face mask oxygen  Post-op Assessment: Report given to RN and Post -op Vital signs reviewed and stable  Post vital signs: Reviewed and stable  Last Vitals:  Filed Vitals:   04/13/15 0723  BP: 124/75  Pulse: 83  Temp: 36.6 C  Resp: 20    Complications: No apparent anesthesia complications

## 2015-04-13 NOTE — Op Note (Signed)
Dictation Number [id number]

## 2015-04-13 NOTE — Addendum Note (Signed)
Addendum  created 04/13/15 1016 by Duane Boston, MD   Modules edited: Flowsheet VN   Flowsheet VN:  [id number] SCORE

## 2015-04-13 NOTE — Anesthesia Preprocedure Evaluation (Addendum)
Anesthesia Evaluation  Patient identified by MRN, date of birth, ID band Patient awake    Reviewed: Allergy & Precautions, NPO status , Patient's Chart, lab work & pertinent test results  History of Anesthesia Complications Negative for: history of anesthetic complications  Airway Mallampati: II  TM Distance: >3 FB Neck ROM: Full    Dental  (+) Teeth Intact, Dental Advisory Given   Pulmonary asthma ,    Pulmonary exam normal       Cardiovascular hypertension, Pt. on medications Normal cardiovascular exam    Neuro/Psych PSYCHIATRIC DISORDERS Depression negative neurological ROS     GI/Hepatic negative GI ROS, Neg liver ROS,   Endo/Other  diabetes  Renal/GU negative Renal ROS     Musculoskeletal   Abdominal   Peds  Hematology   Anesthesia Other Findings   Reproductive/Obstetrics                           Anesthesia Physical Anesthesia Plan  ASA: II  Anesthesia Plan: MAC and Bier Block   Post-op Pain Management:    Induction:   Airway Management Planned: Simple Face Mask  Additional Equipment:   Intra-op Plan:   Post-operative Plan:   Informed Consent: I have reviewed the patients History and Physical, chart, labs and discussed the procedure including the risks, benefits and alternatives for the proposed anesthesia with the patient or authorized representative who has indicated his/her understanding and acceptance.   Dental advisory given  Plan Discussed with: CRNA, Anesthesiologist and Surgeon  Anesthesia Plan Comments:        Anesthesia Quick Evaluation

## 2015-04-13 NOTE — Brief Op Note (Signed)
04/13/2015  9:02 AM  PATIENT:  Brittany Taylor  59 y.o. female  PRE-OPERATIVE DIAGNOSIS:  STENOSING TENOSYNOVITIS RIGHT RING FINGER  POST-OPERATIVE DIAGNOSIS:  STENOSING TENOSYNOVITIS RIGHT RING FINGER  PROCEDURE:  Procedure(s): RELEASE A-1 PULLEY RIGHT RING FINGER (Right)  SURGEON:  Surgeon(s) and Role:    * Daryll Brod, MD - Primary  PHYSICIAN ASSISTANT:   ASSISTANTS: none   ANESTHESIA:   local and regional  EBL:  Total I/O In: 800 [I.V.:800] Out: -   BLOOD ADMINISTERED:none  DRAINS: none   LOCAL MEDICATIONS USED:  BUPIVICAINE   SPECIMEN:  No Specimen  DISPOSITION OF SPECIMEN:  N/A  COUNTS:  YES  TOURNIQUET:   Total Tourniquet Time Documented: Forearm (Right) - 17 minutes Total: Forearm (Right) - 17 minutes   DICTATION: .Other Dictation: Dictation Number [id number]  PLAN OF CARE: Discharge to home after PACU  PATIENT DISPOSITION:  PACU - hemodynamically stable.

## 2015-04-13 NOTE — Op Note (Signed)
[name], [name] NO.:  [id number]  MEDICAL RECORD NO.:  [id number]  LOCATION:                                 FACILITY:  PHYSICIAN:  Daryll Brod, M.D.       DATE OF BIRTH:  11-30-1955  DATE OF PROCEDURE:  04/13/2015 DATE OF DISCHARGE:                              OPERATIVE REPORT   POSTOPERATIVE DIAGNOSIS:  Stenosing tenosynovitis, right ring finger.  POSTOPERATIVE DIAGNOSIS:  Stenosing tenosynovitis, right ring finger.  OPERATION:  Release of A1 pulley, right ring finger.  SURGEON:  Daryll Brod, MD.  ANESTHESIA:  Forearm-based IV regional with local infiltration.  ANESTHESIOLOGIST:  Dr. Tobias Alexander.  HISTORY:  The patient is a 59 year old female with a history of diabetes who was suffering from triggering of her right ring finger.  She has elected to undergo surgical release of the A1 pulley.  Pre, peri, postoperative course have been discussed along with the risks and complications.  She is aware that there is no guarantee with the surgery, possibility of infection, recurrence of injury to arteries, nerves, tendons, incomplete relief of symptoms, dystrophy.  In preoperative area, the patient is seen, the extremity marked by both patient and surgeon.  Antibiotic given.  PROCEDURE IN DETAIL:  The patient was brought to the operating room, where a forearm-based IV regional anesthetic was carried out without difficulty.  She was prepped using ChloraPrep, supine position with the right arm free.  A 3-minute dry time was allowed.  Time-out taken, confirming the patient and procedure.  An oblique incision was made over the A1 pulley of the right ring finger carried down through subcutaneous tissue.  Retractors placed.  Neurovascular bundles identified radially and ulnarly.  The A1 pulley was thickened.  This was released on its radial aspect.  A small incision made centrally in A2.  A partial tenosynovectomy performed proximally allowing separation with 2  flexor tendons passively through a full range motion, no further triggering was noted.  The wound was copiously irrigated with saline.  The skin then closed with interrupted 4-0 nylon sutures.  A local infiltration was 6 mL of 0.25% bupivacaine without epinephrine was given.  A sterile compressive dressing with fingers free was applied.  On deflation of the tourniquet, all fingers immediately pinked.  She was taken to the recovery room for observation in satisfactory condition.  She will be discharged home to return to the [facility] in 1 week on Nor[facility].          ______________________________ Daryll Brod, M.D.     GK/MEDQ  D:  04/13/2015  T:  04/13/2015  Job:  [id number]

## 2015-04-13 NOTE — Discharge Instructions (Addendum)

## 2015-04-13 NOTE — H&P (Signed)
Brittany Taylor is a 59 year-old right-hand dominant female with triggering of her right ring finger.  This has been going on for approximately four months. She is a Therapist, sports for [employer].  She has had bilateral thumbs released by myself years ago.  She has diabetes with an A1C of 6.8 controlled by oral medicine.  She has history of psoriasis and history of hypertension.  She recalls no specific history of injury.  She states in the morning she will frequently have to straighten her ring finger out.  She is complaining of swelling.  She has tried ibuprofen without relief.   ALLERGIES:   Morphine, Altace and mercurochrome.   MEDICATIONS:    Metformin, triamterene, HCTZ, fluoxetine, Symbicort, cetirizine, allergy shots, atorvastatin.  SURGICAL HISTORY:    She relates no other surgery.  FAMILY MEDICAL HISTORY:    Positive for diabetes, high blood pressure and arthritis.  SOCIAL HISTORY:     She does not smoke or drink.  She is single, a Equities trader.  REVIEW OF SYSTEMS:    Positive for glasses, contacts, high blood pressure, psoriasis, depression, otherwise negative 14 points.  Brittany Taylor is an 59 y.o. female.   Chief Complaint: STS right ring finger HPI: see above  Past Medical History  Diagnosis Date  . Psoriasis     arms, bilateral leg, back, breast, neck, ears  . Non-insulin dependent type 2 diabetes mellitus   . High cholesterol   . Depression   . Hypertension     states under control with med., has been on med. x 10 yr.  . Allergy-induced asthma     daily inhaler  . Dental crown present   . Stenosing tenosynovitis of finger of right hand 03/2015    right ring finger    Past Surgical History  Procedure Laterality Date  . Leep  03/18/2003  . Polypectomy  01/2015    uterine  . Trigger finger release Left 06/15/2009    thumb  . Trigger finger release Right 11/15/2008    thumb    History reviewed. No pertinent family history. Social History:  reports that she  has never smoked. She has never used smokeless tobacco. She reports that she drinks alcohol. She reports that she does not use illicit drugs.  Allergies:  Allergies  Allergen Reactions  . Morphine Itching and Nausea And Vomiting  . Ramipril Other (See Comments)    ANGIOEDEMA  . Latex Rash    Used latex gloves with power, rash occured  . Mercurochrome [Merbromin] Rash    Medications Prior to Admission  Medication Sig Dispense Refill  . aspirin 81 MG tablet Take 81 mg by mouth daily.    Marland Kitchen atorvastatin (LIPITOR) 40 MG tablet Take 1 tablet (40 mg total) by mouth daily. 90 tablet 3  . azelastine (OPTIVAR) 0.05 % ophthalmic solution     . budesonide-formoterol (SYMBICORT) 160-4.5 MCG/ACT inhaler Inhale 1 puff into the lungs daily. 1-2 puffs two times a day    . FLUoxetine (PROZAC) 20 MG capsule Take 2 capsules (40 mg total) by mouth daily. 180 capsule 3  . guanFACINE (TENEX) 1 MG tablet Take 1 tablet (1 mg total) by mouth daily. 90 tablet 3  . ibuprofen (ADVIL,MOTRIN) 800 MG tablet Take 800 mg by mouth every 8 (eight) hours as needed.    . metFORMIN (GLUCOPHAGE) 1000 MG tablet Take 2,000 mg by mouth daily.    . NON FORMULARY Inject into the skin once a week. Allergy shots    .  triamterene-hydrochlorothiazide (MAXZIDE-25) 37.5-25 MG per tablet Take 1 tablet by mouth daily. 90 tablet 3  . glucose blood (ONETOUCH VERIO) test strip 1 each by Other route daily. And lancets 1/day 250.00 100 each 12    Results for orders placed or performed during the hospital encounter of 04/13/15 (from the past 48 hour(s))  Basic metabolic panel     Status: None   Collection Time: 04/11/15  3:25 PM  Result Value Ref Range   Sodium 139 135 - 145 mmol/L   Potassium 4.0 3.5 - 5.1 mmol/L   Chloride 101 101 - 111 mmol/L   CO2 29 22 - 32 mmol/L   Glucose, Bld 81 65 - 99 mg/dL   BUN 17 6 - 20 mg/dL   Creatinine, Ser 0.65 0.44 - 1.00 mg/dL   Calcium 9.7 8.9 - 10.3 mg/dL   GFR calc non Af Amer >60 >60 mL/min    GFR calc Af Amer >60 >60 mL/min    Comment: (NOTE) The eGFR has been calculated using the CKD EPI equation. This calculation has not been validated in all clinical situations. eGFR's persistently <60 mL/min signify possible Chronic Kidney Disease.    Anion gap 9 5 - 15    No results found.   Pertinent items are noted in HPI.  Blood pressure 124/75, pulse 83, temperature 97.9 F (36.6 C), temperature source Oral, resp. rate 20, height 5' 4.75" (1.645 m), weight 86.637 kg (191 lb), SpO2 100 %.  General appearance: alert, cooperative and appears stated age Head: Normocephalic, without obvious abnormality Neck: no JVD Resp: clear to auscultation bilaterally Cardio: regular rate and rhythm, S1, S2 normal, no murmur, click, rub or gallop GI: soft, non-tender; bowel sounds normal; no masses,  no organomegaly Extremities: catching right ring finger Pulses: 2+ and symmetric Skin: Skin color, texture, turgor normal. No rashes or lesions Neurologic: Grossly normal Incision/Wound: na  Assessment/Plan DIAGNOSIS:      Stenosing tenosynovitis, right ring finger.  RECOMMENDATIONS/PLAN:    We have discussed the possibility of injection with her with the possibility of alteration of her blood sugars vs. surgical intervention.  Her diabetes and failure of injections on her thumbs.  She would like to proceed to surgical release of the A-1 pulley right ring finger.  The pre, peri and postoperative course were discussed along with the risks and complications.  The patient is aware there is no guarantee with the surgery, possibility of infection, recurrence, injury to arteries, nerves, tendons, incomplete relief of symptoms and dystrophy.   She is scheduled for release A-1 pulley right ring finger as an outpatient under regional anesthesia.  Brittany Taylor R 04/13/2015, 7:46 AM

## 2015-04-14 ENCOUNTER — Encounter (HOSPITAL_BASED_OUTPATIENT_CLINIC_OR_DEPARTMENT_OTHER): Payer: Self-pay | Admitting: Orthopedic Surgery

## 2015-07-11 ENCOUNTER — Encounter: Payer: Self-pay | Admitting: Gastroenterology

## 2015-08-10 ENCOUNTER — Encounter: Payer: Self-pay | Admitting: Endocrinology

## 2015-08-10 ENCOUNTER — Ambulatory Visit (INDEPENDENT_AMBULATORY_CARE_PROVIDER_SITE_OTHER): Payer: BLUE CROSS/BLUE SHIELD | Admitting: Endocrinology

## 2015-08-10 VITALS — BP 112/80 | HR 84 | Temp 98.3°F | Ht 65.0 in | Wt 190.0 lb

## 2015-08-10 DIAGNOSIS — Z0000 Encounter for general adult medical examination without abnormal findings: Secondary | ICD-10-CM

## 2015-08-10 DIAGNOSIS — Z23 Encounter for immunization: Secondary | ICD-10-CM | POA: Diagnosis not present

## 2015-08-10 DIAGNOSIS — E119 Type 2 diabetes mellitus without complications: Secondary | ICD-10-CM

## 2015-08-10 LAB — CBC WITH DIFFERENTIAL/PLATELET
BASOS PCT: 0.6 % (ref 0.0–3.0)
Basophils Absolute: 0 10*3/uL (ref 0.0–0.1)
EOS ABS: 0.1 10*3/uL (ref 0.0–0.7)
Eosinophils Relative: 3.7 % (ref 0.0–5.0)
HCT: 35.4 % — ABNORMAL LOW (ref 36.0–46.0)
HEMOGLOBIN: 11.8 g/dL — AB (ref 12.0–15.0)
Lymphocytes Relative: 37.8 % (ref 12.0–46.0)
Lymphs Abs: 1 10*3/uL (ref 0.7–4.0)
MCHC: 33.2 g/dL (ref 30.0–36.0)
MCV: 90.2 fl (ref 78.0–100.0)
Monocytes Absolute: 0.3 10*3/uL (ref 0.1–1.0)
Monocytes Relative: 12.7 % — ABNORMAL HIGH (ref 3.0–12.0)
NEUTROS ABS: 1.2 10*3/uL — AB (ref 1.4–7.7)
Neutrophils Relative %: 45.2 % (ref 43.0–77.0)
PLATELETS: 325 10*3/uL (ref 150.0–400.0)
RBC: 3.93 Mil/uL (ref 3.87–5.11)
RDW: 15.3 % (ref 11.5–15.5)
WBC: 2.7 10*3/uL — AB (ref 4.0–10.5)

## 2015-08-10 LAB — LIPID PANEL
CHOL/HDL RATIO: 3
Cholesterol: 178 mg/dL (ref 0–200)
HDL: 56.1 mg/dL (ref >39.00)
LDL CALC: 110 mg/dL — AB (ref 0–99)
NonHDL: 122.22
Triglycerides: 61 mg/dL (ref 0.0–149.0)
VLDL: 12.2 mg/dL (ref 0.0–40.0)

## 2015-08-10 LAB — HEPATIC FUNCTION PANEL
ALT: 21 U/L (ref 0–35)
AST: 21 U/L (ref 0–37)
Albumin: 4 g/dL (ref 3.5–5.2)
Alkaline Phosphatase: 82 U/L (ref 39–117)
Bilirubin, Direct: 0.1 mg/dL (ref 0.0–0.3)
Total Bilirubin: 0.4 mg/dL (ref 0.2–1.2)
Total Protein: 7.6 g/dL (ref 6.0–8.3)

## 2015-08-10 LAB — URINALYSIS, ROUTINE W REFLEX MICROSCOPIC
Bilirubin Urine: NEGATIVE
Hgb urine dipstick: NEGATIVE
KETONES UR: NEGATIVE
Leukocytes, UA: NEGATIVE
Nitrite: NEGATIVE
PH: 6 (ref 5.0–8.0)
RBC / HPF: NONE SEEN (ref 0–?)
SPECIFIC GRAVITY, URINE: 1.025 (ref 1.000–1.030)
Total Protein, Urine: NEGATIVE
URINE GLUCOSE: NEGATIVE
Urobilinogen, UA: 0.2 (ref 0.0–1.0)

## 2015-08-10 LAB — BASIC METABOLIC PANEL
BUN: 19 mg/dL (ref 6–23)
CALCIUM: 9.3 mg/dL (ref 8.4–10.5)
CO2: 30 mEq/L (ref 19–32)
Chloride: 103 mEq/L (ref 96–112)
Creatinine, Ser: 0.79 mg/dL (ref 0.40–1.20)
GFR: 95.8 mL/min (ref >60.00)
Glucose, Bld: 108 mg/dL — ABNORMAL HIGH (ref 70–99)
POTASSIUM: 3.9 meq/L (ref 3.5–5.1)
SODIUM: 139 meq/L (ref 135–145)

## 2015-08-10 LAB — TSH: TSH: 2.09 u[IU]/mL (ref 0.35–4.50)

## 2015-08-10 LAB — POCT GLYCOSYLATED HEMOGLOBIN (HGB A1C): Hemoglobin A1C: 6.2

## 2015-08-10 MED ORDER — GLUCOSE BLOOD VI STRP: 1.0000 | ORAL_STRIP | Freq: Every day | Status: AC

## 2015-08-10 NOTE — Progress Notes (Signed)
Subjective:    Patient ID: Brittany Taylor, female    DOB: 1956/03/10, 59 y.o.   MRN: [id number]  HPI   Pt is here for regular wellness examination, and is feeling pretty well in general, and says chronic med probs are stable, except as noted below. Past Medical History  Diagnosis Date  . Psoriasis     arms, bilateral leg, back, breast, neck, ears  . Non-insulin dependent type 2 diabetes mellitus   . High cholesterol   . Depression   . Hypertension     states under control with med., has been on med. x 10 yr.  . Allergy-induced asthma     daily inhaler  . Dental crown present   . Stenosing tenosynovitis of finger of right hand 03/2015    right ring finger    Past Surgical History  Procedure Laterality Date  . Leep  03/18/2003  . Polypectomy  01/2015    uterine  . Trigger finger release Left 06/15/2009    thumb  . Trigger finger release Right 11/15/2008    thumb  . Trigger finger release Right 04/13/2015    Procedure: RELEASE A-1 PULLEY RIGHT RING FINGER;  Surgeon: Daryll Brod, MD;  Location: [place];  Service: Orthopedics;  Laterality: Right;    Social History   Social History  . Marital Status: Single    Spouse Name: N/A  . Number of Children: N/A  . Years of Education: N/A   Occupational History  . RN    Social History Main Topics  . Smoking status: Never Smoker   . Smokeless tobacco: Never Used  . Alcohol Use: Yes     Comment: occasionally  . Drug Use: No  . Sexual Activity: Not on file   Other Topics Concern  . Not on file   Social History Narrative    Current Outpatient Prescriptions on File Prior to Visit  Medication Sig Dispense Refill  . aspirin 81 MG tablet Take 81 mg by mouth daily.    Marland Kitchen atorvastatin (LIPITOR) 40 MG tablet Take 1 tablet (40 mg total) by mouth daily. 90 tablet 3  . azelastine (OPTIVAR) 0.05 % ophthalmic solution     . budesonide-formoterol (SYMBICORT) 160-4.5 MCG/ACT inhaler Inhale 1 puff into the lungs daily.  1-2 puffs two times a Taylor    . FLUoxetine (PROZAC) 20 MG capsule Take 2 capsules (40 mg total) by mouth daily. 180 capsule 3  . guanFACINE (TENEX) 1 MG tablet Take 1 tablet (1 mg total) by mouth daily. 90 tablet 3  . HYDROcodone-acetaminophen (NORCO) 5-325 MG per tablet Take 1 tablet by mouth every 6 (six) hours as needed for moderate pain. 30 tablet 0  . ibuprofen (ADVIL,MOTRIN) 800 MG tablet Take 800 mg by mouth every 8 (eight) hours as needed.    . metFORMIN (GLUCOPHAGE) 1000 MG tablet Take 2,000 mg by mouth daily.    . NON FORMULARY Inject into the skin once a week. Allergy shots    . triamterene-hydrochlorothiazide (MAXZIDE-25) 37.5-25 MG per tablet Take 1 tablet by mouth daily. 90 tablet 3   No current facility-administered medications on file prior to visit.    Allergies  Allergen Reactions  . Morphine Itching and Nausea And Vomiting  . Ramipril Other (See Comments)    ANGIOEDEMA  . Latex Rash    Used latex gloves with power, rash occured  . Mercurochrome [Merbromin] Rash    No family history on file.  BP 112/80 mmHg  Pulse 84  Temp(Src) 98.3 F (36.8 C) (Oral)  Ht 5\' 5"  (1.651 m)  Wt 190 lb (86.183 kg)  BMI 31.62 kg/m2  SpO2 96%  Review of Systems  Constitutional: Negative for unexpected weight change.  HENT: Negative for hearing loss.   Eyes: Negative for visual disturbance.  Respiratory: Negative for shortness of breath.   Cardiovascular: Negative for chest pain.  Gastrointestinal: Negative for blood in stool.  Endocrine: Negative for cold intolerance.  Genitourinary: Negative for hematuria.  Musculoskeletal: Negative for back pain.  Skin: Negative for wound.  Allergic/Immunologic: Negative for environmental allergies.  Neurological: Negative for syncope and numbness.  Hematological: Does not bruise/bleed easily.  Psychiatric/Behavioral: Negative for dysphoric mood.       Objective:   Physical Exam VS: see vs page GEN: no distress HEAD: head: no  deformity eyes: no periorbital swelling; slight bilat proptosis external nose and ears are normal mouth: no lesion seen NECK: supple, thyroid is not enlarged CHEST WALL: no deformity LUNGS:  Clear to auscultation BREASTS: sees gyn.   ABD: abdomen is soft, nontender.  no hepatosplenomegaly.  not distended.  no hernia GENITALIA/RECTAL: sees gyn MUSCULOSKELETAL: muscle bulk and strength are grossly normal.  no obvious joint swelling.  gait is normal and steady EXTEMITIES: no deformity.  no ulcer on the feet.  feet are of normal color and temp.  no edema PULSES: dorsalis pedis intact bilat.  no carotid bruit NEURO:  cn 2-12 grossly intact.   readily moves all 4's.  sensation is intact to touch on the feet SKIN:  Normal texture and temperature.  No rash or suspicious lesion is visible.  Severe psoriasis on the arms and legs NODES:  None palpable at the neck PSYCH: alert, well-oriented.  Does not appear anxious nor depressed.     i personally reviewed electrocardiogram tracing (today): Indication: DM Impression: QS complexes in V1--prob normal (no change since 08/09/14)    Assessment & Plan:  Wellness visit today, with problems stable, except as noted.   Patient is advised the following: Patient Instructions  please consider these measures for your health:  minimize alcohol.  do not use tobacco products.  have a colonoscopy at least every 10 years from age 72.  Women should have an annual mammogram from age 60.  keep firearms safely stored.  always use seat belts.  have working smoke alarms in your home.  see an eye doctor and dentist regularly.  never drive under the influence of alcohol or drugs (including prescription drugs).   blood tests are requested for you today.  We'll let you know about the results. Please return in 1 year.   SEPARATE EVALUATION FOLLOWS--EACH PROBLEM HERE IS NEW, NOT RESPONDING TO TREATMENT, OR POSES SIGNIFICANT RISK TO THE PATIENT'S HEALTH: HISTORY OF THE  PRESENT ILLNESS: Leukopenia: she has not required rx or bone marrow Dyslipidemia: she has no clinically evident ascvd PAST MEDICAL HISTORY reviewed and up to date today REVIEW OF SYSTEMS: Denies fever PHYSICAL EXAMINATION: VITAL SIGNS:  See vs page GENERAL: no distress HEART:  Regular rate and rhythm without murmurs noted. Normal S1,S2.   LAB/XRAY RESULTS: Lab Results  Component Value Date   CHOL 178 08/10/2015   HDL 56.10 08/10/2015   LDLCALC 110* 08/10/2015   TRIG 61.0 08/10/2015   CHOLHDL 3 08/10/2015   Lab Results  Component Value Date   WBC 2.7* 08/10/2015   HGB 11.8* 08/10/2015   HCT 35.4* 08/10/2015   MCV 90.2 08/10/2015   PLT 325.0 08/10/2015   IMPRESSION: Dyslipidemia:  worse Anemia/leukopemia, persistent. PLAN: pt is advised to resume lipitor We'll follow cbc

## 2015-08-10 NOTE — Patient Instructions (Signed)
please consider these measures for your health:  minimize alcohol.  do not use tobacco products.  have a colonoscopy at least every 10 years from age 59.  Women should have an annual mammogram from age 40.  keep firearms safely stored.  always use seat belts.  have working smoke alarms in your home.  see an eye doctor and dentist regularly.  never drive under the influence of alcohol or drugs (including prescription drugs).   blood tests are requested for you today.  We'll let you know about the results.   Please return in 1 year.    

## 2015-08-23 ENCOUNTER — Other Ambulatory Visit: Payer: Self-pay | Admitting: Endocrinology

## 2015-08-26 ENCOUNTER — Other Ambulatory Visit: Payer: Self-pay | Admitting: Endocrinology

## 2015-10-02 ENCOUNTER — Other Ambulatory Visit: Payer: Self-pay | Admitting: Endocrinology

## 2015-10-25 ENCOUNTER — Other Ambulatory Visit: Payer: Self-pay | Admitting: Endocrinology

## 2015-10-26 NOTE — Telephone Encounter (Signed)
Please advise if ok to refill. Thanks 

## 2015-10-27 ENCOUNTER — Other Ambulatory Visit: Payer: Self-pay | Admitting: Endocrinology

## 2015-12-09 ENCOUNTER — Other Ambulatory Visit: Payer: Self-pay | Admitting: Endocrinology

## 2016-01-15 ENCOUNTER — Other Ambulatory Visit: Payer: Self-pay | Admitting: Endocrinology

## 2016-02-10 ENCOUNTER — Other Ambulatory Visit: Payer: Self-pay | Admitting: Endocrinology

## 2016-02-17 ENCOUNTER — Other Ambulatory Visit: Payer: Self-pay | Admitting: Endocrinology

## 2016-03-06 ENCOUNTER — Other Ambulatory Visit: Payer: Self-pay | Admitting: Endocrinology

## 2016-03-24 ENCOUNTER — Other Ambulatory Visit: Payer: Self-pay | Admitting: Endocrinology

## 2016-03-25 NOTE — Telephone Encounter (Signed)
Please advise if ok to refill. Last appointment was 08/10/2015.

## 2016-04-23 ENCOUNTER — Other Ambulatory Visit: Payer: Self-pay | Admitting: Endocrinology

## 2016-06-05 ENCOUNTER — Other Ambulatory Visit: Payer: Self-pay | Admitting: Endocrinology

## 2016-06-24 ENCOUNTER — Other Ambulatory Visit: Payer: Self-pay | Admitting: Endocrinology

## 2016-07-16 ENCOUNTER — Other Ambulatory Visit: Payer: Self-pay | Admitting: Endocrinology

## 2016-08-09 ENCOUNTER — Ambulatory Visit (INDEPENDENT_AMBULATORY_CARE_PROVIDER_SITE_OTHER): Payer: Self-pay | Admitting: Endocrinology

## 2016-08-09 ENCOUNTER — Encounter: Payer: Self-pay | Admitting: Endocrinology

## 2016-08-09 VITALS — BP 124/78 | HR 75 | Wt 193.0 lb

## 2016-08-09 DIAGNOSIS — E119 Type 2 diabetes mellitus without complications: Secondary | ICD-10-CM

## 2016-08-09 DIAGNOSIS — E042 Nontoxic multinodular goiter: Secondary | ICD-10-CM

## 2016-08-09 DIAGNOSIS — Z23 Encounter for immunization: Secondary | ICD-10-CM

## 2016-08-09 LAB — BASIC METABOLIC PANEL
BUN: 18 mg/dL (ref 6–23)
CALCIUM: 9.4 mg/dL (ref 8.4–10.5)
CO2: 32 meq/L (ref 19–32)
CREATININE: 0.83 mg/dL (ref 0.40–1.20)
Chloride: 104 mEq/L (ref 96–112)
GFR: 90.18 mL/min (ref >60.00)
Glucose, Bld: 73 mg/dL (ref 70–99)
Potassium: 4.1 mEq/L (ref 3.5–5.1)
Sodium: 141 mEq/L (ref 135–145)

## 2016-08-09 LAB — TSH: TSH: 2.19 u[IU]/mL (ref 0.35–4.50)

## 2016-08-09 LAB — HEMOGLOBIN A1C: HEMOGLOBIN A1C: 6.8 % — AB (ref 4.6–6.5)

## 2016-08-09 MED ORDER — TRIAMTERENE-HCTZ 37.5-25 MG PO TABS: 1.0000 | ORAL_TABLET | Freq: Every day | ORAL | 3 refills | Status: DC

## 2016-08-09 MED ORDER — FLUOXETINE HCL 20 MG PO CAPS: ORAL_CAPSULE | ORAL | 3 refills | Status: DC

## 2016-08-09 MED ORDER — ATORVASTATIN CALCIUM 40 MG PO TABS: 40.0000 mg | ORAL_TABLET | Freq: Every day | ORAL | 3 refills | Status: DC

## 2016-08-09 MED ORDER — GUANFACINE HCL 1 MG PO TABS: 1.0000 mg | ORAL_TABLET | Freq: Every day | ORAL | 3 refills | Status: DC

## 2016-08-09 MED ORDER — METFORMIN HCL ER 500 MG PO TB24: ORAL_TABLET | ORAL | 3 refills | Status: DC

## 2016-08-09 NOTE — Patient Instructions (Addendum)
I have sent a prescription to your pharmacy, to refill your medications. blood tests are requested for you today.  We'll let you know about the results. I realize you do not have health insurance, but there are many other services you need for your health.  These include screening tests for colon, prostate and/or breast cancer; electrocardiogram, and others.  Please let me know if you want to get these important tests done.   Please return in 1 year.

## 2016-08-09 NOTE — Progress Notes (Signed)
Subjective:    Patient ID: Brittany Taylor, female    DOB: March 09, 1956, 60 y.o.   MRN: [id number]  HPI She does not have health insurance now.  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 The state of at least three ongoing medical problems is addressed today, with interval history of each noted here: dyslipidemia: she denies chest pain HTN: she denies sob Type 2 DM: she denies weight change Past Medical History:  Diagnosis Date  . Allergy-induced asthma    daily inhaler  . Dental crown present   . Depression   . High cholesterol   . Hypertension    states under control with med., has been on med. x 10 yr.  . Non-insulin dependent type 2 diabetes mellitus ([place])   . Psoriasis    arms, bilateral leg, back, breast, neck, ears  . Stenosing tenosynovitis of finger of right hand 03/2015   right ring finger    Past Surgical History:  Procedure Laterality Date  . LEEP  03/18/2003  . POLYPECTOMY  01/2015   uterine  . TRIGGER FINGER RELEASE Left 06/15/2009   thumb  . TRIGGER FINGER RELEASE Right 11/15/2008   thumb  . TRIGGER FINGER RELEASE Right 04/13/2015   Procedure: RELEASE A-1 PULLEY RIGHT RING FINGER;  Surgeon: Daryll Brod, MD;  Location: [place];  Service: Orthopedics;  Laterality: Right;    Social History   Social History  . Marital status: Single    Spouse name: N/A  . Number of children: N/A  . Years of education: N/A   Occupational History  . RN [employer]   Social History Main Topics  . Smoking status: Never Smoker  . Smokeless tobacco: Never Used  . Alcohol use Yes     Comment: occasionally  . Drug use: No  . Sexual activity: Not on file   Other Topics Concern  . Not on file   Social History Narrative  . No narrative on file    Current Outpatient Prescriptions on File Prior to Visit  Medication Sig Dispense Refill  . aspirin 81 MG tablet Take 81 mg by mouth daily.    Marland Kitchen azelastine (OPTIVAR) 0.05 % ophthalmic solution      . budesonide-formoterol (SYMBICORT) 160-4.5 MCG/ACT inhaler Inhale 1 puff into the lungs daily. 1-2 puffs two times a day    . glucose blood (ONETOUCH VERIO) test strip 1 each by Other route daily. And lancets 1/day 250.00 100 each 12  . hydrOXYzine (ATARAX/VISTARIL) 10 MG tablet   0  . ibuprofen (ADVIL,MOTRIN) 800 MG tablet Take 800 mg by mouth every 8 (eight) hours as needed.    . NON FORMULARY Inject into the skin once a week. Allergy shots    . triamcinolone ointment (KENALOG) 0.1 %   0   No current facility-administered medications on file prior to visit.     Allergies  Allergen Reactions  . Morphine Itching and Nausea And Vomiting  . Ramipril Other (See Comments)    ANGIOEDEMA  . Latex Rash    Used latex gloves with power, rash occured  . Mercurochrome [Merbromin] Rash    No family history on file.  BP 124/78 (BP Location: Left Arm, Patient Position: Sitting)   Pulse 75   Wt 193 lb (87.5 kg)   SpO2 96%   BMI 32.12 kg/m    Review of Systems denies edema and dizziness.    Objective:   Physical Exam VS: see vs page  GEN: no distress HEAD: head: no deformity eyes: no periorbital swelling, no proptosis external nose and ears are normal NECK: supple, thyroid is not enlarged CHEST WALL: no deformity LUNGS: clear to auscultation CV: reg rate and rhythm, no murmur MUSCULOSKELETAL: muscle bulk and strength are grossly normal.  no obvious joint swelling.  gait is normal and steady EXTEMITIES: no deformity.  no ulcer on the feet.  feet are of normal color and temp.  no edema PULSES: dorsalis pedis intact bilat.  no carotid bruit NEURO:  cn 2-12 grossly intact.   readily moves all 4's.  sensation is intact to touch on the feet SKIN:  Normal texture and temperature.  No rash or suspicious lesion is visible.  Severe psoriasis of the legs NODES:  None palpable at the neck PSYCH: alert, well-oriented.  Does not appear anxious nor depressed.  Lab Results  Component Value  Date   WBC 2.7 (L) 08/10/2015   HGB 11.8 (L) 08/10/2015   HCT 35.4 (L) 08/10/2015   PLT 325.0 08/10/2015   GLUCOSE 73 08/09/2016   CHOL 178 08/10/2015   TRIG 61.0 08/10/2015   HDL 56.10 08/10/2015   LDLCALC 110 (H) 08/10/2015   ALT 21 08/10/2015   AST 21 08/10/2015   NA 141 08/09/2016   K 4.1 08/09/2016   CL 104 08/09/2016   CREATININE 0.83 08/09/2016   BUN 18 08/09/2016   CO2 32 08/09/2016   TSH 2.19 08/09/2016   HGBA1C 6.8 (H) 08/09/2016   MICROALBUR 0.7 08/09/2014      Assessment & Plan:  Type 2 DM: well-controlled HTN: well-controlled Dyslipidemia: recheck of labs is declined due to cost.   Please continue the same medications

## 2016-10-09 ENCOUNTER — Ambulatory Visit (HOSPITAL_COMMUNITY): Admission: EM | Admit: 2016-10-09 | Discharge: 2016-10-09 | Discharge status: Absent without leave | Payer: Self-pay

## 2016-12-16 ENCOUNTER — Encounter: Payer: Self-pay | Admitting: Endocrinology

## 2016-12-16 LAB — HM DIABETES EYE EXAM

## 2017-08-08 ENCOUNTER — Telehealth: Payer: Self-pay | Admitting: Endocrinology

## 2017-08-08 ENCOUNTER — Encounter: Payer: Self-pay | Admitting: Endocrinology

## 2017-08-08 ENCOUNTER — Ambulatory Visit (INDEPENDENT_AMBULATORY_CARE_PROVIDER_SITE_OTHER): Payer: BLUE CROSS/BLUE SHIELD | Admitting: Endocrinology

## 2017-08-08 VITALS — BP 112/82 | HR 75 | Wt 194.2 lb

## 2017-08-08 DIAGNOSIS — E042 Nontoxic multinodular goiter: Secondary | ICD-10-CM

## 2017-08-08 DIAGNOSIS — Z23 Encounter for immunization: Secondary | ICD-10-CM | POA: Diagnosis not present

## 2017-08-08 DIAGNOSIS — Z0000 Encounter for general adult medical examination without abnormal findings: Secondary | ICD-10-CM | POA: Diagnosis not present

## 2017-08-08 DIAGNOSIS — E119 Type 2 diabetes mellitus without complications: Secondary | ICD-10-CM | POA: Diagnosis not present

## 2017-08-08 LAB — CBC WITH DIFFERENTIAL/PLATELET
BASOS ABS: 0 10*3/uL (ref 0.0–0.1)
Basophils Relative: 1 % (ref 0.0–3.0)
Eosinophils Absolute: 0.1 10*3/uL (ref 0.0–0.7)
Eosinophils Relative: 2.9 % (ref 0.0–5.0)
HEMATOCRIT: 37.9 % (ref 36.0–46.0)
HEMOGLOBIN: 12.3 g/dL (ref 12.0–15.0)
LYMPHS PCT: 25.7 % (ref 12.0–46.0)
Lymphs Abs: 0.9 10*3/uL (ref 0.7–4.0)
MCHC: 32.5 g/dL (ref 30.0–36.0)
MCV: 91.5 fl (ref 78.0–100.0)
MONOS PCT: 10.1 % (ref 3.0–12.0)
Monocytes Absolute: 0.3 10*3/uL (ref 0.1–1.0)
NEUTROS PCT: 60.3 % (ref 43.0–77.0)
Neutro Abs: 2 10*3/uL (ref 1.4–7.7)
Platelets: 290 10*3/uL (ref 150.0–400.0)
RBC: 4.15 Mil/uL (ref 3.87–5.11)
RDW: 15.8 % — ABNORMAL HIGH (ref 11.5–15.5)
WBC: 3.3 10*3/uL — AB (ref 4.0–10.5)

## 2017-08-08 LAB — HEPATIC FUNCTION PANEL
ALBUMIN: 4.4 g/dL (ref 3.5–5.2)
ALT: 23 U/L (ref 0–35)
AST: 25 U/L (ref 0–37)
Alkaline Phosphatase: 87 U/L (ref 39–117)
Bilirubin, Direct: 0.1 mg/dL (ref 0.0–0.3)
TOTAL PROTEIN: 7.6 g/dL (ref 6.0–8.3)
Total Bilirubin: 0.5 mg/dL (ref 0.2–1.2)

## 2017-08-08 LAB — URINALYSIS, ROUTINE W REFLEX MICROSCOPIC
BILIRUBIN URINE: NEGATIVE
HGB URINE DIPSTICK: NEGATIVE
Ketones, ur: NEGATIVE
LEUKOCYTES UA: NEGATIVE
NITRITE: NEGATIVE
RBC / HPF: NONE SEEN (ref 0–?)
Specific Gravity, Urine: 1.015 (ref 1.000–1.030)
TOTAL PROTEIN, URINE-UPE24: NEGATIVE
Urine Glucose: NEGATIVE
Urobilinogen, UA: 0.2 (ref 0.0–1.0)
WBC UA: NONE SEEN (ref 0–?)
pH: 6 (ref 5.0–8.0)

## 2017-08-08 LAB — TSH: TSH: 2.1 u[IU]/mL (ref 0.35–4.50)

## 2017-08-08 LAB — LIPID PANEL
Cholesterol: 134 mg/dL (ref 0–200)
HDL: 54.1 mg/dL (ref >39.00)
LDL Cholesterol: 65 mg/dL (ref 0–99)
NONHDL: 79.8
Total CHOL/HDL Ratio: 2
Triglycerides: 76 mg/dL (ref 0.0–149.0)
VLDL: 15.2 mg/dL (ref 0.0–40.0)

## 2017-08-08 LAB — BASIC METABOLIC PANEL
BUN: 21 mg/dL (ref 6–23)
CALCIUM: 9.8 mg/dL (ref 8.4–10.5)
CO2: 30 meq/L (ref 19–32)
Chloride: 99 mEq/L (ref 96–112)
Creatinine, Ser: 0.83 mg/dL (ref 0.40–1.20)
GFR: 89.88 mL/min (ref >60.00)
Glucose, Bld: 93 mg/dL (ref 70–99)
Potassium: 3.9 mEq/L (ref 3.5–5.1)
SODIUM: 136 meq/L (ref 135–145)

## 2017-08-08 LAB — MICROALBUMIN / CREATININE URINE RATIO
Creatinine,U: 63.1 mg/dL
MICROALB/CREAT RATIO: 1.1 mg/g (ref 0.0–30.0)
Microalb, Ur: <0.7 mg/dL (ref 0.0–1.9)

## 2017-08-08 LAB — POCT GLYCOSYLATED HEMOGLOBIN (HGB A1C): HEMOGLOBIN A1C: 7

## 2017-08-08 MED ORDER — SITAGLIP PHOS-METFORMIN HCL ER 50-1000 MG PO TB24: 2.0000 | ORAL_TABLET | ORAL | 3 refills | Status: DC

## 2017-08-08 NOTE — Progress Notes (Signed)
Subjective:    Patient ID: Brittany Taylor, female    DOB: 02/28/1956, 61 y.o.   MRN: [id number]  HPI Pt is here for regular wellness examination, and is feeling pretty well in general, and says chronic med probs are stable, except as noted below Past Medical History:  Diagnosis Date  . Allergy-induced asthma    daily inhaler  . Dental crown present   . Depression   . High cholesterol   . Hypertension    states under control with med., has been on med. x 10 yr.  . Non-insulin dependent type 2 diabetes mellitus ([place])   . Psoriasis    arms, bilateral leg, back, breast, neck, ears  . Stenosing tenosynovitis of finger of right hand 03/2015   right ring finger    Past Surgical History:  Procedure Laterality Date  . LEEP  03/18/2003  . POLYPECTOMY  01/2015   uterine  . TRIGGER FINGER RELEASE Left 06/15/2009   thumb  . TRIGGER FINGER RELEASE Right 11/15/2008   thumb  . TRIGGER FINGER RELEASE Right 04/13/2015   Procedure: RELEASE A-1 PULLEY RIGHT RING FINGER;  Surgeon: Daryll Brod, MD;  Location: [place];  Service: Orthopedics;  Laterality: Right;    Social History   Social History  . Marital status: Single    Spouse name: N/A  . Number of children: N/A  . Years of education: N/A   Occupational History  . RN [employer]   Social History Main Topics  . Smoking status: Never Smoker  . Smokeless tobacco: Never Used  . Alcohol use Yes     Comment: occasionally  . Drug use: No  . Sexual activity: Not on file   Other Topics Concern  . Not on file   Social History Narrative  . No narrative on file    Current Outpatient Prescriptions on File Prior to Visit  Medication Sig Dispense Refill  . aspirin 81 MG tablet Take 81 mg by mouth daily.    Marland Kitchen atorvastatin (LIPITOR) 40 MG tablet Take 1 tablet (40 mg total) by mouth daily. 90 tablet 3  . azelastine (OPTIVAR) 0.05 % ophthalmic solution     . budesonide-formoterol (SYMBICORT) 160-4.5 MCG/ACT  inhaler Inhale 1 puff into the lungs daily. 1-2 puffs two times a day    . FLUoxetine (PROZAC) 20 MG capsule TAKE TWO CAPSULES BY MOUTH ONCE DAILY 180 capsule 3  . glucose blood (ONETOUCH VERIO) test strip 1 each by Other route daily. And lancets 1/day 250.00 100 each 12  . guanFACINE (TENEX) 1 MG tablet Take 1 tablet (1 mg total) by mouth daily. 90 tablet 3  . hydrOXYzine (ATARAX/VISTARIL) 10 MG tablet   0  . ibuprofen (ADVIL,MOTRIN) 800 MG tablet Take 800 mg by mouth every 8 (eight) hours as needed.    . NON FORMULARY Inject into the skin once a week. Allergy shots    . triamcinolone ointment (KENALOG) 0.1 %   0  . triamterene-hydrochlorothiazide (MAXZIDE-25) 37.5-25 MG tablet Take 1 tablet by mouth daily. 90 tablet 3   No current facility-administered medications on file prior to visit.     Allergies  Allergen Reactions  . Morphine Itching and Nausea And Vomiting  . Ramipril Other (See Comments)    ANGIOEDEMA  . Latex Rash    Used latex gloves with power, rash occured  . Mercurochrome [Merbromin] Rash    No family history on file.  BP 112/82   Pulse 75   Wt  194 lb 3.2 oz (88.1 kg)   SpO2 96%   BMI 32.32 kg/m    Review of Systems Denies fever, fatigue, visual loss, hearing loss, chest pain, sob, back pain, depression, cold intolerance, BRBPR, hematuria, syncope, numbness, allergy sxs, easy bruising, and open ulcer.     Objective:   Physical Exam VS: see vs page GEN: no distress HEAD: head: no deformity eyes: no periorbital swelling, no proptosis external nose and ears are normal mouth: no lesion seen NECK: supple, thyroid is not enlarged CHEST WALL: no deformity LUNGS:  Clear to auscultation BREASTS: sees gyn CV: reg rate and rhythm, no murmur ABD: abdomen is soft, nontender.  no hepatosplenomegaly.  not distended.  no hernia GENITALIA/RECTAL: sees gyn MUSCULOSKELETAL: muscle bulk and strength are grossly normal.  no obvious joint swelling.  gait is normal and  steady EXTEMITIES: no deformity.  no ulcer on the feet.  feet are of normal color and temp.  no edema PULSES: dorsalis pedis intact bilat.  no carotid bruit NEURO:  cn 2-12 grossly intact.   readily moves all 4's.  sensation is intact to touch on the feet SKIN:  Normal texture and temperature.  Diffuse patchy psoriasis.   NODES:  None palpable at the neck PSYCH: alert, well-oriented.  Does not appear anxious nor depressed.  I personally reviewed electrocardiogram tracing (today): Indication: DM Impression: NSR.  Ant QS complexes  No hypertrophy. Compared to 2016: no significant change     Assessment & Plan:  Wellness visit today, with problems stable, except as noted.  Patient Instructions  blood tests are requested for you today.  We'll let you know about the results. I have sent a prescription to your pharmacy, to change metformin to [email or web address] Please consider these measures for your health:  minimize alcohol.  Do not use tobacco products.  Have a colonoscopy at least every 10 years from age 9.  Women should have an annual mammogram from age 59.  Keep firearms safely stored.  Always use seat belts.  have working smoke alarms in your home.  See an eye doctor and dentist regularly.  Never drive under the influence of alcohol or drugs (including prescription drugs).  . Please come back for a follow-up appointment in 6 months.

## 2017-08-08 NOTE — Telephone Encounter (Signed)
[facility] called in reference to orders needing to be changed to [id number]. Please call [facility] and advise.

## 2017-08-08 NOTE — Patient Instructions (Addendum)
blood tests are requested for you today.  We'll let you know about the results. I have sent a prescription to your pharmacy, to change metformin to jentadueto Please consider these measures for your health:  minimize alcohol.  Do not use tobacco products.  Have a colonoscopy at least every 10 years from age 61.  Women should have an annual mammogram from age 39.  Keep firearms safely stored.  Always use seat belts.  have working smoke alarms in your home.  See an eye doctor and dentist regularly.  Never drive under the influence of alcohol or drugs (including prescription drugs).  . Please come back for a follow-up appointment in 6 months.

## 2017-08-09 ENCOUNTER — Encounter: Payer: Self-pay | Admitting: Endocrinology

## 2017-08-09 LAB — HEPATITIS C ANTIBODY
Hepatitis C Ab: NONREACTIVE
SIGNAL TO CUT-OFF: 0.06 (ref <1.00)

## 2017-08-09 LAB — HIV ANTIBODY (ROUTINE TESTING W REFLEX): HIV 1&2 Ab, 4th Generation: NONREACTIVE

## 2017-08-11 NOTE — Telephone Encounter (Signed)
done

## 2017-08-22 ENCOUNTER — Ambulatory Visit
Admission: RE | Admit: 2017-08-22 | Discharge: 2017-08-22 | Disposition: A | Discharge status: Home / Self Care | Payer: BLUE CROSS/BLUE SHIELD | Source: Ambulatory Visit | Attending: Endocrinology | Admitting: Endocrinology

## 2017-08-22 DIAGNOSIS — E042 Nontoxic multinodular goiter: Secondary | ICD-10-CM

## 2017-09-05 ENCOUNTER — Other Ambulatory Visit: Payer: Self-pay | Admitting: Endocrinology

## 2017-09-20 ENCOUNTER — Other Ambulatory Visit: Payer: Self-pay | Admitting: Endocrinology

## 2017-10-10 ENCOUNTER — Other Ambulatory Visit: Payer: Self-pay | Admitting: Endocrinology

## 2017-12-01 LAB — HM DIABETES EYE EXAM

## 2017-12-17 ENCOUNTER — Other Ambulatory Visit: Payer: Self-pay | Admitting: Endocrinology

## 2017-12-19 ENCOUNTER — Other Ambulatory Visit: Payer: Self-pay

## 2017-12-25 ENCOUNTER — Other Ambulatory Visit: Payer: Self-pay | Admitting: Obstetrics and Gynecology

## 2017-12-29 ENCOUNTER — Other Ambulatory Visit: Payer: Self-pay | Admitting: Obstetrics and Gynecology

## 2017-12-29 DIAGNOSIS — D219 Benign neoplasm of connective and other soft tissue, unspecified: Secondary | ICD-10-CM

## 2018-01-12 ENCOUNTER — Ambulatory Visit
Admission: RE | Admit: 2018-01-12 | Discharge: 2018-01-12 | Disposition: A | Discharge status: Home / Self Care | Payer: BLUE CROSS/BLUE SHIELD | Source: Ambulatory Visit | Attending: Obstetrics and Gynecology | Admitting: Obstetrics and Gynecology

## 2018-01-12 DIAGNOSIS — D219 Benign neoplasm of connective and other soft tissue, unspecified: Secondary | ICD-10-CM

## 2018-02-06 ENCOUNTER — Ambulatory Visit: Payer: Managed Care, Other (non HMO) | Admitting: Endocrinology

## 2018-02-06 ENCOUNTER — Encounter: Payer: Self-pay | Admitting: Endocrinology

## 2018-02-06 VITALS — BP 106/62 | HR 86 | Wt 190.2 lb

## 2018-02-06 DIAGNOSIS — E119 Type 2 diabetes mellitus without complications: Secondary | ICD-10-CM | POA: Diagnosis not present

## 2018-02-06 LAB — POCT GLYCOSYLATED HEMOGLOBIN (HGB A1C): Hemoglobin A1C: 6

## 2018-02-06 MED ORDER — GUANFACINE HCL 1 MG PO TABS: 0.5000 mg | ORAL_TABLET | Freq: Every day | ORAL | 3 refills | Status: DC

## 2018-02-06 NOTE — Patient Instructions (Addendum)
Please continue the same janumet Please reduce the guanfacine to 1/2 pill per day Please come back for a follow-up appointment in 6 months (after 08/08/18).

## 2018-02-06 NOTE — Progress Notes (Signed)
Subjective:    Patient ID: Brittany Taylor, female    DOB: 1956-01-01, 62 y.o.   MRN: [id number]  HPI Pt returns for f/u of diabetes mellitus: DM type: 2 Dx'ed: 1950 Complications: none Therapy: 2 oral meds GDM: never DKA: never Severe hypoglycemia: never Pancreatitis: never Pancreatic imaging: never Other: she has never taken insulin.   Interval history: she takes janumet as rx'ed.  She says cbg's are well-controlled.  pt states she feels well in general.   Past Medical History:  Diagnosis Date  . Allergy-induced asthma    daily inhaler  . Dental crown present   . Depression   . High cholesterol   . Hypertension    states under control with med., has been on med. x 10 yr.  . Non-insulin dependent type 2 diabetes mellitus ([place])   . Psoriasis    arms, bilateral leg, back, breast, neck, ears  . Stenosing tenosynovitis of finger of right hand 03/2015   right ring finger    Past Surgical History:  Procedure Laterality Date  . LEEP  03/18/2003  . POLYPECTOMY  01/2015   uterine  . TRIGGER FINGER RELEASE Left 06/15/2009   thumb  . TRIGGER FINGER RELEASE Right 11/15/2008   thumb  . TRIGGER FINGER RELEASE Right 04/13/2015   Procedure: RELEASE A-1 PULLEY RIGHT RING FINGER;  Surgeon: Daryll Brod, MD;  Location: [facility];  Service: Orthopedics;  Laterality: Right;    Social History   Socioeconomic History  . Marital status: Single    Spouse name: Not on file  . Number of children: Not on file  . Years of education: Not on file  . Highest education level: Not on file  Occupational History  . Occupation: Programmer, multimedia: [employer]  Social Needs  . Financial resource strain: Not on file  . Food insecurity:    Worry: Not on file    Inability: Not on file  . Transportation needs:    Medical: Not on file    Non-medical: Not on file  Tobacco Use  . Smoking status: Never Smoker  . Smokeless tobacco: Never Used  Substance and Sexual Activity  .  Alcohol use: Yes    Comment: occasionally  . Drug use: No  . Sexual activity: Not on file  Lifestyle  . Physical activity:    Days per week: Not on file    Minutes per session: Not on file  . Stress: Not on file  Relationships  . Social connections:    Talks on phone: Not on file    Gets together: Not on file    Attends religious service: Not on file    Active member of club or organization: Not on file    Attends meetings of clubs or organizations: Not on file    Relationship status: Not on file  . Intimate partner violence:    Fear of current or ex partner: Not on file    Emotionally abused: Not on file    Physically abused: Not on file    Forced sexual activity: Not on file  Other Topics Concern  . Not on file  Social History Narrative  . Not on file    Current Outpatient Medications on File Prior to Visit  Medication Sig Dispense Refill  . aspirin 81 MG tablet Take 81 mg by mouth daily.    Marland Kitchen atorvastatin (LIPITOR) 40 MG tablet TAKE 1 TABLET (40 MG TOTAL) BY MOUTH DAILY. 90 tablet 2  .  azelastine (OPTIVAR) 0.05 % ophthalmic solution     . budesonide-formoterol (SYMBICORT) 160-4.5 MCG/ACT inhaler Inhale 1 puff into the lungs daily. 1-2 puffs two times a day    . conjugated estrogens (PREMARIN) vaginal cream Place 0.625 mg vaginally 2 (two) times a week.    Marland Kitchen FLUoxetine (PROZAC) 20 MG capsule TAKE 2 CAPSULES BY MOUTH ONCE DAILY 180 capsule 2  . glucose blood (ONETOUCH VERIO) test strip 1 each by Other route daily. And lancets 1/day 250.00 100 each 12  . hydrOXYzine (ATARAX/VISTARIL) 10 MG tablet   0  . ibuprofen (ADVIL,MOTRIN) 800 MG tablet Take 800 mg by mouth every 8 (eight) hours as needed.    . NON FORMULARY Inject into the skin once a week. Allergy shots    . SitaGLIPtin-MetFORMIN HCl (JANUMET XR) 50-1000 MG TB24 Take 2 tablets by mouth every morning. 180 tablet 3  . triamcinolone ointment (KENALOG) 0.1 %   0  . triamterene-hydrochlorothiazide (MAXZIDE-25) 37.5-25 MG  tablet TAKE 1 TABLET BY MOUTH DAILY. 90 tablet 2   No current facility-administered medications on file prior to visit.     Allergies  Allergen Reactions  . Morphine Itching and Nausea And Vomiting  . Ramipril Other (See Comments)    ANGIOEDEMA  . Latex Rash    Used latex gloves with power, rash occured  . Mercurochrome [Merbromin] Rash    No family history on file.  BP 106/62 (BP Location: Left Arm, Patient Position: Sitting, Cuff Size: Normal)   Pulse 86   Wt 190 lb 3.2 oz (86.3 kg)   SpO2 96%   BMI 31.65 kg/m    Review of Systems She denies hypoglycemia.      Objective:   Physical Exam VITAL SIGNS:  See vs page.   GENERAL: no distress.  Pulses: dorsalis pedis intact bilat.   MSK: no deformity of the feet.  CV: trace bilat leg edema.   Skin:  no ulcer on the feet.  normal color and temp on the feet, but there is severe hyperpigmentation of the legs (psoriasis).   Neuro: sensation is intact to touch on the feet.     Lab Results  Component Value Date   HGBA1C 6.0 02/06/2018   Lab Results  Component Value Date   CREATININE 0.83 08/08/2017   BUN 21 08/08/2017   NA 136 08/08/2017   K 3.9 08/08/2017   CL 99 08/08/2017   CO2 30 08/08/2017       Assessment & Plan:  Type 2 DM: well-controlled.  HTN: overcontrolled.   Patient Instructions  Please continue the same janumet Please reduce the guanfacine to 1/2 pill per day Please come back for a follow-up appointment in 6 months (after 08/08/18).

## 2018-03-02 ENCOUNTER — Other Ambulatory Visit: Payer: Self-pay | Admitting: Obstetrics and Gynecology

## 2018-03-10 NOTE — Patient Instructions (Addendum)
Your procedure is scheduled on: Monday Mar 23, 2018 at 9:15 am  Enter through the Main Entrance of [facility] at: 7:45 am  Pick up the phone at the desk and dial [phone].  Call this number if you have problems the morning of surgery: [phone].  Remember: Do NOT eat food or Do Not Drink any liquids after: Midnight on Sunday May 5  Take these medicines the morning of surgery with a SIP OF WATER:  Claritin. Hydroxyzine, Prozac, Estradiol, Kenalog cream   Ok to use symbicort.  Azelastine eye drops, if needed  hold Metformin for 24 hours prior to surgery  Bring albuterol inhaler with you on day of surgery.  STOP ALL VITAMINS AND SUPPLEMENTS 1 WEEK PRIOR TO SURGERY  DO NOT SMOKE DAY OF SURGERY  Do NOT wear jewelry (body piercing), metal hair clips/bobby pins, make-up, or nail polish. Do NOT wear lotions, powders, or perfumes.  You may wear deoderant. Do NOT shave for 48 hours prior to surgery. Do NOT bring valuables to the hospital. Contacts, dentures, or bridgework may not be worn into surgery.  Leave suitcase in car.  After surgery it may be brought to your room.    For patients admitted to the hospital, checkout time is 11:00 AM the day of discharge.

## 2018-03-11 ENCOUNTER — Encounter (HOSPITAL_COMMUNITY)
Admission: RE | Admit: 2018-03-11 | Discharge: 2018-03-11 | Disposition: A | Discharge status: Home / Self Care | Payer: BLUE CROSS/BLUE SHIELD | Source: Ambulatory Visit | Attending: Obstetrics and Gynecology | Admitting: Obstetrics and Gynecology

## 2018-03-11 ENCOUNTER — Encounter (HOSPITAL_COMMUNITY): Payer: Self-pay

## 2018-03-11 ENCOUNTER — Other Ambulatory Visit: Payer: Self-pay

## 2018-03-11 DIAGNOSIS — Z01812 Encounter for preprocedural laboratory examination: Secondary | ICD-10-CM | POA: Diagnosis present

## 2018-03-11 HISTORY — DX: Gastro-esophageal reflux disease without esophagitis: K21.9

## 2018-03-11 HISTORY — DX: Unspecified osteoarthritis, unspecified site: M19.90

## 2018-03-11 HISTORY — DX: Nausea with vomiting, unspecified: R11.2

## 2018-03-11 HISTORY — DX: Other specified postprocedural states: Z98.890

## 2018-03-11 HISTORY — DX: Anxiety disorder, unspecified: F41.9

## 2018-03-11 LAB — ABO/RH: ABO/RH(D): O POS

## 2018-03-11 LAB — CBC
HEMATOCRIT: 38.2 % (ref 36.0–46.0)
Hemoglobin: 12.7 g/dL (ref 12.0–15.0)
MCH: 29.9 pg (ref 26.0–34.0)
MCHC: 33.2 g/dL (ref 30.0–36.0)
MCV: 89.9 fL (ref 78.0–100.0)
Platelets: 296 10*3/uL (ref 150–400)
RBC: 4.25 MIL/uL (ref 3.87–5.11)
RDW: 15.1 % (ref 11.5–15.5)
WBC: 3 10*3/uL — AB (ref 4.0–10.5)

## 2018-03-11 LAB — BASIC METABOLIC PANEL
Anion gap: 11 (ref 5–15)
BUN: 14 mg/dL (ref 6–20)
CHLORIDE: 107 mmol/L (ref 101–111)
CO2: 22 mmol/L (ref 22–32)
Calcium: 9.3 mg/dL (ref 8.9–10.3)
Creatinine, Ser: 0.82 mg/dL (ref 0.44–1.00)
GFR calc Af Amer: >60 mL/min (ref >60)
GFR calc non Af Amer: >60 mL/min (ref >60)
GLUCOSE: 94 mg/dL (ref 65–99)
POTASSIUM: 4.4 mmol/L (ref 3.5–5.1)
Sodium: 140 mmol/L (ref 135–145)

## 2018-03-11 LAB — TYPE AND SCREEN
ABO/RH(D): O POS
Antibody Screen: NEGATIVE

## 2018-03-11 NOTE — Pre-Procedure Instructions (Signed)
Patient donating her own blood for day of surgery

## 2018-03-11 NOTE — Pre-Procedure Instructions (Signed)
Dr. Gifford Shave viewed and okay' d EKG, aware of history of hypertension and diabetes

## 2018-03-18 NOTE — H&P (Signed)
Brittany Taylor is a 62 y.o.  female P: 0-0-1-0 who presents for a supra-cervical hysterectomy because of rapidly growing uterine fibroids.  In  1995, the patient underwent an abdominal myomectomy for symptomatic fibroids and was asymptomatic until 2016 when she developed post menopausal bleeding. Subsequently she had  a hysteroscopy with dilatation and curettage that returned benign findings.    Also in  2016,  the patient's uterus,  on exam measured 20 weeks in size however,  her most recent exam showed 24 weeks size.  A pelvic ultrasound revealed: an anteverted uterus: 19.3 cm from fundus to external os with dimensions of:  19.31 x 11.35 x 16.54 cm ,  an endometrium with a complex cystic filled appearance-3.58 cm; #6 uterine fibroids: 7.76 x 5.40 x 9.76 cm-pedunculated right lateral fundal;    8.76 x 8.81 x 9.21 cm-right superior lateral intramural;   5.39 x 5.12 x 5.16 cm-right inferior lateral intramural;    6.38 x 5.84 x 6.46 cm-left mid-anterior intramural;  3.84 .x 2.39 x 5.39 cm-anterior superior intramural and  4.62 x 4.03 x 3.55 cm-right superior sub-serosal fibroid; neither ovary was visualized.  An MRI done at the same time showed:  a markedly enlarged fibroid uterus with  a volume of 1950 cc containing multiple enhancing fibroids of varying size. Hydrometra secondary to mass effect of a  large lower uterine segment fibroid;  a small enhancing nodule within the endometrial cavity anteriorly-1.5 cm that may  represent a sub-mucosal fibroid of polyp; large parametrial varix that may indicate pelvic congestion syndrome but no ascites, fluid collections or adenopathy.  The patient denies any changes in bowel function,  occasionally will leak urine with cough or sneeze and has some discomfort with intercourse that is primarily related to atrophy.   Given the  rapid increase in uterine size in a postmenopausal patient it has been recommended that she undergo removal of her uterus and the patient has consented  to the same.  Past Medical History  OB History: G:1;  P: 0-0-1-0  GYN History: menarche: 63 YO    The patient reports a past history of: herpes.  Has a  history of abnormal PAP smear in 2004 treated with a LEEP and have been normal since.  Last PAP smear: January 2019-normal  Medical History: Hypertension, Hyperlipidemia, Psoriasis, Goiter, Leukopenia, Depression, Asthma, Diabetes Mellitus and Seasonal Allergies  Surgical History: 1996 Abdominal Myomectomy;  2000 Breast Reduction;  2004 Loop Electrosurgical Excision Procedure; 2009 Right Thumb Trigger Finger Release; 2010 Left Thumb Trigger Finger Release; 2016 Right Ring Trigger Finger Release and 2016 Hysteroscopy, Dilatation and Curettage. Denies problems with anesthesia or history of blood transfusions  Family History: Diabetes Mellitus, Vascular Dementia, Hypertension, Stroke, Lung Cancer, Glaucoma and Prostate Cancer  Social History: Single and employed as an Therapist, sports;  Rarely consumes alcohol and does not use tobacco   Medications: Azelastine 0.05% eye drops Epi-Pen 0.3mg /0.3 mL  Auto Injector prn Fluocinonide 0.05% Topical Solution prn Fluoxetine 20 mg  daily Fluticasone Propionate 50 mcg/actuation nasal spray prn Guafacine 1 mg  prn Hydroxyzine 10 mg prn Ibuprofen 600 mg every 6 hours,  pc prn Janumet XR 50 mg-1000 mg daily Aspirin 81 mg daily (stopped 03/12/18) Montelukast 10 mg daily Symbicort 160 mcg-4.5 mcg HFA Aerosol Inhaler as directed Triamcinolone Acetonide 0.1%Topical Oinment Ventolin HFA  90 mcg/actuation  prn Triamterene 37.5-HCTA 25 mg  daily Weekly Allergy Injections   Allergies  Allergen Reactions  . Morphine Itching and Nausea And Vomiting  . Ramipril  Other (See Comments)    ANGIOEDEMA  . Mold Extract [Trichophyton] Itching and Cough    Throat itches  . Latex Rash    Used latex gloves with powder, rash occured  . Mercurochrome [Merbromin] Rash  Patient is able to take Vicodin and Percocet without  difficulty   Denies sensitivity to peanuts, shellfish, soy,  or adhesives.   ROS: Admits to glasses/contact lenses, right knee swelling, psoriatic rash, rare leaking of urine with cough,  but denies headache, vision changes, nasal congestion, dysphagia, tinnitus, dizziness, hoarseness, cough,  chest pain, shortness of breath, nausea, vomiting, diarrhea,constipation,  urinary frequency, urgency  dysuria, hematuria, vaginitis symptoms, pelvic pain, easy bruising,  myalgias, arthralgias, skin rashes, unexplained weight loss and except as is mentioned in the history of present illness, patient's review of systems is otherwise negative.     Physical Exam  Bp: 100/58   P: 98 bpm  Weight: 191 lbs.  Height: 5' 3.5 "  BMI: 33.3  Neck: supple without masses or thyromegaly Lungs: clear to auscultation Heart: regular rate and rhythm Abdomen: firm mass from pelvis to level of umbilicus-nontender and no organomegaly Pelvic:EGBUS- wnl; vagina-normal rugae; uterus-22-24 weeks  size, cervix without lesions or motion tenderness; adnexae-limited exam due to mass effect of uterus. Extremities:  no clubbing, cyanosis or edema   Assesment: Rapidly Enlarging Uterine Fibroids   Disposition:  A discussion was held with patient regarding the indication for her procedure(s) along with the risks, which include but are not limited to:  reaction to anesthesia, damage to adjacent organs, infection, excessive bleeding, remote possibility of regrowth of fibroids on cervical stump and bleeding from cervix. The patient verbalized understanding of these risks and has consented to proceed with an Abdominal Supra-cervical Hysterectomy and Bilateral Salpingectomy at [place] on Mar 23, 2018 at 9:15 a.m.  CSN# [id number]   Brach Birdsall J. Florene Glen, PA-C  for Dr. Dede Query. Rivard

## 2018-03-23 ENCOUNTER — Inpatient Hospital Stay (HOSPITAL_COMMUNITY)
Admission: AD | Admit: 2018-03-23 | Discharge: 2018-03-25 | DRG: 743 | Disposition: A | Discharge status: Home / Self Care | Payer: BLUE CROSS/BLUE SHIELD | Source: Ambulatory Visit | Attending: Obstetrics and Gynecology | Admitting: Obstetrics and Gynecology

## 2018-03-23 ENCOUNTER — Inpatient Hospital Stay (HOSPITAL_COMMUNITY): Payer: BLUE CROSS/BLUE SHIELD | Admitting: Certified Registered Nurse Anesthetist

## 2018-03-23 ENCOUNTER — Encounter (HOSPITAL_COMMUNITY)
Admission: AD | Disposition: A | Discharge status: Home / Self Care | Payer: Self-pay | Source: Ambulatory Visit | Attending: Obstetrics and Gynecology

## 2018-03-23 ENCOUNTER — Encounter (HOSPITAL_COMMUNITY): Payer: Self-pay

## 2018-03-23 ENCOUNTER — Other Ambulatory Visit: Payer: Self-pay

## 2018-03-23 DIAGNOSIS — D259 Leiomyoma of uterus, unspecified: Secondary | ICD-10-CM | POA: Diagnosis present

## 2018-03-23 HISTORY — PX: CYSTOSCOPY: SHX5120

## 2018-03-23 HISTORY — PX: SUPRACERVICAL ABDOMINAL HYSTERECTOMY: SHX5393

## 2018-03-23 LAB — GLUCOSE, CAPILLARY
Glucose-Capillary: 98 mg/dL (ref 65–99)
Glucose-Capillary: 187 mg/dL — ABNORMAL HIGH (ref 65–99)

## 2018-03-23 SURGERY — HYSTERECTOMY, SUPRACERVICAL, ABDOMINAL
Anesthesia: General | Site: Urethra

## 2018-03-23 MED ORDER — DIPHENHYDRAMINE HCL 12.5 MG/5ML PO ELIX
12.5000 mg | ORAL_SOLUTION | Freq: Four times a day (QID) | ORAL | Status: DC | PRN
  Administered 2018-03-24 (×2): 12.5 mg via ORAL
  Filled 2018-03-23 (×2): qty 5

## 2018-03-23 MED ORDER — ONDANSETRON HCL 4 MG/2ML IJ SOLN
INTRAMUSCULAR | Status: DC | PRN
  Administered 2018-03-23: 4 mg via INTRAVENOUS

## 2018-03-23 MED ORDER — ONDANSETRON HCL 4 MG/2ML IJ SOLN
INTRAMUSCULAR | Status: AC
  Filled 2018-03-23: qty 2

## 2018-03-23 MED ORDER — IBUPROFEN 600 MG PO TABS: 600.0000 mg | ORAL_TABLET | Freq: Four times a day (QID) | ORAL | Status: DC | PRN

## 2018-03-23 MED ORDER — PROPOFOL 10 MG/ML IV BOLUS
INTRAVENOUS | Status: AC
  Filled 2018-03-23: qty 20

## 2018-03-23 MED ORDER — ONDANSETRON HCL 4 MG/2ML IJ SOLN: 4.0000 mg | Freq: Four times a day (QID) | INTRAMUSCULAR | Status: DC | PRN

## 2018-03-23 MED ORDER — OXYCODONE HCL 5 MG PO TABS: 5.0000 mg | ORAL_TABLET | Freq: Once | ORAL | Status: DC | PRN

## 2018-03-23 MED ORDER — LIDOCAINE HCL (CARDIAC) PF 100 MG/5ML IV SOSY
PREFILLED_SYRINGE | INTRAVENOUS | Status: DC | PRN
  Administered 2018-03-23: 50 mg via INTRAVENOUS

## 2018-03-23 MED ORDER — BUPIVACAINE HCL (PF) 0.25 % IJ SOLN
INTRAMUSCULAR | Status: AC
  Filled 2018-03-23: qty 30

## 2018-03-23 MED ORDER — KETOROLAC TROMETHAMINE 30 MG/ML IJ SOLN
INTRAMUSCULAR | Status: DC | PRN
  Administered 2018-03-23: 30 mg via INTRAVENOUS

## 2018-03-23 MED ORDER — OXYCODONE-ACETAMINOPHEN 5-325 MG PO TABS
1.0000 | ORAL_TABLET | Freq: Four times a day (QID) | ORAL | Status: DC | PRN
  Administered 2018-03-24 – 2018-03-25 (×4): 1 via ORAL
  Filled 2018-03-23 (×4): qty 1

## 2018-03-23 MED ORDER — MOMETASONE FURO-FORMOTEROL FUM 200-5 MCG/ACT IN AERO
2.0000 | INHALATION_SPRAY | Freq: Two times a day (BID) | RESPIRATORY_TRACT | Status: DC
Start: 2018-03-23 — End: 2018-03-25
  Administered 2018-03-23 – 2018-03-24 (×3): 2 via RESPIRATORY_TRACT
  Filled 2018-03-23: qty 8.8

## 2018-03-23 MED ORDER — FENTANYL CITRATE (PF) 100 MCG/2ML IJ SOLN
25.0000 ug | INTRAMUSCULAR | Status: DC | PRN
  Administered 2018-03-23: 50 ug via INTRAVENOUS

## 2018-03-23 MED ORDER — LACTATED RINGERS IV SOLN
INTRAVENOUS | Status: DC
  Administered 2018-03-23 (×2): via INTRAVENOUS
  Administered 2018-03-23: 125 mL/h via INTRAVENOUS

## 2018-03-23 MED ORDER — SCOPOLAMINE 1 MG/3DAYS TD PT72
MEDICATED_PATCH | TRANSDERMAL | Status: AC
  Filled 2018-03-23: qty 1

## 2018-03-23 MED ORDER — FENTANYL CITRATE (PF) 250 MCG/5ML IJ SOLN
INTRAMUSCULAR | Status: AC
  Filled 2018-03-23: qty 5

## 2018-03-23 MED ORDER — DIPHENHYDRAMINE HCL 50 MG/ML IJ SOLN: 12.5000 mg | Freq: Four times a day (QID) | INTRAMUSCULAR | Status: DC | PRN

## 2018-03-23 MED ORDER — SCOPOLAMINE 1 MG/3DAYS TD PT72: 1.0000 | MEDICATED_PATCH | Freq: Once | TRANSDERMAL | Status: DC

## 2018-03-23 MED ORDER — MIDAZOLAM HCL 2 MG/2ML IJ SOLN
INTRAMUSCULAR | Status: DC | PRN
  Administered 2018-03-23: 2 mg via INTRAVENOUS

## 2018-03-23 MED ORDER — NALOXONE HCL 0.4 MG/ML IJ SOLN: 0.4000 mg | INTRAMUSCULAR | Status: DC | PRN

## 2018-03-23 MED ORDER — LACTATED RINGERS IV SOLN
INTRAVENOUS | Status: DC
  Administered 2018-03-23 – 2018-03-24 (×2): via INTRAVENOUS

## 2018-03-23 MED ORDER — FENTANYL CITRATE (PF) 100 MCG/2ML IJ SOLN
INTRAMUSCULAR | Status: AC
  Filled 2018-03-23: qty 2

## 2018-03-23 MED ORDER — FENTANYL CITRATE (PF) 100 MCG/2ML IJ SOLN
INTRAMUSCULAR | Status: DC | PRN
  Administered 2018-03-23 (×5): 50 ug via INTRAVENOUS

## 2018-03-23 MED ORDER — OXYCODONE HCL 5 MG/5ML PO SOLN: 5.0000 mg | Freq: Once | ORAL | Status: DC | PRN

## 2018-03-23 MED ORDER — ONDANSETRON HCL 4 MG/2ML IJ SOLN: 4.0000 mg | Freq: Four times a day (QID) | INTRAMUSCULAR | Status: DC

## 2018-03-23 MED ORDER — PHENYLEPHRINE 40 MCG/ML (10ML) SYRINGE FOR IV PUSH (FOR BLOOD PRESSURE SUPPORT)
PREFILLED_SYRINGE | INTRAVENOUS | Status: AC
  Filled 2018-03-23: qty 10

## 2018-03-23 MED ORDER — TRIAMTERENE-HCTZ 37.5-25 MG PO TABS
1.0000 | ORAL_TABLET | Freq: Every day | ORAL | Status: DC
  Administered 2018-03-23 – 2018-03-25 (×3): 1 via ORAL
  Filled 2018-03-23 (×4): qty 1

## 2018-03-23 MED ORDER — METFORMIN HCL ER 750 MG PO TB24
2000.0000 mg | ORAL_TABLET | Freq: Every day | ORAL | Status: DC
  Administered 2018-03-24 – 2018-03-25 (×2): 2000 mg via ORAL
  Filled 2018-03-23 (×3): qty 1

## 2018-03-23 MED ORDER — FLUOXETINE HCL 20 MG PO CAPS
40.0000 mg | ORAL_CAPSULE | Freq: Every day | ORAL | Status: DC
  Administered 2018-03-24 – 2018-03-25 (×2): 40 mg via ORAL
  Filled 2018-03-23 (×3): qty 2

## 2018-03-23 MED ORDER — CEFOTETAN DISODIUM-DEXTROSE 2-2.08 GM-%(50ML) IV SOLR
2.0000 g | INTRAVENOUS | Status: AC
  Administered 2018-03-23: 2 g via INTRAVENOUS

## 2018-03-23 MED ORDER — ALBUTEROL SULFATE HFA 108 (90 BASE) MCG/ACT IN AERS: 1.0000 | INHALATION_SPRAY | Freq: Four times a day (QID) | RESPIRATORY_TRACT | Status: DC | PRN

## 2018-03-23 MED ORDER — KETOROLAC TROMETHAMINE 30 MG/ML IJ SOLN
INTRAMUSCULAR | Status: AC
  Filled 2018-03-23: qty 1

## 2018-03-23 MED ORDER — BUPIVACAINE HCL (PF) 0.25 % IJ SOLN
INTRAMUSCULAR | Status: DC | PRN
  Administered 2018-03-23: 20 mL

## 2018-03-23 MED ORDER — MENTHOL 3 MG MT LOZG: 1.0000 | LOZENGE | OROMUCOSAL | Status: DC | PRN

## 2018-03-23 MED ORDER — SUGAMMADEX SODIUM 200 MG/2ML IV SOLN
INTRAVENOUS | Status: AC
  Filled 2018-03-23: qty 2

## 2018-03-23 MED ORDER — SUGAMMADEX SODIUM 200 MG/2ML IV SOLN
INTRAVENOUS | Status: DC | PRN
  Administered 2018-03-23: 100 mg via INTRAVENOUS

## 2018-03-23 MED ORDER — SODIUM CHLORIDE 0.9% FLUSH: 9.0000 mL | INTRAVENOUS | Status: DC | PRN

## 2018-03-23 MED ORDER — ROCURONIUM BROMIDE 100 MG/10ML IV SOLN
INTRAVENOUS | Status: DC | PRN
  Administered 2018-03-23: 40 mg via INTRAVENOUS
  Administered 2018-03-23: 10 mg via INTRAVENOUS

## 2018-03-23 MED ORDER — DOCUSATE SODIUM 100 MG PO CAPS
100.0000 mg | ORAL_CAPSULE | Freq: Two times a day (BID) | ORAL | Status: DC
  Administered 2018-03-23 – 2018-03-25 (×4): 100 mg via ORAL
  Filled 2018-03-23 (×4): qty 1

## 2018-03-23 MED ORDER — IBUPROFEN 600 MG PO TABS
600.0000 mg | ORAL_TABLET | Freq: Four times a day (QID) | ORAL | Status: DC
  Administered 2018-03-24 – 2018-03-25 (×4): 600 mg via ORAL
  Filled 2018-03-23 (×4): qty 1

## 2018-03-23 MED ORDER — LINAGLIPTIN 5 MG PO TABS
5.0000 mg | ORAL_TABLET | Freq: Every day | ORAL | Status: DC
  Administered 2018-03-24 – 2018-03-25 (×2): 5 mg via ORAL
  Filled 2018-03-23 (×3): qty 1

## 2018-03-23 MED ORDER — SITAGLIP PHOS-METFORMIN HCL ER 50-1000 MG PO TB24: 2.0000 | ORAL_TABLET | ORAL | Status: DC

## 2018-03-23 MED ORDER — LIDOCAINE HCL (PF) 1 % IJ SOLN
INTRAMUSCULAR | Status: AC
  Filled 2018-03-23: qty 5

## 2018-03-23 MED ORDER — HYDROMORPHONE 1 MG/ML IV SOLN
INTRAVENOUS | Status: DC
  Administered 2018-03-23: 1.8 mg via INTRAVENOUS
  Administered 2018-03-23: 14:00:00 via INTRAVENOUS
  Administered 2018-03-24: 0.6 mg via INTRAVENOUS
  Administered 2018-03-24: 1.2 mg via INTRAVENOUS
  Filled 2018-03-23: qty 25

## 2018-03-23 MED ORDER — KETOROLAC TROMETHAMINE 30 MG/ML IJ SOLN
30.0000 mg | Freq: Four times a day (QID) | INTRAMUSCULAR | Status: AC
  Administered 2018-03-23 – 2018-03-24 (×4): 30 mg via INTRAVENOUS
  Filled 2018-03-23 (×4): qty 1

## 2018-03-23 MED ORDER — PROPOFOL 10 MG/ML IV BOLUS
INTRAVENOUS | Status: DC | PRN
  Administered 2018-03-23: 200 mg via INTRAVENOUS

## 2018-03-23 MED ORDER — DEXAMETHASONE SODIUM PHOSPHATE 4 MG/ML IJ SOLN
INTRAMUSCULAR | Status: AC
  Filled 2018-03-23: qty 1

## 2018-03-23 MED ORDER — MONTELUKAST SODIUM 10 MG PO TABS
10.0000 mg | ORAL_TABLET | Freq: Every day | ORAL | Status: DC
  Administered 2018-03-23 – 2018-03-24 (×2): 10 mg via ORAL
  Filled 2018-03-23 (×3): qty 1

## 2018-03-23 MED ORDER — MIDAZOLAM HCL 2 MG/2ML IJ SOLN
INTRAMUSCULAR | Status: AC
  Filled 2018-03-23: qty 2

## 2018-03-23 MED ORDER — PHENYLEPHRINE HCL 10 MG/ML IJ SOLN
INTRAMUSCULAR | Status: DC | PRN
  Administered 2018-03-23 (×2): .08 mg via INTRAVENOUS
  Administered 2018-03-23 (×2): .04 mg via INTRAVENOUS
  Administered 2018-03-23 (×2): .08 mg via INTRAVENOUS

## 2018-03-23 MED ORDER — CEFOTETAN DISODIUM-DEXTROSE 2-2.08 GM-%(50ML) IV SOLR
INTRAVENOUS | Status: AC
  Filled 2018-03-23: qty 50

## 2018-03-23 MED ORDER — DEXAMETHASONE SODIUM PHOSPHATE 10 MG/ML IJ SOLN
INTRAMUSCULAR | Status: DC | PRN
  Administered 2018-03-23: 4 mg via INTRAVENOUS

## 2018-03-23 SURGICAL SUPPLY — 45 items
APL SKNCLS STERI-STRIP NONHPOA (GAUZE/BANDAGES/DRESSINGS) ×2
BENZOIN TINCTURE PRP APPL 2/3 (GAUZE/BANDAGES/DRESSINGS) ×3 IMPLANT
CANISTER SUCT 3000ML PPV (MISCELLANEOUS) ×3 IMPLANT
CELLS DAT CNTRL 66122 CELL SVR (MISCELLANEOUS) IMPLANT
DECANTER SPIKE VIAL GLASS SM (MISCELLANEOUS) IMPLANT
DISSECTOR SPONGE CHERRY (GAUZE/BANDAGES/DRESSINGS) ×3 IMPLANT
DRAPE WARM FLUID 44X44 (DRAPE) ×3 IMPLANT
DRSG OPSITE POSTOP 4X10 (GAUZE/BANDAGES/DRESSINGS) ×3 IMPLANT
DURAPREP 26ML APPLICATOR (WOUND CARE) ×3 IMPLANT
GAUZE SPONGE 4X4 16PLY XRAY LF (GAUZE/BANDAGES/DRESSINGS) IMPLANT
GLOVE BIOGEL PI IND STRL 7.0 (GLOVE) ×6 IMPLANT
GLOVE BIOGEL PI INDICATOR 7.0 (GLOVE) ×3
GLOVE ECLIPSE 6.5 STRL STRAW (GLOVE) ×3 IMPLANT
GOWN STRL REUS W/TWL LRG LVL3 (GOWN DISPOSABLE) ×9 IMPLANT
NEEDLE HYPO 22GX1.5 SAFETY (NEEDLE) ×3 IMPLANT
NS IRRIG 1000ML POUR BTL (IV SOLUTION) ×3 IMPLANT
PACK ABDOMINAL GYN (CUSTOM PROCEDURE TRAY) ×3 IMPLANT
PAD OB MATERNITY 4.3X12.25 (PERSONAL CARE ITEMS) ×3 IMPLANT
PENCIL SMOKE EVAC W/HOLSTER (ELECTROSURGICAL) ×3 IMPLANT
PROTECTOR NERVE ULNAR (MISCELLANEOUS) ×3 IMPLANT
RETAINER VISCERAL (MISCELLANEOUS) ×1 IMPLANT
RETRACTOR WND ALEXIS 18 MED (MISCELLANEOUS) IMPLANT
RETRACTOR WND ALEXIS 25 LRG (MISCELLANEOUS) IMPLANT
RTRCTR C-SECT PINK 25CM LRG (MISCELLANEOUS) ×1 IMPLANT
RTRCTR WOUND ALEXIS 18CM MED (MISCELLANEOUS)
RTRCTR WOUND ALEXIS 25CM LRG (MISCELLANEOUS)
SPONGE LAP 18X18 X RAY DECT (DISPOSABLE) ×3 IMPLANT
STAPLER VISISTAT 35W (STAPLE) IMPLANT
STRIP CLOSURE SKIN 1/2X4 (GAUZE/BANDAGES/DRESSINGS) ×3 IMPLANT
SUT MNCRL AB 3-0 PS2 27 (SUTURE) ×3 IMPLANT
SUT PLAIN 3 0 CT 1 27 (SUTURE) IMPLANT
SUT PROLENE 3 0 SH DA (SUTURE) IMPLANT
SUT VIC AB 0 CT1 18XCR BRD8 (SUTURE) ×6 IMPLANT
SUT VIC AB 0 CT1 27 (SUTURE) ×12
SUT VIC AB 0 CT1 27XBRD ANBCTR (SUTURE) ×8 IMPLANT
SUT VIC AB 0 CT1 36 (SUTURE) ×2 IMPLANT
SUT VIC AB 0 CT1 8-18 (SUTURE) ×9
SUT VIC AB 2-0 SH 27 (SUTURE) ×3
SUT VIC AB 2-0 SH 27XBRD (SUTURE) IMPLANT
SUT VIC AB 3-0 SH 27 (SUTURE)
SUT VIC AB 3-0 SH 27X BRD (SUTURE) IMPLANT
SUT VICRYL 0 TIES 12 18 (SUTURE) ×3 IMPLANT
SYR CONTROL 10ML LL (SYRINGE) ×3 IMPLANT
TOWEL OR 17X24 6PK STRL BLUE (TOWEL DISPOSABLE) ×9 IMPLANT
TRAY FOLEY W/BAG SLVR 14FR (SET/KITS/TRAYS/PACK) ×3 IMPLANT

## 2018-03-23 NOTE — Anesthesia Procedure Notes (Addendum)
Procedure Name: Intubation Date/Time: 03/23/2018 10:00 AM Performed by: Bufford Spikes, CRNA Pre-anesthesia Checklist: Patient identified, Emergency Drugs available, Suction available and Patient being monitored Patient Re-evaluated:Patient Re-evaluated prior to induction Oxygen Delivery Method: Circle system utilized Preoxygenation: Pre-oxygenation with 100% oxygen Induction Type: IV induction Ventilation: Mask ventilation without difficulty Laryngoscope Size: Miller and 2 Grade View: Grade II Tube type: Oral Tube size: 7.0 mm Number of attempts: 1 Airway Equipment and Method: Stylet and Oral airway Placement Confirmation: ETT inserted through vocal cords under direct vision,  positive ETCO2 and breath sounds checked- equal and bilateral Secured at: 21 cm Tube secured with: Tape Dental Injury: Teeth and Oropharynx as per pre-operative assessment

## 2018-03-23 NOTE — Transfer of Care (Signed)
Immediate Anesthesia Transfer of Care Note  Patient: Brittany Taylor  Procedure(s) Performed: HYSTERECTOMY SUPRACERVICAL ABDOMINAL (Bilateral Abdomen) CYSTOSCOPY (N/A Urethra)  Patient Location: PACU  Anesthesia Type:General  Level of Consciousness: awake, alert  and oriented  Airway & Oxygen Therapy: Patient Spontanous Breathing and Patient connected to face mask oxygen  Post-op Assessment: Report given to RN and Post -op Vital signs reviewed and stable  Post vital signs: Reviewed and stable  Last Vitals:  Vitals Value Taken Time  BP 153/95 03/23/2018 12:25 PM  Temp    Pulse 83 03/23/2018 12:28 PM  Resp 19 03/23/2018 12:28 PM  SpO2 97 % 03/23/2018 12:28 PM  Vitals shown include unvalidated device data.  Last Pain:  Vitals:   03/23/18 0801  TempSrc: Oral  PainSc: 0-No pain      Patients Stated Pain Goal: 3 (64/40/34 7425)  Complications: No apparent anesthesia complications

## 2018-03-23 NOTE — Op Note (Signed)
Preoperative diagnosis: uterine fibroids  Postoperative diagnosis: Same  Anesthesia: Gen.  Anesthesiologist: Dr. Marcie Bal  Procedure: Supracervical  abdominal hysterectomy with bilateral salpingectomy and cystoscopy  Surgeon: Dr. Cletis Media  Asst.: Earnstine Regal PA-C  Estimated blood loss: 250 cc  Procedure:  After being informed of the planned procedure with possible complications including but not limited to bleeding, infection, injury to other organs, informed consent is obtained and patient is taken to or #7. She is placed in the dorsal decubitus position, prepped and draped in a sterile fashion. A Foley catheter is inserted in her bladder.  We infiltrate the supra-pubic area using 20 cc of Marcaine 0.25 and perform a Pfannenstiel incision which is brought down sharply to the fascia. The fascia is incised in a midline fashion. Linea alba is dissected and peritoneum is entered bluntly. An Public house manager is positioned easily and bowels are retracted with abdominal packings.  Observation: uterus is enlarged with multiple fibroids compatible with 24 week size. Both tubes and ovaries are normal. There are adhesions between the sigmoid colon and the left posterior aspect of the uterus.  The uterus is grasped with a Corkscrew handle and exteriorized.We proceed with sharp dissection of the adhesions freeing the sigmoid colon.. Each round ligament is then sutured with a transfix suture of 0 Vicryl and section with cautery. This gives [place] entry in the retroperitoneum.The mesosalpynx is isolated on Rogers clamp on both sides, sectioned and sutured with 0-Vicryl. The utero-ovarian ligaments are then isolated bilaterally on Rogers clamps, sectioned and sutured with transfixed suture of 0-Vicryl and a free tie of 0-Vicryl. The anterior broad ligament is entered sharply all the way across the lower uterine segment allowing [place] to sharply and bluntly dissect bladder below the cervix. Vascular pedicles are  then skeletonized and isolated on Rogers forceps. The vascular pedicle is then sectioned and sutured with a transfix suture of 0 Vicryl. The uterine body is then sectioned off, leaving a very small cervix in place. The cervical stump is sutured with a running locked suture of 0-Vicryl.  We irrigated profusely with warm saline and complete hemostasis with a running locked suture of 2-0 Vicryl on the right peritoneal edges and cauterization on the anterior dissection .  Hemostasis is then confirmed adequate. Both ureters are visualized, normal in size with normal peristaltic activity.   All sponges and retractors are removed. Under fascia hemostasis is completed with cauterization. The fascia is closed with 2 running sutures of 0 Vicryl meeting midline. The wound is irrigated with warm saline and hemostasis is completed with cauterization.The subcuticular layer is closed with interrupted 3-0 Plain sutures.  The skin is closed with a subcuticular suture of 3-0 Monocryl and Steri-Strips.  Instruments and sponge count is complete 2.   Estimated blood loss is 250 cc.  The patient is repositioned in lithotomy to proceed with post-operative cystoscopy showing an intact bladder and 2 normal ureteral jets. Foley is reinserted.  The procedure is well tolerated by the patient is taken to recovery room in a well and stable condition.  Specimen: Uterus and  Tubes weighing 1747 g sent to pathology

## 2018-03-23 NOTE — Interval H&P Note (Signed)
History and Physical Interval Note:  03/23/2018 9:13 AM  Brittany Taylor  has presented today for surgery, with the diagnosis of Uterine Fibroids  The various methods of treatment have been discussed with the patient and family. After consideration of risks, benefits and other options for treatment, the patient has consented to  Procedure(s): HYSTERECTOMY SUPRACERVICAL ABDOMINAL (Bilateral) as a surgical intervention .  The patient's history has been reviewed, patient examined, no change in status, stable for surgery.  I have reviewed the patient's chart and labs.  Questions were answered to the patient's satisfaction.     Katharine Look A Ilan Kahrs

## 2018-03-23 NOTE — Anesthesia Preprocedure Evaluation (Signed)
Anesthesia Evaluation  Patient identified by MRN, date of birth, ID band Patient awake    Reviewed: Allergy & Precautions, H&P , NPO status , Patient's Chart, lab work & pertinent test results  History of Anesthesia Complications (+) PONV and history of anesthetic complications  Airway Mallampati: II   Neck ROM: full    Dental   Pulmonary asthma ,    breath sounds clear to auscultation       Cardiovascular hypertension,  Rhythm:regular Rate:Normal     Neuro/Psych PSYCHIATRIC DISORDERS Anxiety Depression    GI/Hepatic GERD  ,  Endo/Other  diabetes, Type 2obese  Renal/GU      Musculoskeletal  (+) Arthritis ,   Abdominal   Peds  Hematology   Anesthesia Other Findings   Reproductive/Obstetrics                             Anesthesia Physical Anesthesia Plan  ASA: II  Anesthesia Plan: General   Post-op Pain Management:    Induction: Intravenous  PONV Risk Score and Plan: 4 or greater and Ondansetron, Dexamethasone, Midazolam, Scopolamine patch - Pre-op and Treatment may vary due to age or medical condition  Airway Management Planned: Oral ETT  Additional Equipment:   Intra-op Plan:   Post-operative Plan: Extubation in OR  Informed Consent:   Plan Discussed with: CRNA, Anesthesiologist and Surgeon  Anesthesia Plan Comments:         Anesthesia Quick Evaluation

## 2018-03-23 NOTE — Anesthesia Postprocedure Evaluation (Signed)
Anesthesia Post Note  Patient: Brittany Taylor  Procedure(s) Performed: HYSTERECTOMY SUPRACERVICAL ABDOMINAL (Bilateral Abdomen) CYSTOSCOPY (N/A Urethra)     Patient location during evaluation: PACU Anesthesia Type: General Level of consciousness: awake and alert Pain management: pain level controlled Vital Signs Assessment: post-procedure vital signs reviewed and stable Respiratory status: spontaneous breathing, nonlabored ventilation, respiratory function stable and patient connected to nasal cannula oxygen Cardiovascular status: blood pressure returned to baseline and stable Postop Assessment: no apparent nausea or vomiting Anesthetic complications: no    Last Vitals:  Vitals:   03/23/18 1650 03/23/18 1730  BP: 125/79   Pulse: 93   Resp: 17 18  Temp: 36.7 C   SpO2: 97% 97%    Last Pain:  Vitals:   03/23/18 1730  TempSrc:   PainSc: 3    Pain Goal: Patients Stated Pain Goal: 3 (03/23/18 0801)               Martin

## 2018-03-24 ENCOUNTER — Encounter (HOSPITAL_COMMUNITY): Payer: Self-pay | Admitting: Obstetrics and Gynecology

## 2018-03-24 ENCOUNTER — Other Ambulatory Visit: Payer: Self-pay

## 2018-03-24 LAB — CBC
HEMATOCRIT: 28.1 % — AB (ref 36.0–46.0)
Hemoglobin: 9.1 g/dL — ABNORMAL LOW (ref 12.0–15.0)
MCH: 29.5 pg (ref 26.0–34.0)
MCHC: 32.4 g/dL (ref 30.0–36.0)
MCV: 91.2 fL (ref 78.0–100.0)
PLATELETS: 279 10*3/uL (ref 150–400)
RBC: 3.08 MIL/uL — AB (ref 3.87–5.11)
RDW: 15.5 % (ref 11.5–15.5)
WBC: 5.3 10*3/uL (ref 4.0–10.5)

## 2018-03-24 LAB — GLUCOSE, CAPILLARY: Glucose-Capillary: 104 mg/dL — ABNORMAL HIGH (ref 65–99)

## 2018-03-24 MED ORDER — SIMETHICONE 80 MG PO CHEW
80.0000 mg | CHEWABLE_TABLET | Freq: Four times a day (QID) | ORAL | Status: DC | PRN
  Administered 2018-03-24 – 2018-03-25 (×2): 80 mg via ORAL
  Filled 2018-03-24 (×2): qty 1

## 2018-03-24 NOTE — Progress Notes (Signed)
Brittany Taylor is a2 y.o.  [id number]  Post Op Date #1: Abdominal Supra-cervical Hyaterectomy/Left Salpingectomy and Cystoscopy  Subjective: Patient is Doing well postoperatively. Patient has Pain is controlled with current analgesics. Medications being used: prescription NSAID's including Ketorolac 30 mg IV and narcotic analgesics including PCA Dilaudid. Ambulating without dizziness, passing flatus, tolerating liquids but hasn't voided since Foley was removed.   Objective: Vital signs in last 24 hours: Temp:  [97.5 F (36.4 C)-99.5 F (37.5 C)] 98.5 F (36.9 C) (05/07 0439) Pulse Rate:  [82-102] 82 (05/07 0439) Resp:  [15-24] 16 (05/07 0604) BP: (94-153)/(64-94) 94/64 (05/07 0439) SpO2:  [94 %-100 %] 99 % (05/07 0604) FiO2 (%):  [82 %-95 %] 82 % (05/07 0604) Weight:  [191 lb (86.6 kg)] 191 lb (86.6 kg) (05/06 0801)  Intake/Output from previous day: 05/06 0701 - 05/07 0700 In: 2400 [I.V.:2400] Out: 2350 [Urine:2000] Intake/Output this shift: No intake/output data recorded. Recent Labs  Lab 03/24/18 0601  WBC 5.3  HGB 9.1*  HCT 28.1*  PLT 279    No results for input(s): NA, K, CL, CO2, BUN, CREATININE, CALCIUM, PROT, BILITOT, ALKPHOS, ALT, AST, GLUCOSE in the last 168 hours.  Invalid input(s): LABALBU  EXAM: General: alert, cooperative and no distress Resp: clear to auscultation bilaterally Cardio: regular rate and rhythm, S1, S2 normal, no murmur, click, rub or gallop GI: bowel sounds present, dressing is clean dry and intact. Extremities: Homans sign is negative, no sign of DVT and SCD hose in place and functioning with no calf tenderness. Vaginal Bleeding: none   Assessment: s/p Procedure(s): HYSTERECTOMY SUPRACERVICAL ABDOMINAL CYSTOSCOPY: stable, progressing well and anemia  Plan: Advance diet Encourage ambulation Routine care  LOS: 1 day    Earnstine Regal, PA-C 03/24/2018 7:45 AM

## 2018-03-24 NOTE — Discharge Instructions (Signed)
[facility] @ [phone] if:  You have a temperature greater than or equal to 100.4 degrees Farenheit orally You have pain that is not made better by the pain medication given and taken as directed You have excessive bleeding or problems urinating  Take Colace (Docusate Sodium/Stool Softener) 100 mg 2-3 times daily while taking narcotic pain medicine to avoid constipation or until bowel movements are regular. Take Ibuprofen 600 mg with food every 6 hours for 5 days then as needed for pain  You may drive after 2  weeks You may walk up steps  You may shower  You may resume a regular diet  Keep incisions clean and dry;  remove honeycomb dressing on Mar 30, 2018 Do not lift over 15 pounds for 6 weeks Avoid anything in vagina for 6 weeks (or until after your post-operative visit)

## 2018-03-25 LAB — CBC
HCT: 29.5 % — ABNORMAL LOW (ref 36.0–46.0)
Hemoglobin: 9.7 g/dL — ABNORMAL LOW (ref 12.0–15.0)
MCH: 30.2 pg (ref 26.0–34.0)
MCHC: 32.9 g/dL (ref 30.0–36.0)
MCV: 91.9 fL (ref 78.0–100.0)
PLATELETS: 304 10*3/uL (ref 150–400)
RBC: 3.21 MIL/uL — AB (ref 3.87–5.11)
RDW: 15.2 % (ref 11.5–15.5)
WBC: 5.9 10*3/uL (ref 4.0–10.5)

## 2018-03-25 MED ORDER — OXYCODONE-ACETAMINOPHEN 5-325 MG PO TABS: ORAL_TABLET | ORAL | 0 refills | Status: DC

## 2018-03-25 MED ORDER — DOCUSATE SODIUM 100 MG PO CAPS: 100.0000 mg | ORAL_CAPSULE | Freq: Two times a day (BID) | ORAL | 0 refills | Status: AC

## 2018-03-25 MED ORDER — IBUPROFEN 600 MG PO TABS: ORAL_TABLET | ORAL | 1 refills | Status: AC

## 2018-03-25 MED ORDER — SIMETHICONE 80 MG PO CHEW: 80.0000 mg | CHEWABLE_TABLET | Freq: Four times a day (QID) | ORAL | 0 refills | Status: AC | PRN

## 2018-03-25 NOTE — Discharge Summary (Signed)
Physician Discharge Summary  Patient ID: Brittany Taylor MRN: [id number] DOB/AGE: 21-Aug-1956 62 y.o.  Admit date: 03/23/2018 Discharge date: 03/25/2018   Discharge Diagnoses: Rapidly Enlarging Uterine Fibroids (Post menopausal) Active Problems:   Fibroid, uterine   Operation: Abdominal Supra-cervical Hysterectomy, Left Salpingectomy and Cystoscopy   Discharged Condition: Good  Hospital Course: On the date of admission the patient underwent the aforementioned procedures and tolerated them well.  Post operative course was marked by transient urinary retention that spontaneously resolved overnight.  By post operative day #2 the patient had resumed bowel and bladder function and was deemed ready for discharge home.  Discharge hemoglobin was 9.1  Disposition:  Home to Self Care  Discharge Medications:  Allergies as of 03/25/2018      Reactions   Morphine Itching, Nausea And Vomiting   Ramipril Other (See Comments)   ANGIOEDEMA   Mold Extract [trichophyton] Itching, Cough   Throat itches   Latex Rash   Used latex gloves with powder, rash occured   Mercurochrome [merbromin] Rash      Medication List    TAKE these medications   aspirin 81 MG tablet Take 162 mg by mouth daily.   atorvastatin 40 MG tablet Commonly known as:  LIPITOR TAKE 1 TABLET (40 MG TOTAL) BY MOUTH DAILY.   azelastine 0.05 % ophthalmic solution Commonly known as:  OPTIVAR Place 1 drop into both eyes 2 (two) times daily as needed (for itchy/allergy eyes).   budesonide-formoterol 160-4.5 MCG/ACT inhaler Commonly known as:  SYMBICORT Inhale 1-2 puffs into the lungs 2 (two) times daily.   EPINEPHrine 0.3 mg/0.3 mL Soaj injection Commonly known as:  EPI-PEN Inject 0.3 mg into the muscle daily as needed for anaphylaxis.   FLUoxetine 20 MG capsule Commonly known as:  PROZAC TAKE 2 CAPSULES BY MOUTH ONCE DAILY   fluticasone 50 MCG/ACT nasal spray Commonly known as:  FLONASE Place 2 sprays into both nostrils  daily as needed for allergies.   glucose blood test strip Commonly known as:  ONETOUCH VERIO 1 each by Other route daily. And lancets 1/day 250.00   guanFACINE 1 MG tablet Commonly known as:  TENEX Take 0.5 tablets (0.5 mg total) by mouth daily. What changed:  when to take this   hydrOXYzine 10 MG tablet Commonly known as:  ATARAX/VISTARIL Take 10 mg by mouth every 4 (four) hours as needed for itching.   ibuprofen 600 MG tablet Commonly known as:  ADVIL,MOTRIN 1  po  pc every 6 hours with food, for 5 days then as needed for pain What changed:    medication strength  how much to take  how to take this  when to take this  reasons to take this  additional instructions   loratadine 10 MG tablet Commonly known as:  CLARITIN Take 10 mg by mouth daily.   montelukast 10 MG tablet Commonly known as:  SINGULAIR Take 10 mg by mouth at bedtime.   NON FORMULARY Inject into the skin once a week. Allergy shots   oxyCODONE-acetaminophen 5-325 MG tablet Commonly known as:  PERCOCET/ROXICET 1  po every 6 hours as needed for post operative breakthrough pain   SitaGLIPtin-MetFORMIN HCl 50-1000 MG Tb24 Commonly known as:  JANUMET XR Take 2 tablets by mouth every morning.   triamcinolone ointment 0.1 % Commonly known as:  KENALOG Apply 1 application topically 2 (two) times daily as needed (for psoriasis).   triamterene-hydrochlorothiazide 37.5-25 MG tablet Commonly known as:  MAXZIDE-25 TAKE 1 TABLET BY MOUTH DAILY.  VAGIFEM 10 MCG Tabs vaginal tablet Generic drug:  Estradiol Place 10 mcg vaginally 2 (two) times a week.   VENTOLIN HFA 108 (90 Base) MCG/ACT inhaler Generic drug:  albuterol Inhale 1 puff into the lungs every 6 (six) hours as needed for wheezing or shortness of breath.         Follow-up:  Dr. Katharine Look A. [facility] [place] on May 01, 2018 at 11:15 a.m.    Signed: Earnstine Regal, PA-C 03/25/2018, 7:57 AM

## 2018-03-25 NOTE — Progress Notes (Signed)
Pt discharged home with printed instructions. Pt verbalized an understanding. No concerns noted. Macil Crady L Tarryn Bogdan, RN 

## 2018-03-25 NOTE — Progress Notes (Addendum)
Brittany Taylor is a38 y.o.  [id number]  Post Op Date # 2: Abdominal Supra-cervical Hysterectomy/Left Salpingectomy and Cystoscopy  Subjective: Patient is Doing well postoperatively. Patient has Pain is controlled with current analgesics. Medications being used: prescription NSAID's including Ibuprofen 600 mg and narcotic analgesics including Percocet 5/325 mg. Ambulating in the halls,  is now emptying her bladder completely, passing flatus and tolerating a regular diet.   Objective: Vital signs in last 24 hours: Temp:  [98 F (36.7 C)-99.9 F (37.7 C)] 98.8 F (37.1 C) (05/08 0453) Pulse Rate:  [87-102] 87 (05/08 0453) Resp:  [16-19] 18 (05/08 0453) BP: (101-135)/(50-72) 128/69 (05/08 0453) SpO2:  [92 %-97 %] 95 % (05/08 0453)  Intake/Output from previous day: 05/07 0701 - 05/08 0700 In: 850 [P.O.:850] Out: 1950 [Urine:1950] Intake/Output this shift: No intake/output data recorded. Recent Labs  Lab 03/24/18 0601  WBC 5.3  HGB 9.1*  HCT 28.1*  PLT 279    No results for input(s): NA, K, CL, CO2, BUN, CREATININE, CALCIUM, PROT, BILITOT, ALKPHOS, ALT, AST, GLUCOSE in the last 168 hours.  Invalid input(s): LABALBU  EXAM: General: alert, cooperative and no distress Resp: clear to auscultation bilaterally Cardio: regular rate and rhythm, S1, S2 normal, no murmur, click, rub or gallop GI: Bowel sounds present, soft but very distended;  wound dressing is intact and dry with  #2  dried stains 9 (size of a pencil eraser)  in lower half of dressing. Extremities: Homans sign is negative, no sign of DVT and no calf tenderness Vaginal Bleeding: none   Assessment: s/p Procedure(s): HYSTERECTOMY SUPRACERVICAL ABDOMINAL CYSTOSCOPY: stable, progressing well and anemia  Plan: Discharge home Routine care.  LOS: 2 days    Earnstine Regal, PA-C 03/25/2018 7:44 AM   Seen and agreed Has ambulated a lot with passing gas Repeat CBC is normal Abdomen: normal BS x4, appropriately  tender D/C instructions reviewed

## 2018-07-01 ENCOUNTER — Other Ambulatory Visit: Payer: Self-pay | Admitting: Endocrinology

## 2018-07-24 ENCOUNTER — Other Ambulatory Visit: Payer: Self-pay | Admitting: Endocrinology

## 2018-08-03 ENCOUNTER — Encounter: Payer: Self-pay | Admitting: Endocrinology

## 2018-08-03 ENCOUNTER — Ambulatory Visit: Payer: BLUE CROSS/BLUE SHIELD | Admitting: Endocrinology

## 2018-08-03 VITALS — BP 108/74 | HR 81 | Ht 64.0 in | Wt 188.0 lb

## 2018-08-03 DIAGNOSIS — E119 Type 2 diabetes mellitus without complications: Secondary | ICD-10-CM | POA: Diagnosis not present

## 2018-08-03 LAB — POCT GLYCOSYLATED HEMOGLOBIN (HGB A1C): Hemoglobin A1C: 5.9 % — AB (ref 4.0–5.6)

## 2018-08-03 NOTE — Patient Instructions (Signed)
Please come back for a follow-up appointment in 1-2 weeks.

## 2018-08-03 NOTE — Progress Notes (Signed)
   Subjective:    Patient ID: Brittany Taylor, female    DOB: [birth year], 62 y.o.   MRN: [id number]  HPI Too early for cpx.  Will come back soon   Review of Systems     Objective:   Physical Exam        Assessment & Plan:

## 2018-08-25 ENCOUNTER — Ambulatory Visit (INDEPENDENT_AMBULATORY_CARE_PROVIDER_SITE_OTHER): Payer: BLUE CROSS/BLUE SHIELD | Admitting: Endocrinology

## 2018-08-25 VITALS — BP 118/72 | HR 91 | Ht 64.0 in | Wt 189.2 lb

## 2018-08-25 DIAGNOSIS — Z0000 Encounter for general adult medical examination without abnormal findings: Secondary | ICD-10-CM

## 2018-08-25 DIAGNOSIS — D649 Anemia, unspecified: Secondary | ICD-10-CM

## 2018-08-25 DIAGNOSIS — Z780 Asymptomatic menopausal state: Secondary | ICD-10-CM | POA: Diagnosis not present

## 2018-08-25 DIAGNOSIS — Z23 Encounter for immunization: Secondary | ICD-10-CM

## 2018-08-25 DIAGNOSIS — E119 Type 2 diabetes mellitus without complications: Secondary | ICD-10-CM | POA: Diagnosis not present

## 2018-08-25 NOTE — Progress Notes (Signed)
Subjective:    Patient ID: Brittany Taylor, female    DOB: [birth year], 62 y.o.   MRN: [id number]  HPI Pt is here for regular wellness examination, and is feeling pretty well in general, and says chronic med probs are stable, except as noted below Past Medical History:  Diagnosis Date  . Allergy-induced asthma    daily inhaler  . Anxiety   . Arthritis    hands  . Dental crown present   . Depression   . GERD (gastroesophageal reflux disease)   . High cholesterol   . Hypertension    states under control with med., has been on med. x 10 yr.  . Non-insulin dependent type 2 diabetes mellitus ([place])   . PONV (postoperative nausea and vomiting)    patient thinks related to Morphine  . Psoriasis    arms, bilateral leg, back, breast, neck, ears  . Stenosing tenosynovitis of finger of right hand 03/2015   right ring finger    Past Surgical History:  Procedure Laterality Date  . BREAST REDUCTION SURGERY  2000  . CYSTOSCOPY N/A 03/23/2018   Procedure: CYSTOSCOPY;  Surgeon: Delsa Bern, MD;  Location: [facility];  Service: Gynecology;  Laterality: N/A;  . LEEP  03/18/2003  . MYOMECTOMY  1995  . POLYPECTOMY  01/2015   uterine  . SUPRACERVICAL ABDOMINAL HYSTERECTOMY Bilateral 03/23/2018   Procedure: HYSTERECTOMY SUPRACERVICAL ABDOMINAL;  Surgeon: Delsa Bern, MD;  Location: [facility];  Service: Gynecology;  Laterality: Bilateral;  . TRIGGER FINGER RELEASE Left 06/15/2009   thumb  . TRIGGER FINGER RELEASE Right 11/15/2008   thumb  . TRIGGER FINGER RELEASE Right 04/13/2015   Procedure: RELEASE A-1 PULLEY RIGHT RING FINGER;  Surgeon: Daryll Brod, MD;  Location: [facility];  Service: Orthopedics;  Laterality: Right;  . WISDOM TOOTH EXTRACTION  1972    Social History   Socioeconomic History  . Marital status: Single    Spouse name: Not on file  . Number of children: Not on file  . Years of education: Not on file  . Highest education level: Not on file  Occupational History  .  Occupation: Programmer, multimedia: [employer]  Social Needs  . Financial resource strain: Not on file  . Food insecurity:    Worry: Not on file    Inability: Not on file  . Transportation needs:    Medical: Not on file    Non-medical: Not on file  Tobacco Use  . Smoking status: Never Smoker  . Smokeless tobacco: Never Used  Substance and Sexual Activity  . Alcohol use: Yes    Comment: occasionally  . Drug use: No  . Sexual activity: Not on file  Lifestyle  . Physical activity:    Days per week: Not on file    Minutes per session: Not on file  . Stress: Not on file  Relationships  . Social connections:    Talks on phone: Not on file    Gets together: Not on file    Attends religious service: Not on file    Active member of club or organization: Not on file    Attends meetings of clubs or organizations: Not on file    Relationship status: Not on file  . Intimate partner violence:    Fear of current or ex partner: Not on file    Emotionally abused: Not on file    Physically abused: Not on file    Forced sexual activity: Not on  file  Other Topics Concern  . Not on file  Social History Narrative  . Not on file    Current Outpatient Medications on File Prior to Visit  Medication Sig Dispense Refill  . aspirin 81 MG tablet Take 162 mg by mouth daily.     Marland Kitchen azelastine (OPTIVAR) 0.05 % ophthalmic solution Place 1 drop into both eyes 2 (two) times daily as needed (for itchy/allergy eyes).     . budesonide-formoterol (SYMBICORT) 160-4.5 MCG/ACT inhaler Inhale 1-2 puffs into the lungs 2 (two) times daily.     Marland Kitchen docusate sodium (COLACE) 100 MG capsule Take 1 capsule (100 mg total) by mouth 2 (two) times daily. 10 capsule 0  . EPINEPHrine 0.3 mg/0.3 mL IJ SOAJ injection Inject 0.3 mg into the muscle daily as needed for anaphylaxis.  0  . Estradiol (VAGIFEM) 10 MCG TABS vaginal tablet Place 10 mcg vaginally 2 (two) times a week.    Marland Kitchen FLUoxetine (PROZAC) 20 MG capsule take 2  capsules by mouth once daily 180 capsule 1  . fluticasone (FLONASE) 50 MCG/ACT nasal spray Place 2 sprays into both nostrils daily as needed for allergies.    Marland Kitchen glucose blood (ONETOUCH VERIO) test strip 1 each by Other route daily. And lancets 1/day 250.00 100 each 12  . guanFACINE (TENEX) 1 MG tablet Take 0.5 tablets (0.5 mg total) by mouth daily. (Patient taking differently: Take 0.5 mg by mouth at bedtime. ) 45 tablet 3  . hydrOXYzine (ATARAX/VISTARIL) 10 MG tablet Take 10 mg by mouth every 4 (four) hours as needed for itching.   0  . ibuprofen (ADVIL,MOTRIN) 600 MG tablet 1  po  pc every 6 hours with food, for 5 days then as needed for pain 30 tablet 1  . loratadine (CLARITIN) 10 MG tablet Take 10 mg by mouth daily.    . montelukast (SINGULAIR) 10 MG tablet Take 10 mg by mouth at bedtime.  0  . NON FORMULARY Inject into the skin once a week. Allergy shots    . simethicone (MYLICON) 80 MG chewable tablet Chew 1 tablet (80 mg total) by mouth 4 (four) times daily as needed for flatulence. 30 tablet 0  . SitaGLIPtin-MetFORMIN HCl (JANUMET XR) 50-1000 MG TB24 Take 2 tablets by mouth every morning. 180 tablet 3  . STELARA 45 MG/0.5ML SOSY injection INJECT THE CONTENTS OF 1 SYRINGE INTO THE SKIN NOW, THEN IN 1 MONTH INJECT THE CONTENTS OF 1 SYRINGE, THEN INJECT EVERY 3 MONTHS  5  . triamcinolone ointment (KENALOG) 0.1 % Apply 1 application topically 2 (two) times daily as needed (for psoriasis).   0  . triamterene-hydrochlorothiazide (MAXZIDE-25) 37.5-25 MG tablet TAKE ONE TABLET BY MOUTH ONE TIME DAILY  90 tablet 1  . VENTOLIN HFA 108 (90 Base) MCG/ACT inhaler Inhale 1 puff into the lungs every 6 (six) hours as needed for wheezing or shortness of breath.  0   No current facility-administered medications on file prior to visit.     Allergies  Allergen Reactions  . Ace Inhibitors Other (See Comments)    ANGIOEDEMA   . Morphine Itching and Nausea And Vomiting  . Ramipril Other (See Comments)     ANGIOEDEMA  . Mold Extract [Trichophyton] Itching and Cough    Throat itches  . Latex Rash    Used latex gloves with powder, rash occured  . Mercurochrome [Merbromin] Rash    No family history on file.  BP 118/72 (BP Location: Right Arm, Patient Position: Sitting, Cuff Size:  Normal)   Pulse 91   Ht 5\' 4"  (1.626 m)   Wt 189 lb 3.2 oz (85.8 kg)   SpO2 99%   BMI 32.48 kg/m     Review of Systems Denies fever, fatigue, visual loss, hearing loss, chest pain, sob, back pain, depression, cold intolerance, BRBPR, hematuria, syncope, numbness, easy bruising, and rash.  Rhinorrhea is well-controlled.     Objective:   Physical Exam VS: see vs page GEN: no distress HEAD: head: no deformity eyes: no periorbital swelling, no proptosis external nose and ears are normal mouth: no lesion seen NECK: supple, thyroid is not enlarged CHEST WALL: no deformity LUNGS: clear to auscultation CV: reg rate and rhythm, no murmur ABD: abdomen is soft, nontender.  no hepatosplenomegaly.  not distended.  no hernia MUSCULOSKELETAL: muscle bulk and strength are grossly normal.  no obvious joint swelling.  gait is normal and steady EXTEMITIES: no deformity.  no ulcer on the feet.  feet are of normal color and temp.  no edema PULSES: dorsalis pedis intact bilat.  no carotid bruit NEURO:  cn 2-12 grossly intact.   readily moves all 4's.  sensation is intact to touch on the feet SKIN:  Normal texture and temperature.  No rash or suspicious lesion is visible.  Large areas of (chronic) psoriasis.   NODES:  None palpable at the neck PSYCH: alert, well-oriented.  Does not appear anxious nor depressed.     I personally reviewed electrocardiogram tracing (today):  Indication: wellness.  Impression: NSR.  ST-TWA Compared to 2018: no significant change      Assessment & Plan:  Wellness visit today, with problems stable, except as noted.  Patient Instructions  Please consider these measures for your health:   minimize alcohol.  Do not use tobacco products.  Have a colonoscopy at least every 10 years from age 72.  Women should have an annual mammogram from age 4.  Keep firearms safely stored.  Always use seat belts.  have working smoke alarms in your home.  See an eye doctor and dentist regularly.  Never drive under the influence of alcohol or drugs (including prescription drugs).   blood tests are requested for you today.  We'll let you know about the results. Best wishes with your new primary care provider.

## 2018-08-25 NOTE — Patient Instructions (Addendum)
Please consider these measures for your health:  minimize alcohol.  Do not use tobacco products.  Have a colonoscopy at least every 10 years from age 62.  Women should have an annual mammogram from age 54.  Keep firearms safely stored.  Always use seat belts.  have working smoke alarms in your home.  See an eye doctor and dentist regularly.  Never drive under the influence of alcohol or drugs (including prescription drugs).   blood tests are requested for you today.  We'll let you know about the results. Best wishes with your new primary care provider.

## 2018-09-10 ENCOUNTER — Other Ambulatory Visit: Payer: Self-pay | Admitting: Endocrinology

## 2018-09-10 NOTE — Telephone Encounter (Signed)
Please address refill request 

## 2018-10-01 ENCOUNTER — Other Ambulatory Visit: Payer: Self-pay | Admitting: Endocrinology

## 2018-10-29 ENCOUNTER — Encounter: Payer: Self-pay | Admitting: Family Medicine

## 2018-10-29 ENCOUNTER — Ambulatory Visit (INDEPENDENT_AMBULATORY_CARE_PROVIDER_SITE_OTHER): Payer: BLUE CROSS/BLUE SHIELD | Admitting: Family Medicine

## 2018-10-29 VITALS — BP 98/70 | HR 90 | Temp 97.8°F | Ht 64.0 in | Wt 191.0 lb

## 2018-10-29 DIAGNOSIS — J3089 Other allergic rhinitis: Secondary | ICD-10-CM | POA: Diagnosis not present

## 2018-10-29 DIAGNOSIS — E119 Type 2 diabetes mellitus without complications: Secondary | ICD-10-CM | POA: Diagnosis not present

## 2018-10-29 DIAGNOSIS — I10 Essential (primary) hypertension: Secondary | ICD-10-CM | POA: Diagnosis not present

## 2018-10-29 DIAGNOSIS — L409 Psoriasis, unspecified: Secondary | ICD-10-CM

## 2018-10-29 MED ORDER — BUDESONIDE-FORMOTEROL FUMARATE 160-4.5 MCG/ACT IN AERO: 1.0000 | INHALATION_SPRAY | Freq: Two times a day (BID) | RESPIRATORY_TRACT | 3 refills | Status: DC

## 2018-10-29 NOTE — Patient Instructions (Signed)
Allergic Rhinitis, Adult Allergic rhinitis is an allergic reaction that affects the mucous membrane inside the nose. It causes sneezing, a runny or stuffy nose, and the feeling of mucus going down the back of the throat (postnasal drip). Allergic rhinitis can be mild to severe. There are two types of allergic rhinitis:  Seasonal. This type is also called hay fever. It happens only during certain seasons.  Perennial. This type can happen at any time of the year.  What are the causes? This condition happens when the body's defense system (immune system) responds to certain harmless substances called allergens as though they were germs.  Seasonal allergic rhinitis is triggered by pollen, which can come from grasses, trees, and weeds. Perennial allergic rhinitis may be caused by:  House dust mites.  Pet dander.  Mold spores.  What are the signs or symptoms? Symptoms of this condition include:  Sneezing.  Runny or stuffy nose (nasal congestion).  Postnasal drip.  Itchy nose.  Tearing of the eyes.  Trouble sleeping.  Daytime sleepiness.  How is this diagnosed? This condition may be diagnosed based on:  Your medical history.  A physical exam.  Tests to check for related conditions, such as: ? Asthma. ? Pink eye. ? Ear infection. ? Upper respiratory infection.  Tests to find out which allergens trigger your symptoms. These may include skin or blood tests.  How is this treated? There is no cure for this condition, but treatment can help control symptoms. Treatment may include:  Taking medicines that block allergy symptoms, such as antihistamines. Medicine may be given as a shot, nasal spray, or pill.  Avoiding the allergen.  Desensitization. This treatment involves getting ongoing shots until your body becomes less sensitive to the allergen. This treatment may be done if other treatments do not help.  If taking medicine and avoiding the allergen does not work, new,  stronger medicines may be prescribed.  Follow these instructions at home:  Find out what you are allergic to. Common allergens include smoke, dust, and pollen.  Avoid the things you are allergic to. These are some things you can do to help avoid allergens: ? Replace carpet with wood, tile, or vinyl flooring. Carpet can trap dander and dust. ? Do not smoke. Do not allow smoking in your home. ? Change your heating and air conditioning filter at least once a month. ? During allergy season:  Keep windows closed as much as possible.  Plan outdoor activities when pollen counts are lowest. This is usually during the evening hours.  When coming indoors, change clothing and shower before sitting on furniture or bedding.  Take over-the-counter and prescription medicines only as told by your health care provider.  Keep all follow-up visits as told by your health care provider. This is important. Contact a health care provider if:  You have a fever.  You develop a persistent cough.  You make whistling sounds when you breathe (you wheeze).  Your symptoms interfere with your normal daily activities. Get help right away if:  You have shortness of breath. Summary  This condition can be managed by taking medicines as directed and avoiding allergens.  Contact your health care provider if you develop a persistent cough or fever.  During allergy season, keep windows closed as much as possible. This information is not intended to replace advice given to you by your health care provider. Make sure you discuss any questions you have with your health care provider. Document Released: 07/30/2001 Document Revised: 12/12/2016  Document Reviewed: 12/12/2016 Elsevier Interactive Patient Education  [employer].  Managing Your Hypertension Hypertension is commonly called high blood pressure. This is when the force of your blood pressing against the walls of your arteries is too strong. Arteries  are blood vessels that carry blood from your heart throughout your body. Hypertension forces the heart to work harder to pump blood, and may cause the arteries to become narrow or stiff. Having untreated or uncontrolled hypertension can cause heart attack, stroke, kidney disease, and other problems. What are blood pressure readings? A blood pressure reading consists of a higher number over a lower number. Ideally, your blood pressure should be below 120/80. The first ("top") number is called the systolic pressure. It is a measure of the pressure in your arteries as your heart beats. The second ("bottom") number is called the diastolic pressure. It is a measure of the pressure in your arteries as the heart relaxes. What does my blood pressure reading mean? Blood pressure is classified into four stages. Based on your blood pressure reading, your health care provider may use the following stages to determine what type of treatment you need, if any. Systolic pressure and diastolic pressure are measured in a unit called mm Hg. Normal  Systolic pressure: below 425.  Diastolic pressure: below 80. Elevated  Systolic pressure: 956-387.  Diastolic pressure: below 80. Hypertension stage 1  Systolic pressure: 564-332.  Diastolic pressure: 95-18. Hypertension stage 2  Systolic pressure: 841 or above.  Diastolic pressure: 90 or above. What health risks are associated with hypertension? Managing your hypertension is an important responsibility. Uncontrolled hypertension can lead to:  A heart attack.  A stroke.  A weakened blood vessel (aneurysm).  Heart failure.  Kidney damage.  Eye damage.  Metabolic syndrome.  Memory and concentration problems.  What changes can I make to manage my hypertension? Hypertension can be managed by making lifestyle changes and possibly by taking medicines. Your health care provider will help you make a plan to bring your blood pressure within a normal  range. Eating and drinking  Eat a diet that is high in fiber and potassium, and low in salt (sodium), added sugar, and fat. An example eating plan is called the DASH (Dietary Approaches to Stop Hypertension) diet. To eat this way: ? Eat plenty of fresh fruits and vegetables. Try to fill half of your plate at each meal with fruits and vegetables. ? Eat whole grains, such as whole wheat pasta, brown rice, or whole grain bread. Fill about one quarter of your plate with whole grains. ? Eat low-fat diary products. ? Avoid fatty cuts of meat, processed or cured meats, and poultry with skin. Fill about one quarter of your plate with lean proteins such as fish, chicken without skin, beans, eggs, and tofu. ? Avoid premade and processed foods. These tend to be higher in sodium, added sugar, and fat.  Reduce your daily sodium intake. Most people with hypertension should eat less than 1,500 mg of sodium a day.  Limit alcohol intake to no more than 1 drink a day for nonpregnant women and 2 drinks a day for men. One drink equals 12 oz of beer, 5 oz of wine, or 1 oz of hard liquor. Lifestyle  Work with your health care provider to maintain a healthy body weight, or to lose weight. Ask what an ideal weight is for you.  Get at least 30 minutes of exercise that causes your heart to beat faster (aerobic exercise) most days  of the week. Activities may include walking, swimming, or biking.  Include exercise to strengthen your muscles (resistance exercise), such as weight lifting, as part of your weekly exercise routine. Try to do these types of exercises for 30 minutes at least 3 days a week.  Do not use any products that contain nicotine or tobacco, such as cigarettes and e-cigarettes. If you need help quitting, ask your health care provider.  Control any long-term (chronic) conditions you have, such as high cholesterol or diabetes. Monitoring  Monitor your blood pressure at home as told by your health care  provider. Your personal target blood pressure may vary depending on your medical conditions, your age, and other factors.  Have your blood pressure checked regularly, as often as told by your health care provider. Working with your health care provider  Review all the medicines you take with your health care provider because there may be side effects or interactions.  Talk with your health care provider about your diet, exercise habits, and other lifestyle factors that may be contributing to hypertension.  Visit your health care provider regularly. Your health care provider can help you create and adjust your plan for managing hypertension. Will I need medicine to control my blood pressure? Your health care provider may prescribe medicine if lifestyle changes are not enough to get your blood pressure under control, and if:  Your systolic blood pressure is 130 or higher.  Your diastolic blood pressure is 80 or higher.  Take medicines only as told by your health care provider. Follow the directions carefully. Blood pressure medicines must be taken as prescribed. The medicine does not work as well when you skip doses. Skipping doses also puts you at risk for problems. Contact a health care provider if:  You think you are having a reaction to medicines you have taken.  You have repeated (recurrent) headaches.  You feel dizzy.  You have swelling in your ankles.  You have trouble with your vision. Get help right away if:  You develop a severe headache or confusion.  You have unusual weakness or numbness, or you feel faint.  You have severe pain in your chest or abdomen.  You vomit repeatedly.  You have trouble breathing. Summary  Hypertension is when the force of blood pumping through your arteries is too strong. If this condition is not controlled, it may put you at risk for serious complications.  Your personal target blood pressure may vary depending on your medical  conditions, your age, and other factors. For most people, a normal blood pressure is less than 120/80.  Hypertension is managed by lifestyle changes, medicines, or both. Lifestyle changes include weight loss, eating a healthy, low-sodium diet, exercising more, and limiting alcohol. This information is not intended to replace advice given to you by your health care provider. Make sure you discuss any questions you have with your health care provider. Document Released: 07/29/2012 Document Revised: 10/02/2016 Document Reviewed: 10/02/2016 Elsevier Interactive Patient Education  [employer].

## 2018-10-29 NOTE — Progress Notes (Signed)
Subjective:    Patient ID: Brittany Taylor, female    DOB: [birth year], 62 y.o.   MRN: [id number]  No chief complaint on file.   HPI Patient was seen today for f/u on chronic conditions and TOC, previously seen by Dr. Loanne Drilling.  DM II: -taking janumet XR 50-1000 mg 2 tabs q am -not really checking fsbs at home.  Was 120s when last chcecked -Has a very a One Touch meter -seen by Endo, Dr. Loanne Drilling  Allergies: -followed by Allergist, Dr. Mosetta Anis -receives allergy shots wkly -taking flonase, Claritin, Singulair  Psoriasis: -taking Stelara 45mg /0.5 mg inj -noted improvement in skin since starting med -no longer having silver scaly skin -endorses mild pruritis -Taking Atarax 10 mg as needed -Followed by [facility], Dr. Druscilla Brownie  HTN: -taking: Trimethoprim-hydrochlorothiazide 37.5-25 mg daily -Not checking BP at home -Trying to drink more water  Past Medical History:  Diagnosis Date  . Allergy-induced asthma    daily inhaler  . Anxiety   . Arthritis    hands  . Dental crown present   . Depression   . GERD (gastroesophageal reflux disease)   . High cholesterol   . Hypertension    states under control with med., has been on med. x 10 yr.  . Non-insulin dependent type 2 diabetes mellitus ([place])   . PONV (postoperative nausea and vomiting)    patient thinks related to Morphine  . Psoriasis    arms, bilateral leg, back, breast, neck, ears  . Stenosing tenosynovitis of finger of right hand 03/2015   right ring finger    Allergies  Allergen Reactions  . Ace Inhibitors Other (See Comments)    ANGIOEDEMA   . Morphine Itching and Nausea And Vomiting  . Ramipril Other (See Comments)    ANGIOEDEMA  . Mold Extract [Trichophyton] Itching and Cough    Throat itches  . Latex Rash    Used latex gloves with powder, rash occured  . Mercurochrome [Merbromin] Rash    ROS General: Denies fever, chills, night sweats, changes in weight, changes in  appetite HEENT: Denies headaches, ear pain, changes in vision, rhinorrhea, sore throat CV: Denies CP, palpitations, SOB, orthopnea Pulm: Denies SOB, cough, wheezing GI: Denies abdominal pain, nausea, vomiting, diarrhea, constipation GU: Denies dysuria, hematuria, frequency, vaginal discharge Msk: Denies muscle cramps, joint pains Neuro: Denies weakness, numbness, tingling Skin: Denies rashes, bruising  +rash Psych: Denies depression, anxiety, hallucinations     Objective:    Blood pressure 98/70, pulse 90, temperature 97.8 F (36.6 C), temperature source Oral, height 5\' 4"  (1.626 m), weight 191 lb (86.6 kg), SpO2 98 %.   Gen. Pleasant, well-nourished, in no distress, normal affect   HEENT: [place]/AT, face symmetric, no scleral icterus, PERRLA, nares patent without drainage, pharynx without erythema or exudate. Lungs: no accessory muscle use, CTAB, no wheezes or rales Cardiovascular: RRR, no m/r/g, no peripheral edema Musculoskeletal: No deformities, no cyanosis or clubbing, normal tone Neuro:  A&Ox3, CN II-XII intact, normal gait Skin:  Warm, no lesions.  Hyperpigmented plaques on b/l arms, LEs, abd, back.  Dry appearing scale inferior to R eyebrow.  Wt Readings from Last 3 Encounters:  10/29/18 191 lb (86.6 kg)  08/25/18 189 lb 3.2 oz (85.8 kg)  08/03/18 188 lb (85.3 kg)    Lab Results  Component Value Date   WBC 5.9 03/25/2018   HGB 9.7 (L) 03/25/2018   HCT 29.5 (L) 03/25/2018   PLT 304 03/25/2018   GLUCOSE 94 03/11/2018  CHOL 134 08/08/2017   TRIG 76.0 08/08/2017   HDL 54.10 08/08/2017   LDLCALC 65 08/08/2017   ALT 23 08/08/2017   AST 25 08/08/2017   NA 140 03/11/2018   K 4.4 03/11/2018   CL 107 03/11/2018   CREATININE 0.82 03/11/2018   BUN 14 03/11/2018   CO2 22 03/11/2018   TSH 2.10 08/08/2017   HGBA1C 5.9 (A) 08/03/2018   MICROALBUR <0.7 08/08/2017    Assessment/Plan:  Psoriasis -continue stelara inj -managed by [facility], Dr, Allyson Sabal  Essential  hypertension -controlled -encouraged to check bp at home as may be able to reduce dose of triamterene-hydrochlorothiazide  -Continue try triamterene-hydrochlorothiazide 37.5-25 mg -discussed lifestyle modifications  Controlled type 2 diabetes mellitus without complication, without long-term current use of insulin ([place]) -last hgb A1C 5.9% on 08/03/18 -Continue Janumet Exar 50-1000 mg two tabs q am -continue checking f/u with Endo, Dr. Loanne Drilling  Seasonal allergic rhinitis due to other allergic trigger  -continue weekly allergy shots, Symbicort, Claritin, Singulair -Has EpiPen available -Continue following with allergist  symbicort inhaler refilled.  F/u in 3 months, sooner if needed  Grier Mitts, MD

## 2018-11-01 ENCOUNTER — Encounter: Payer: Self-pay | Admitting: Family Medicine

## 2018-11-16 ENCOUNTER — Other Ambulatory Visit: Payer: Self-pay | Admitting: Endocrinology

## 2019-01-09 ENCOUNTER — Other Ambulatory Visit: Payer: Self-pay | Admitting: Endocrinology

## 2019-01-09 NOTE — Telephone Encounter (Signed)
Please forward refill request to pt's new primary care provider.  

## 2019-01-17 ENCOUNTER — Other Ambulatory Visit: Payer: Self-pay | Admitting: Endocrinology

## 2019-01-17 NOTE — Telephone Encounter (Signed)
Please forward refill request to pt's new primary care provider.  

## 2019-01-22 ENCOUNTER — Encounter: Payer: Self-pay | Admitting: Family Medicine

## 2019-01-22 ENCOUNTER — Ambulatory Visit (INDEPENDENT_AMBULATORY_CARE_PROVIDER_SITE_OTHER): Payer: Self-pay | Admitting: Family Medicine

## 2019-01-22 VITALS — BP 106/78 | HR 90 | Temp 98.4°F | Wt 193.0 lb

## 2019-01-22 DIAGNOSIS — I10 Essential (primary) hypertension: Secondary | ICD-10-CM

## 2019-01-22 DIAGNOSIS — F339 Major depressive disorder, recurrent, unspecified: Secondary | ICD-10-CM

## 2019-01-22 MED ORDER — TRIAMTERENE-HCTZ 37.5-25 MG PO TABS: 1.0000 | ORAL_TABLET | Freq: Every day | ORAL | 2 refills | Status: DC

## 2019-01-22 MED ORDER — HYDROXYZINE HCL 10 MG PO TABS: 10.0000 mg | ORAL_TABLET | Freq: Four times a day (QID) | ORAL | 0 refills | Status: DC | PRN

## 2019-01-22 MED ORDER — FLUOXETINE HCL 20 MG PO CAPS: 40.0000 mg | ORAL_CAPSULE | Freq: Every day | ORAL | 2 refills | Status: DC

## 2019-01-22 NOTE — Progress Notes (Signed)
Subjective:    Patient ID: Brittany Taylor, female    DOB: 07-15-1956, 63 y.o.   MRN: [id number]  No chief complaint on file.   HPI Patient was seen today for refills on medications.  Pt states her pharmacy told her that she needed to be seen.  Pt out of Prozac 20 mg x 2 to 4 weeks.  States has been feeling okay, denies withdrawal symptoms.    Also requesting refill on triamterene-hydrochlorothiazide 37.5-25 mg.  Pt states BP has been low normal.  Pt denies headaches, chest pain, changes in vision, dizziness.  Pt has not been taking guanfacine 0.5 mg at night as her blood pressure has been low.  Past Medical History:  Diagnosis Date  . Allergy-induced asthma    daily inhaler  . Anxiety   . Arthritis    hands  . Dental crown present   . Depression   . GERD (gastroesophageal reflux disease)   . High cholesterol   . Hypertension    states under control with med., has been on med. x 10 yr.  . Non-insulin dependent type 2 diabetes mellitus ([place])   . PONV (postoperative nausea and vomiting)    patient thinks related to Morphine  . Psoriasis    arms, bilateral leg, back, breast, neck, ears  . Stenosing tenosynovitis of finger of right hand 03/2015   right ring finger    Allergies  Allergen Reactions  . Ace Inhibitors Other (See Comments)    ANGIOEDEMA   . Morphine Itching and Nausea And Vomiting  . Ramipril Other (See Comments)    ANGIOEDEMA  . Mold Extract [Trichophyton] Itching and Cough    Throat itches  . Latex Rash    Used latex gloves with powder, rash occured  . Mercurochrome [Merbromin] Rash    ROS General: Denies fever, chills, night sweats, changes in weight, changes in appetite HEENT: Denies headaches, ear pain, changes in vision, rhinorrhea, sore throat CV: Denies CP, palpitations, SOB, orthopnea Pulm: Denies SOB, cough, wheezing GI: Denies abdominal pain, nausea, vomiting, diarrhea, constipation GU: Denies dysuria, hematuria, frequency, vaginal  discharge Msk: Denies muscle cramps, joint pains Neuro: Denies weakness, numbness, tingling Skin: Denies rashes, bruising Psych: Denies anxiety, hallucinations  +depression    Objective:    Blood pressure 106/78, pulse 90, temperature 98.4 F (36.9 C), temperature source Oral, weight 193 lb (87.5 kg), SpO2 98 %.  Gen. Pleasant, well-nourished, in no distress, normal affect   HEENT: [place]/AT, face symmetric, no scleral icterus, PERRLA, nares patent without drainage Lungs: no accessory muscle use Cardiovascular: RRR, no m/r/g, no peripheral edema Neuro:  A&Ox3, CN II-XII intact, normal gait Skin:  Warm, no lesions/ rash  Wt Readings from Last 3 Encounters:  10/29/18 191 lb (86.6 kg)  08/25/18 189 lb 3.2 oz (85.8 kg)  08/03/18 188 lb (85.3 kg)    Lab Results  Component Value Date   WBC 5.9 03/25/2018   HGB 9.7 (L) 03/25/2018   HCT 29.5 (L) 03/25/2018   PLT 304 03/25/2018   GLUCOSE 94 03/11/2018   CHOL 134 08/08/2017   TRIG 76.0 08/08/2017   HDL 54.10 08/08/2017   LDLCALC 65 08/08/2017   ALT 23 08/08/2017   AST 25 08/08/2017   NA 140 03/11/2018   K 4.4 03/11/2018   CL 107 03/11/2018   CREATININE 0.82 03/11/2018   BUN 14 03/11/2018   CO2 22 03/11/2018   TSH 2.10 08/08/2017   HGBA1C 5.9 (A) 08/03/2018   MICROALBUR <0.7 08/08/2017  Assessment/Plan:  Essential hypertension  -controlled -continue current medications -will repeat BMP at next OFV. - Plan: triamterene-hydrochlorothiazide (MAXZIDE-25) 37.5-25 MG tablet  Depression, recurrent (HCC)  -PHQ 9 score 3 -GAD 7 score 2 - Plan: FLUoxetine (PROZAC) 20 MG capsule  F/u prn in the next 3-6 months  Grier Mitts, MD

## 2019-04-02 ENCOUNTER — Encounter: Payer: Self-pay | Admitting: Family Medicine

## 2019-04-02 ENCOUNTER — Ambulatory Visit (INDEPENDENT_AMBULATORY_CARE_PROVIDER_SITE_OTHER): Payer: Self-pay | Admitting: Family Medicine

## 2019-04-02 ENCOUNTER — Other Ambulatory Visit: Payer: Self-pay

## 2019-04-02 VITALS — BP 112/60 | HR 106 | Temp 99.0°F | Resp 12 | Ht 64.0 in | Wt 194.5 lb

## 2019-04-02 DIAGNOSIS — S61258A Open bite of other finger without damage to nail, initial encounter: Secondary | ICD-10-CM

## 2019-04-02 DIAGNOSIS — E119 Type 2 diabetes mellitus without complications: Secondary | ICD-10-CM

## 2019-04-02 DIAGNOSIS — L03011 Cellulitis of right finger: Secondary | ICD-10-CM

## 2019-04-02 DIAGNOSIS — W540XXA Bitten by dog, initial encounter: Secondary | ICD-10-CM

## 2019-04-02 DIAGNOSIS — Z23 Encounter for immunization: Secondary | ICD-10-CM

## 2019-04-02 MED ORDER — AMOXICILLIN-POT CLAVULANATE 875-125 MG PO TABS: 1.0000 | ORAL_TABLET | Freq: Two times a day (BID) | ORAL | 0 refills | Status: AC

## 2019-04-02 MED ORDER — METFORMIN HCL 1000 MG PO TABS: 1000.0000 mg | ORAL_TABLET | Freq: Two times a day (BID) | ORAL | 0 refills | Status: DC

## 2019-04-02 NOTE — Patient Instructions (Signed)
  [name] I have seen you today for an acute visit.  A few things to remember from today's visit:   Type 2 diabetes mellitus without complication, without long-term current use of insulin ([place]) - Plan: metFORMIN (GLUCOPHAGE) 1000 MG tablet  Dog bite of middle finger, initial encounter - Plan: amoxicillin-clavulanate (AUGMENTIN) 875-125 MG tablet  Continue monitoring blood sugars. Right hand elevation to control swelling of fingers.  Metformin 1000 mg twice daily added today. Please arrange appointment with your PCP to be seen in 2 3 months.  If medications prescribed today, they will not be refill upon request, a follow up appointment with PCP will be necessary to discuss continuation of of treatment if appropriate.     In general please monitor for signs of worsening symptoms and seek immediate medical attention if any concerning.  If symptoms are not resolved in a few days/weeks you should schedule a follow up appointment with your doctor, before if needed.  I hope you get better soon!

## 2019-04-02 NOTE — Assessment & Plan Note (Signed)
BS reported today suggest problem is adequate control. Metformin 1000 mg twice daily started today. Some side effect discussed. Clearly instructed about warning signs. Follow-up with PCP in 2 to 3 months.

## 2019-04-02 NOTE — Progress Notes (Signed)
ACUTE VISIT   HPI:  Chief Complaint  Patient presents with  . Animal Bite    Right hand swelling and pain, dog bite on yesterday by her own dog    [name] is a 62 y.o. female, who is here today complaining of finger edema, erythema, and pain right after her dog bit her. Yesterday around 6 PM right middle finger bite by her 63 yo gog. She states that she did not want to get up,so she picked her (dog) up, got upset and bit her. Dog vaccines up-to-date. Her dog is not usually aggressive but she is old and does not want to move much. Last Tdap about 10 years ago.  She has noted finger edema and erythema extending to dorsum of right hand. She has not noted chills, fever, unusual fatigue, body aches, hand numbness or tingling.  Right after bite she washed area with soap and water and has been applying topical Neosporin. Also applied local ice.  Pain is severe, constant, exacerbated by movement. Limitation of IP joint due to pain and edema. Today pain seems to be better. She has taken ibuprofen twice daily for pain.  DM 2 She also mentions that she cannot afford Janumet X are 50-1000 mg. In the past she took metformin and it was well-tolerated.  She has not taken medication a few weeks. BS 100-150. Denies abdominal pain, nausea,vomiting, polydipsia,polyuria, or polyphagia.  Lab Results  Component Value Date   HGBA1C 5.9 (A) 08/03/2018    Review of Systems  Constitutional: Positive for activity change. Negative for chills and diaphoresis.  HENT: Negative for sore throat and trouble swallowing.   Respiratory: Negative for shortness of breath and stridor.   Gastrointestinal: Negative for abdominal pain, nausea and vomiting.  Musculoskeletal: Negative for neck pain.  Skin: Positive for wound. Negative for pallor.  Neurological: Negative for weakness and headaches.   Rest of ROS refer to pertinent positive and negative described in HPI   Current  Outpatient Medications on File Prior to Visit  Medication Sig Dispense Refill  . aspirin 81 MG tablet Take 162 mg by mouth daily.     Marland Kitchen azelastine (OPTIVAR) 0.05 % ophthalmic solution Place 1 drop into both eyes 2 (two) times daily as needed (for itchy/allergy eyes).     . budesonide-formoterol (SYMBICORT) 160-4.5 MCG/ACT inhaler Inhale 1-2 puffs into the lungs 2 (two) times daily. 1 Inhaler 3  . docusate sodium (COLACE) 100 MG capsule Take 1 capsule (100 mg total) by mouth 2 (two) times daily. 10 capsule 0  . EPINEPHrine 0.3 mg/0.3 mL IJ SOAJ injection Inject 0.3 mg into the muscle daily as needed for anaphylaxis.  0  . Estradiol (VAGIFEM) 10 MCG TABS vaginal tablet Place 10 mcg vaginally 2 (two) times a week.    Marland Kitchen FLUoxetine (PROZAC) 20 MG capsule Take 2 capsules (40 mg total) by mouth daily. 180 capsule 2  . fluticasone (FLONASE) 50 MCG/ACT nasal spray Place 2 sprays into both nostrils daily as needed for allergies.    Marland Kitchen glucose blood (ONETOUCH VERIO) test strip 1 each by Other route daily. And lancets 1/day 250.00 100 each 12  . guanFACINE (TENEX) 1 MG tablet Take 0.5 tablets (0.5 mg total) by mouth daily. (Patient taking differently: Take 0.5 mg by mouth at bedtime. ) 45 tablet 3  . hydrOXYzine (ATARAX/VISTARIL) 10 MG tablet Take 1 tablet (10 mg total) by mouth every 6 (six) hours as needed for itching. 30 tablet 0  .  ibuprofen (ADVIL,MOTRIN) 600 MG tablet 1  po  pc every 6 hours with food, for 5 days then as needed for pain 30 tablet 1  . loratadine (CLARITIN) 10 MG tablet Take 10 mg by mouth daily.    . montelukast (SINGULAIR) 10 MG tablet Take 10 mg by mouth at bedtime.  0  . NON FORMULARY Inject into the skin once a week. Allergy shots    . simethicone (MYLICON) 80 MG chewable tablet Chew 1 tablet (80 mg total) by mouth 4 (four) times daily as needed for flatulence. 30 tablet 0  . STELARA 45 MG/0.5ML SOSY injection INJECT THE CONTENTS OF 1 SYRINGE INTO THE SKIN NOW, THEN IN 1 MONTH INJECT  THE CONTENTS OF 1 SYRINGE, THEN INJECT EVERY 3 MONTHS  5  . triamcinolone ointment (KENALOG) 0.1 % Apply 1 application topically 2 (two) times daily as needed (for psoriasis).   0  . triamterene-hydrochlorothiazide (MAXZIDE-25) 37.5-25 MG tablet Take 1 tablet by mouth daily. 90 tablet 2  . VENTOLIN HFA 108 (90 Base) MCG/ACT inhaler Inhale 1 puff into the lungs every 6 (six) hours as needed for wheezing or shortness of breath.  0   No current facility-administered medications on file prior to visit.      Past Medical History:  Diagnosis Date  . Allergy-induced asthma    daily inhaler  . Anxiety   . Arthritis    hands  . Dental crown present   . Depression   . GERD (gastroesophageal reflux disease)   . High cholesterol   . Hypertension    states under control with med., has been on med. x 10 yr.  . Non-insulin dependent type 2 diabetes mellitus ([place])   . PONV (postoperative nausea and vomiting)    patient thinks related to Morphine  . Psoriasis    arms, bilateral leg, back, breast, neck, ears  . Stenosing tenosynovitis of finger of right hand 03/2015   right ring finger   Allergies  Allergen Reactions  . Ace Inhibitors Other (See Comments)    ANGIOEDEMA   . Morphine Itching and Nausea And Vomiting  . Ramipril Other (See Comments)    ANGIOEDEMA  . Mold Extract [Trichophyton] Itching and Cough    Throat itches  . Latex Rash    Used latex gloves with powder, rash occured  . Mercurochrome [Merbromin] Rash    Social History   Socioeconomic History  . Marital status: Single    Spouse name: Not on file  . Number of children: Not on file  . Years of education: Not on file  . Highest education level: Not on file  Occupational History  . Occupation: Programmer, multimedia: [employer]  Social Needs  . Financial resource strain: Not on file  . Food insecurity:    Worry: Not on file    Inability: Not on file  . Transportation needs:    Medical: Not on file     Non-medical: Not on file  Tobacco Use  . Smoking status: Never Smoker  . Smokeless tobacco: Never Used  Substance and Sexual Activity  . Alcohol use: Yes    Comment: occasionally  . Drug use: No  . Sexual activity: Not on file  Lifestyle  . Physical activity:    Days per week: Not on file    Minutes per session: Not on file  . Stress: Not on file  Relationships  . Social connections:    Talks on phone: Not on file  Gets together: Not on file    Attends religious service: Not on file    Active member of club or organization: Not on file    Attends meetings of clubs or organizations: Not on file    Relationship status: Not on file  Other Topics Concern  . Not on file  Social History Narrative  . Not on file    Vitals:   04/02/19 1449  BP: 112/60  Pulse: (!) 106  Resp: 12  Temp: 99 F (37.2 C)  SpO2: 96%   Body mass index is 33.39 kg/m.   Physical Exam  Nursing note and vitals reviewed. Constitutional: She is oriented to person, place, and time. She appears well-developed. No distress.  HENT:  Head: Normocephalic and atraumatic.  Mouth/Throat: Mucous membranes are normal.  Eyes: Conjunctivae are normal.  Cardiovascular: Regular rhythm. Tachycardia present.  No murmur heard. Pulses:      Radial pulses are 2+ on the right side.  Respiratory: Effort normal and breath sounds normal. No respiratory distress.  Musculoskeletal:        General: No edema.     Right hand: She exhibits decreased range of motion (Right middle finger:MCP and PIP.), tenderness and swelling. She exhibits normal capillary refill. Normal strength noted.       Hands:  Neurological: She is alert and oriented to person, place, and time. She has normal strength.  Skin: Skin is warm. No rash noted. There is erythema.  Right hand: 2 puncture wounds on middle finger,edema and erythema. Erythema extends to mid dorsal aspect of hand.  See MS graphic.  Psychiatric: Her mood appears anxious.  Well  groomed, good eye contact.   Rest see pertinent positives and negatives per HPI.   ASSESSMENT AND PLAN:  Ms. Aleysia was seen today for animal bite.  Diagnoses and all orders for this visit:  Dog bite of middle finger, initial encounter Recommend keeping wound clean with soap and water. Right hand elevation. Tdap updated.  -     amoxicillin-clavulanate (AUGMENTIN) 875-125 MG tablet; Take 1 tablet by mouth 2 (two) times daily for 7 days. -     Tdap vaccine greater than or equal to 7yo IM  Cellulitis of finger of right hand Some side effects of antibiotic discussed. Instructed about warning signs.  -     amoxicillin-clavulanate (AUGMENTIN) 875-125 MG tablet; Take 1 tablet by mouth 2 (two) times daily for 7 days.   Diabetes BS reported today suggest problem is adequate control. Metformin 1000 mg twice daily started today. Some side effect discussed. Clearly instructed about warning signs. Follow-up with PCP in 2 to 3 months.    Return in about 2 months (around 06/02/2019) for PCP.   -Ms.Kellyann Ordway [place] was advised to seek immediate medical attention if sudden worsening symptoms or to follow if they persist or if new concerns arise.     Betty G. Martinique, MD  [facility]. [place] office.

## 2019-04-23 ENCOUNTER — Telehealth: Payer: Self-pay | Admitting: Family Medicine

## 2019-04-23 ENCOUNTER — Other Ambulatory Visit: Payer: Self-pay

## 2019-04-23 ENCOUNTER — Ambulatory Visit: Payer: Managed Care, Other (non HMO) | Admitting: Family Medicine

## 2019-04-23 NOTE — Telephone Encounter (Signed)
Copied from [place] [id number]. Topic: Quick Communication - Rx Refill/Question >> Apr 23, 2019 11:27 AM Richardo Priest, NT wrote: Medication:  triamcinolone ointment (KENALOG) 0.1 %  Has the patient contacted their pharmacy? Yes. Advised to call office.   Preferred Pharmacy (with phone number or street name):  [facility] [id number], [place] [phone] (Phone) [phone] (Fax)  Agent: Please be advised that RX refills may take up to 3 business days. We ask that you follow-up with your pharmacy.

## 2019-04-26 NOTE — Telephone Encounter (Signed)
Pt LOV was 04/02/2019 and last refill was 07/10/2015, this refill was provided by pt Dermatologist, please advise if ok to send refill

## 2019-04-27 ENCOUNTER — Encounter: Payer: Self-pay | Admitting: Gastroenterology

## 2019-04-27 NOTE — Telephone Encounter (Signed)
Ok to refill 

## 2019-04-28 LAB — HM DIABETES EYE EXAM

## 2019-04-29 ENCOUNTER — Other Ambulatory Visit: Payer: Self-pay

## 2019-04-29 MED ORDER — TRIAMCINOLONE ACETONIDE 0.1 % EX OINT: 1.0000 "application " | TOPICAL_OINTMENT | Freq: Two times a day (BID) | CUTANEOUS | 3 refills | Status: AC | PRN

## 2019-04-29 NOTE — Telephone Encounter (Signed)
Rx sent to pt pharmacy 

## 2019-05-04 NOTE — Telephone Encounter (Signed)
Pt is followed by [facility], Dr. Allyson Sabal for her psoriasis.  Would advise her to get note from her specialist.

## 2019-05-04 NOTE — Telephone Encounter (Signed)
Copied from [place] [id number]. Topic: General - Inquiry >> Apr 23, 2019 11:23 AM Richardo Priest, NT wrote: Reason for CRM: Patient is calling in requesting a work note. Please advise and call back is [phone]. Note needs to give her permission to be out of work for a certain period of time due to her psoriasis being flared up.

## 2019-05-05 NOTE — Telephone Encounter (Signed)
Spoke with pt verbalized understanding to request a work note from pt dermatology Dr Allyson Sabal. Pt state that she does not  Need one now but she will contact them in future

## 2019-06-09 ENCOUNTER — Encounter: Payer: Self-pay | Admitting: Family Medicine

## 2019-06-23 ENCOUNTER — Other Ambulatory Visit: Payer: Self-pay

## 2019-06-23 DIAGNOSIS — Z20822 Contact with and (suspected) exposure to covid-19: Secondary | ICD-10-CM

## 2019-06-25 LAB — NOVEL CORONAVIRUS, NAA: SARS-CoV-2, NAA: NOT DETECTED

## 2019-07-30 ENCOUNTER — Other Ambulatory Visit: Payer: Self-pay | Admitting: Family Medicine

## 2019-07-30 DIAGNOSIS — E119 Type 2 diabetes mellitus without complications: Secondary | ICD-10-CM

## 2019-08-02 NOTE — Telephone Encounter (Signed)
See Rx request ° °

## 2019-10-25 ENCOUNTER — Other Ambulatory Visit: Payer: Self-pay | Admitting: Family Medicine

## 2019-10-25 DIAGNOSIS — I10 Essential (primary) hypertension: Secondary | ICD-10-CM

## 2019-10-25 DIAGNOSIS — F339 Major depressive disorder, recurrent, unspecified: Secondary | ICD-10-CM

## 2019-10-25 NOTE — Telephone Encounter (Signed)
Pt needs appointment for Prozac refill

## 2019-10-25 NOTE — Telephone Encounter (Signed)
Pt needs appointment for refill of Fluoxetine, called pt mobile no option of leaving a voice message

## 2019-10-29 ENCOUNTER — Telehealth (INDEPENDENT_AMBULATORY_CARE_PROVIDER_SITE_OTHER): Payer: Self-pay | Admitting: Family Medicine

## 2019-10-29 DIAGNOSIS — E119 Type 2 diabetes mellitus without complications: Secondary | ICD-10-CM

## 2019-10-29 DIAGNOSIS — F339 Major depressive disorder, recurrent, unspecified: Secondary | ICD-10-CM

## 2019-10-29 DIAGNOSIS — I10 Essential (primary) hypertension: Secondary | ICD-10-CM

## 2019-10-29 MED ORDER — FLUOXETINE HCL 20 MG PO CAPS: 40.0000 mg | ORAL_CAPSULE | Freq: Every day | ORAL | 2 refills | Status: DC

## 2019-10-29 MED ORDER — TRIAMTERENE-HCTZ 37.5-25 MG PO TABS: 1.0000 | ORAL_TABLET | Freq: Every day | ORAL | 2 refills | Status: DC

## 2019-10-29 MED ORDER — METFORMIN HCL 1000 MG PO TABS: ORAL_TABLET | ORAL | 3 refills | Status: DC

## 2019-10-29 NOTE — Progress Notes (Signed)
Virtual Visit via Video Note  I connected with Brittany Taylor on 10/29/19 at  2:00 PM EST by a video enabled telemedicine application 2/2 [id number] pandemic and verified that I am speaking with the correct person using two identifiers.  Location patient: home Location provider:work or home office Persons participating in the virtual visit: patient, provider  I discussed the limitations of evaluation and management by telemedicine and the availability of in person appointments. The patient expressed understanding and agreed to proceed.   HPI: Pt needs refills on prozac, metfromin, and triameterene.  Pt states she has been doing ok.  Pt endorses loosing her dog, had a few deaths in the family, lost her job, and Scientist, product/process development.  Pt has been walking for exercise and cooking at home.  Pt has not checked her bp this wk.  Pt denies HAs, blurred vision, and CP.    Pt attempted to check bp during visit, but neither of her two machines are working.  ROS: See pertinent positives and negatives per HPI.  Past Medical History:  Diagnosis Date  . Allergy-induced asthma    daily inhaler  . Anxiety   . Arthritis    hands  . Dental crown present   . Depression   . GERD (gastroesophageal reflux disease)   . High cholesterol   . Hypertension    states under control with med., has been on med. x 10 yr.  . Non-insulin dependent type 2 diabetes mellitus ([place])   . PONV (postoperative nausea and vomiting)    patient thinks related to Morphine  . Psoriasis    arms, bilateral leg, back, breast, neck, ears  . Stenosing tenosynovitis of finger of right hand 03/2015   right ring finger    Past Surgical History:  Procedure Laterality Date  . BREAST REDUCTION SURGERY  2000  . CYSTOSCOPY N/A 03/23/2018   Procedure: CYSTOSCOPY;  Surgeon: Delsa Bern, MD;  Location: [facility];  Service: Gynecology;  Laterality: N/A;  . LEEP  03/18/2003  . MYOMECTOMY  1995  . POLYPECTOMY  01/2015   uterine  . SUPRACERVICAL  ABDOMINAL HYSTERECTOMY Bilateral 03/23/2018   Procedure: HYSTERECTOMY SUPRACERVICAL ABDOMINAL;  Surgeon: Delsa Bern, MD;  Location: [facility];  Service: Gynecology;  Laterality: Bilateral;  . TRIGGER FINGER RELEASE Left 06/15/2009   thumb  . TRIGGER FINGER RELEASE Right 11/15/2008   thumb  . TRIGGER FINGER RELEASE Right 04/13/2015   Procedure: RELEASE A-1 PULLEY RIGHT RING FINGER;  Surgeon: Daryll Brod, MD;  Location: [facility];  Service: Orthopedics;  Laterality: Right;  . WISDOM TOOTH EXTRACTION  1972    No family history on file.   Current Outpatient Medications:  .  aspirin 81 MG tablet, Take 162 mg by mouth daily. , Disp: , Rfl:  .  azelastine (OPTIVAR) 0.05 % ophthalmic solution, Place 1 drop into both eyes 2 (two) times daily as needed (for itchy/allergy eyes). , Disp: , Rfl:  .  budesonide-formoterol (SYMBICORT) 160-4.5 MCG/ACT inhaler, Inhale 1-2 puffs into the lungs 2 (two) times daily., Disp: 1 Inhaler, Rfl: 3 .  docusate sodium (COLACE) 100 MG capsule, Take 1 capsule (100 mg total) by mouth 2 (two) times daily., Disp: 10 capsule, Rfl: 0 .  EPINEPHrine 0.3 mg/0.3 mL IJ SOAJ injection, Inject 0.3 mg into the muscle daily as needed for anaphylaxis., Disp: , Rfl: 0 .  Estradiol (VAGIFEM) 10 MCG TABS vaginal tablet, Place 10 mcg vaginally 2 (two) times a week., Disp: , Rfl:  .  FLUoxetine (PROZAC)  20 MG capsule, Take 2 capsules (40 mg total) by mouth daily., Disp: 180 capsule, Rfl: 2 .  fluticasone (FLONASE) 50 MCG/ACT nasal spray, Place 2 sprays into both nostrils daily as needed for allergies., Disp: , Rfl:  .  glucose blood (ONETOUCH VERIO) test strip, 1 each by Other route daily. And lancets 1/day 250.00, Disp: 100 each, Rfl: 12 .  guanFACINE (TENEX) 1 MG tablet, Take 0.5 tablets (0.5 mg total) by mouth daily. (Patient taking differently: Take 0.5 mg by mouth at bedtime. ), Disp: 45 tablet, Rfl: 3 .  hydrOXYzine (ATARAX/VISTARIL) 10 MG tablet, Take 1 tablet (10 mg  total) by mouth every 6 (six) hours as needed for itching., Disp: 30 tablet, Rfl: 0 .  ibuprofen (ADVIL,MOTRIN) 600 MG tablet, 1  po  pc every 6 hours with food, for 5 days then as needed for pain, Disp: 30 tablet, Rfl: 1 .  loratadine (CLARITIN) 10 MG tablet, Take 10 mg by mouth daily., Disp: , Rfl:  .  metFORMIN (GLUCOPHAGE) 1000 MG tablet, TAKE 1 TABLET BY MOUTH TWICE A DAY WITH A MEAL , Disp: 180 tablet, Rfl: 0 .  montelukast (SINGULAIR) 10 MG tablet, Take 10 mg by mouth at bedtime., Disp: , Rfl: 0 .  NON FORMULARY, Inject into the skin once a week. Allergy shots, Disp: , Rfl:  .  simethicone (MYLICON) 80 MG chewable tablet, Chew 1 tablet (80 mg total) by mouth 4 (four) times daily as needed for flatulence., Disp: 30 tablet, Rfl: 0 .  STELARA 45 MG/0.5ML SOSY injection, INJECT THE CONTENTS OF 1 SYRINGE INTO THE SKIN NOW, THEN IN 1 MONTH INJECT THE CONTENTS OF 1 SYRINGE, THEN INJECT EVERY 3 MONTHS, Disp: , Rfl: 5 .  triamcinolone ointment (KENALOG) 0.1 %, Apply 1 application topically 2 (two) times daily as needed (for psoriasis)., Disp: 30 g, Rfl: 3 .  triamterene-hydrochlorothiazide (MAXZIDE-25) 37.5-25 MG tablet, TAKE ONE TABLET BY MOUTH ONE TIME DAILY , Disp: 90 tablet, Rfl: 0 .  VENTOLIN HFA 108 (90 Base) MCG/ACT inhaler, Inhale 1 puff into the lungs every 6 (six) hours as needed for wheezing or shortness of breath., Disp: , Rfl: 0  EXAM:  VITALS per patient if applicable:  RR between 12-20 bpm  GENERAL: alert, oriented, appears well and in no acute distress  HEENT: atraumatic, conjunctiva clear, no obvious abnormalities on inspection of external nose and ears  NECK: normal movements of the head and neck  LUNGS: on inspection no signs of respiratory distress, breathing rate appears normal, no obvious gross SOB, gasping or wheezing  CV: no obvious cyanosis  MS: moves all visible extremities without noticeable abnormality  PSYCH/NEURO: pleasant and cooperative, no obvious depression  or anxiety, speech and thought processing grossly intact  ASSESSMENT AND PLAN:  Discussed the following assessment and plan:  Depression, recurrent (HCC)  -stable -discussed ways to reduce stress. Encouraged to take one day at a time. - Plan: FLUoxetine (PROZAC) 20 MG capsule  Type 2 diabetes mellitus without complication, without long-term current use of insulin ([place])  -continue lifestyle modifications - Plan: metFORMIN (GLUCOPHAGE) 1000 MG tablet, Hemoglobin A1c  Essential hypertension  -continue lifestyle modifications. - Plan: triamterene-hydrochlorothiazide (MAXZIDE-25) 37.5-25 MG tablet, Basic Metabolic Panel  Discussed obtaining labs when pt is able.  Pt plans to contact office in regards to cost.  F/u prn in the next 3 months, sooner if needed.   I discussed the assessment and treatment plan with the patient. The patient was provided an opportunity to ask  questions and all were answered. The patient agreed with the plan and demonstrated an understanding of the instructions.   The patient was advised to call back or seek an in-person evaluation if the symptoms worsen or if the condition fails to improve as anticipated.   Billie Ruddy, MD   This note is not being shared with the patient for the following reason: To respect privacy (The patient or proxy has requested that the information not be shared).

## 2020-04-28 LAB — HM DIABETES EYE EXAM

## 2020-05-02 ENCOUNTER — Telehealth: Payer: Self-pay

## 2020-05-02 NOTE — Telephone Encounter (Signed)
Telephoned patient at home number. Left voice message with [facility] contact information. Called mobile number, mail box not set up at this time.

## 2020-05-10 ENCOUNTER — Other Ambulatory Visit: Payer: Self-pay

## 2020-05-10 DIAGNOSIS — Z1231 Encounter for screening mammogram for malignant neoplasm of breast: Secondary | ICD-10-CM

## 2020-05-11 ENCOUNTER — Encounter: Payer: Self-pay | Admitting: Family Medicine

## 2020-06-15 ENCOUNTER — Ambulatory Visit: Payer: Self-pay | Admitting: *Deleted

## 2020-06-15 ENCOUNTER — Ambulatory Visit
Admission: RE | Admit: 2020-06-15 | Discharge: 2020-06-15 | Disposition: A | Discharge status: Home / Self Care | Payer: No Typology Code available for payment source | Source: Ambulatory Visit | Attending: Obstetrics and Gynecology | Admitting: Obstetrics and Gynecology

## 2020-06-15 ENCOUNTER — Ambulatory Visit: Payer: Managed Care, Other (non HMO)

## 2020-06-15 ENCOUNTER — Other Ambulatory Visit: Payer: Self-pay

## 2020-06-15 VITALS — BP 120/82 | Temp 97.1°F | Wt 192.3 lb

## 2020-06-15 DIAGNOSIS — Z1239 Encounter for other screening for malignant neoplasm of breast: Secondary | ICD-10-CM

## 2020-06-15 DIAGNOSIS — Z1231 Encounter for screening mammogram for malignant neoplasm of breast: Secondary | ICD-10-CM

## 2020-06-15 NOTE — Progress Notes (Signed)
Ms. Brittany Taylor is a 65 y.o. female who presents to [facility] clinic today with no complaints.    Pap Smear: Pap not smear completed today. Last Pap smear was in 2019 and was normal per patient. Per patient has history of an abnormal Pap smear 03/18/2003 that a LEEP was completed for follow-up. Patient stated all Pap smears have been normal since LEEP and has had at least three normal Pap smears. Patient has a history of a hysterectomy 03/23/2018 for fibroids. Patient stated she still has her cervix. Last Pap smear result is not available in Epic.   Physical exam: Breasts Breasts symmetrical. Scars bilateral lower breast due to history of breast reduction surgery over 20 years ago. No nipple retraction bilateral breasts. No nipple discharge bilateral breasts. No lymphadenopathy. No lumps palpated bilateral breasts. No complaints of pain or tenderness on exam.       Pelvic/Bimanual Pap is not indicated today per [facility] guidelines.   Smoking History: Patient has never smoked.   Patient Navigation: Patient education provided. Access to services provided for patient through [facility] program.   Colorectal Cancer Screening: Per patient had a colonoscopy completed over 10 years ago. No complaints today.    Breast and Cervical Cancer Risk Assessment: Patient does not have family history of breast cancer, known genetic mutations, or radiation treatment to the chest before age 82. Per patient has history of cervical dysplasia. Patient has no history of being immunocompromised or DES exposure in-utero.  Risk Assessment    Risk Scores      06/15/2020   Last edited by: Demetrius Revel, LPN   5-year risk: 1.6 %   Lifetime risk: 6.3 %          A: [facility] exam without pap smear No complaints.  P: Referred patient to the [facility] for a screening mammogram on the mobile unit. Appointment scheduled Thursday, June 15, 2020 at 1550.  Loletta Parish, RN 06/15/2020 4:34 PM

## 2020-06-15 NOTE — Patient Instructions (Signed)
Explained breast self awareness with Brittany Taylor. Patient did not need a Pap smear today due to last Pap smear was in 2019 per patient.  Let her know [facility] will cover Pap smears every 3 years unless has a history of abnormal Pap smears. Referred patient to the [facility] for a screening mammogram on the mobile unit. Appointment scheduled Thursday, June 15, 2020 at 1550. Patient escorted to mobile unit following [facility] appointment. Let patient know the [facility] will follow up with her within the next couple weeks with results of her mammogram by letter or phone. Brittany Taylor verbalized understanding.  Brittany Taylor, Arvil Chaco, RN 4:34 PM

## 2020-07-29 ENCOUNTER — Other Ambulatory Visit: Payer: Self-pay | Admitting: Family Medicine

## 2020-07-29 DIAGNOSIS — F339 Major depressive disorder, recurrent, unspecified: Secondary | ICD-10-CM

## 2020-08-01 IMAGING — MG DIGITAL SCREENING BILAT W/ TOMO W/ CAD
8 series · 8 of 24 positions shown · non-contrast
Comparison: Previous exam(s).

CLINICAL DATA: Screening.

EXAM:
DIGITAL SCREENING BILATERAL MAMMOGRAM WITH TOMO AND CAD

[L MLO synth-2D]
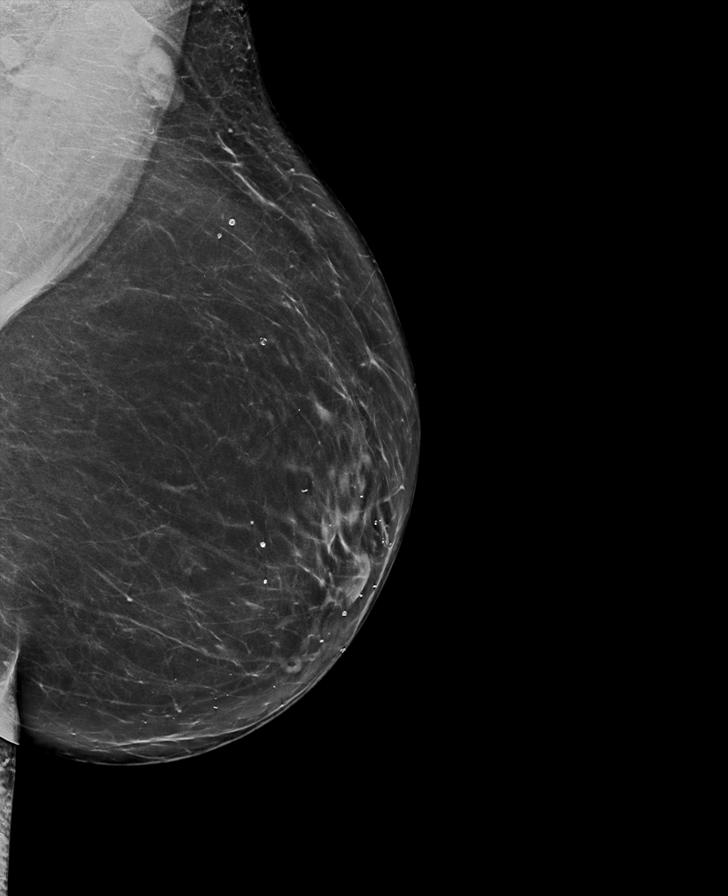

[R MLO synth-2D]
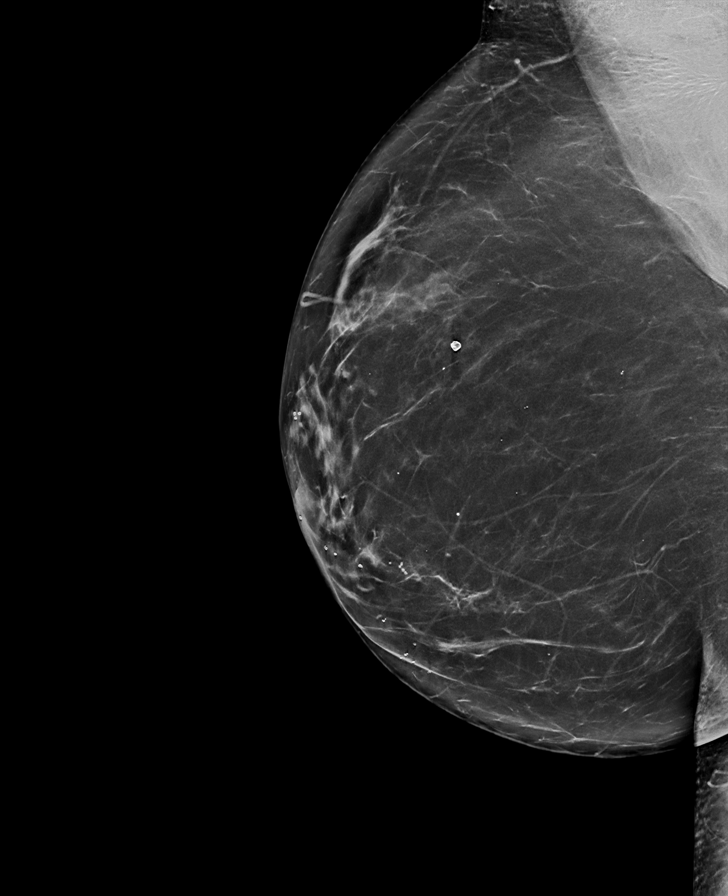

[R CC synth-2D]
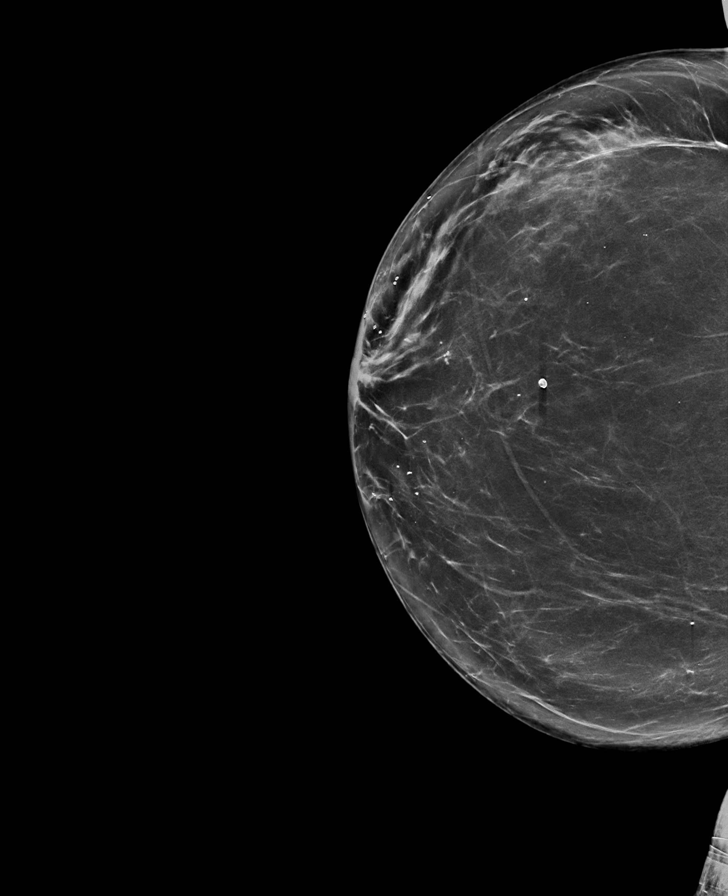

[L CC synth-2D]
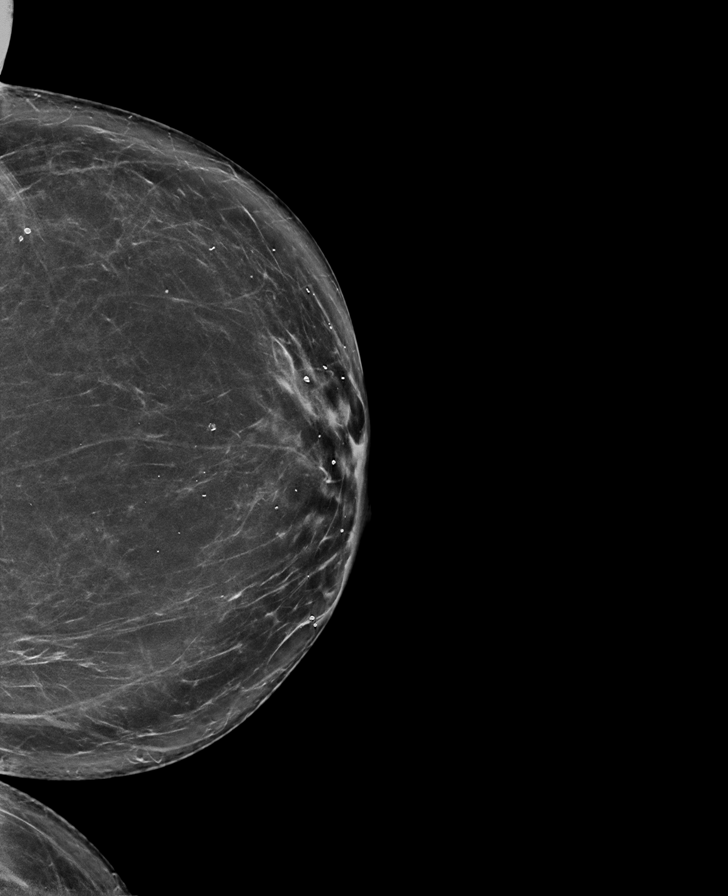

[R CC tomo · tomo slice 41/81.0]
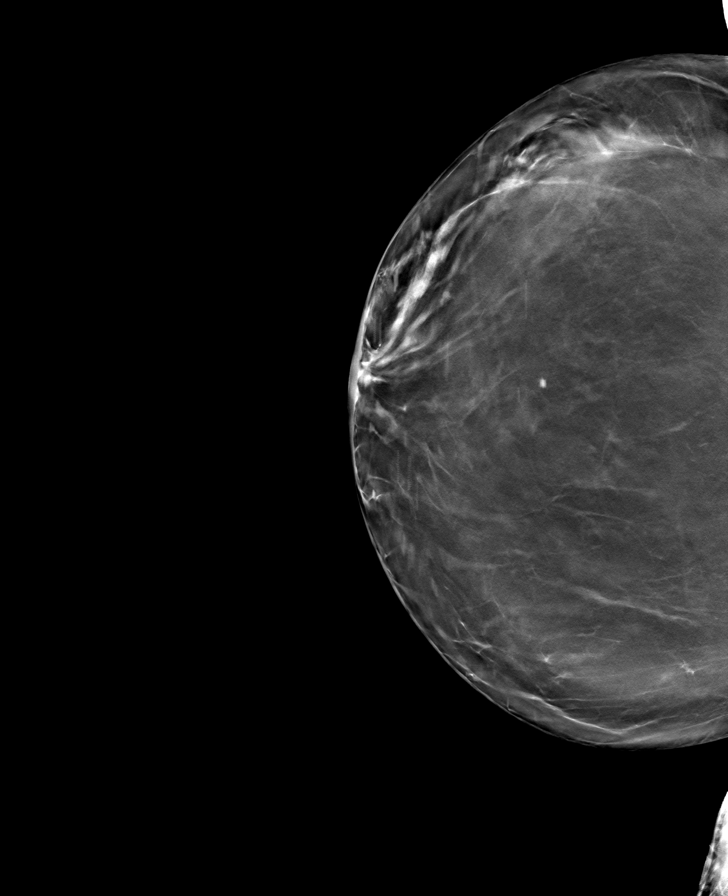

[L MLO tomo · tomo slice 43/86.0]
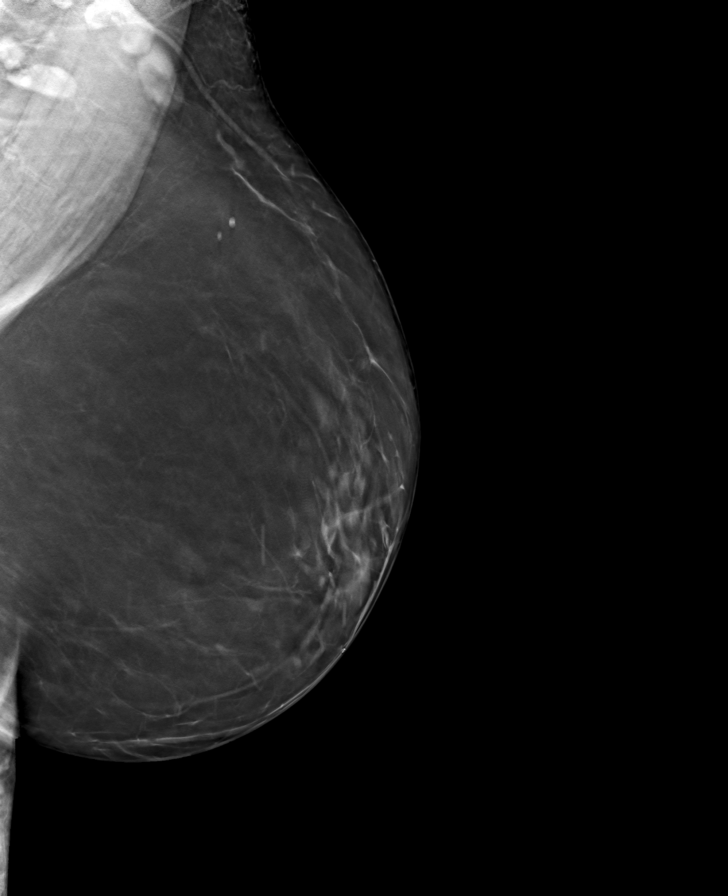

[R MLO tomo · tomo slice 41/82.0]
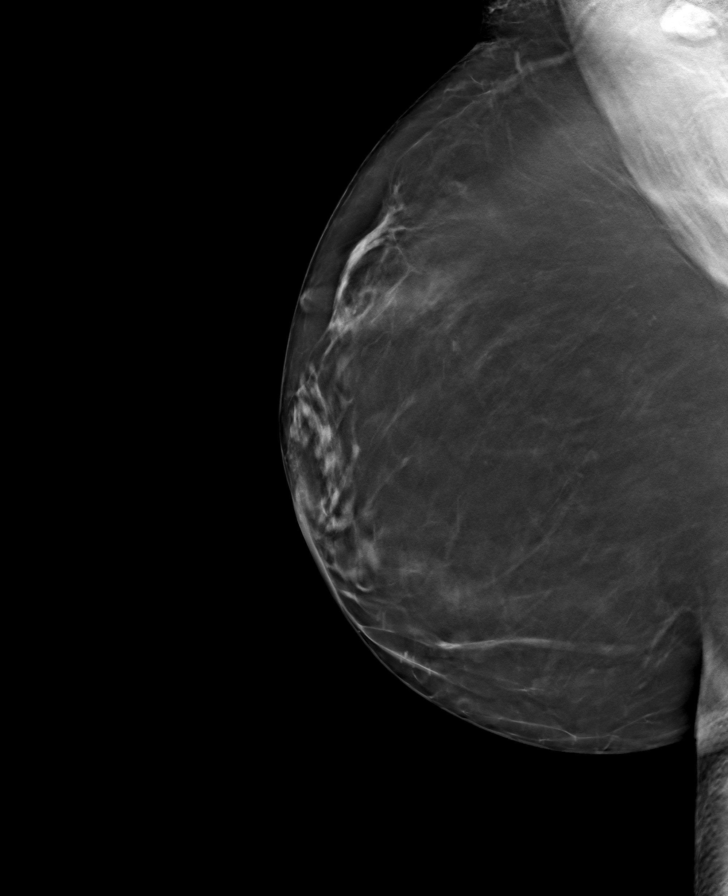

[L CC tomo · tomo slice 40/79.0]
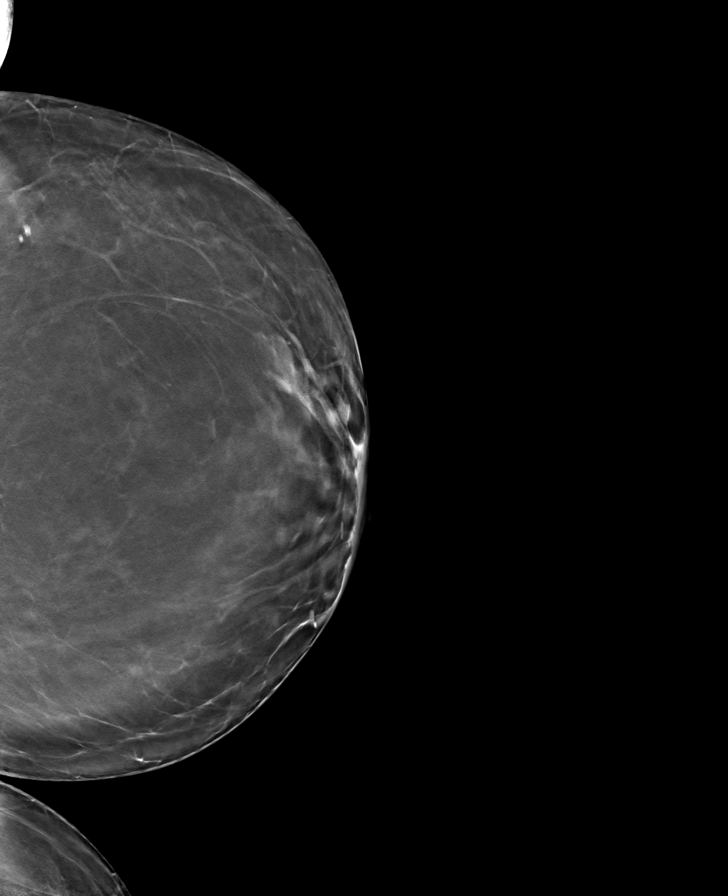

[8 of 24 positions shown; findings below may reference images not displayed]

ACR Breast Density Category b: There are scattered areas of
fibroglandular density.
FINDINGS: There are no findings suspicious for malignancy. Images were
processed with CAD.
IMPRESSION: No mammographic evidence of malignancy. A result letter of this
screening mammogram will be mailed directly to the patient.

RECOMMENDATION:
Screening mammogram in one year. (Code:[id number])

BI-RADS CATEGORY  1: Negative.

## 2020-08-02 NOTE — Telephone Encounter (Signed)
Returned pt call not able to leave a message, mailbox is full

## 2020-08-02 NOTE — Telephone Encounter (Signed)
Attempted to call pt but mail box is full, no option to leave message

## 2020-08-02 NOTE — Telephone Encounter (Signed)
Pt was returning [place] call. She needs a call back at [phone]

## 2020-08-07 NOTE — Telephone Encounter (Signed)
Pt is returning a call back. She is still asking for her medication   FLUoxetine (PROZAC) 20 MG capsule  Please advise

## 2020-08-08 NOTE — Telephone Encounter (Signed)
Pt is scheduled for  MyChart Visit on 08/16/2020 at 4 pm for med refill, Sent enough to last pt to her appointment day due to Dr Volanda Napoleon not being available

## 2020-08-16 ENCOUNTER — Telehealth (INDEPENDENT_AMBULATORY_CARE_PROVIDER_SITE_OTHER): Payer: Self-pay | Admitting: Family Medicine

## 2020-08-16 ENCOUNTER — Encounter: Payer: Self-pay | Admitting: Family Medicine

## 2020-08-16 DIAGNOSIS — F339 Major depressive disorder, recurrent, unspecified: Secondary | ICD-10-CM

## 2020-08-16 MED ORDER — FLUOXETINE HCL 20 MG PO CAPS: 40.0000 mg | ORAL_CAPSULE | Freq: Every day | ORAL | 3 refills | Status: DC

## 2020-08-16 NOTE — Progress Notes (Signed)
Virtual Visit via Telephone Note  I connected with Brittany Taylor on 08/16/20 at  4:00 PM EDT by telephone and verified that I am speaking with the correct person using two identifiers.   I discussed the limitations, risks, security and privacy concerns of performing an evaluation and management service by telephone and the availability of in person appointments. I also discussed with the patient that there may be a patient responsible charge related to this service. The patient expressed understanding and agreed to proceed.  Location patient: home Location provider: work or home office Participants present for the call: patient, provider Patient did not have a visit in the prior 7 days to address this/these issue(s).   History of Present Illness: Pt is a 64 yo female with pmh sig for HTN, GERD, DM II  feeling better, not as depressed.  Tries to get out of the house more.  Taking Prozac 40 mg.  Pt notes some anxiety.  States needs to work on sleep hygiene.  Walking for exercise 30 min qod or rides stationary bike    Observations/Objective: Patient sounds cheerful and well on the phone. I do not appreciate any SOB. Speech and thought processing are grossly intact. Patient reported vitals:  Assessment and Plan:  Depression, recurrent (HCC) -GAD-7 score 7 -PHQ-9 score 5 -Continue Prozac 40 mg daily -Patient encouraged to consider counseling and practice self-care - Plan: FLUoxetine (PROZAC) 20 MG capsule    Follow Up Instructions:  F/u in the next month   I did not refer this patient for an OV in the next 24 hours for this/these issue(s).  I discussed the assessment and treatment plan with the patient. The patient was provided an opportunity to ask questions and all were answered. The patient agreed with the plan and demonstrated an understanding of the instructions.   The patient was advised to call back or seek an in-person evaluation if the symptoms worsen or if the  condition fails to improve as anticipated.  I provided 15 minutes of non-face-to-face time during this encounter.   Billie Ruddy, MD

## 2020-08-24 ENCOUNTER — Encounter: Payer: Self-pay | Admitting: Family Medicine

## 2020-10-11 ENCOUNTER — Encounter: Payer: Self-pay | Admitting: Internal Medicine

## 2020-10-11 ENCOUNTER — Telehealth (INDEPENDENT_AMBULATORY_CARE_PROVIDER_SITE_OTHER): Payer: Managed Care, Other (non HMO) | Admitting: Internal Medicine

## 2020-10-11 VITALS — BP 127/92 | Ht 64.0 in | Wt 186.0 lb

## 2020-10-11 DIAGNOSIS — J45909 Unspecified asthma, uncomplicated: Secondary | ICD-10-CM

## 2020-10-11 DIAGNOSIS — J3089 Other allergic rhinitis: Secondary | ICD-10-CM

## 2020-10-11 DIAGNOSIS — J069 Acute upper respiratory infection, unspecified: Secondary | ICD-10-CM

## 2020-10-11 MED ORDER — DOXYCYCLINE HYCLATE 100 MG PO TABS
100.0000 mg | ORAL_TABLET | Freq: Two times a day (BID) | ORAL | 0 refills | Status: DC
Start: 2020-10-11 — End: 2021-01-15

## 2020-10-11 MED ORDER — BENZONATATE 100 MG PO CAPS
100.0000 mg | ORAL_CAPSULE | Freq: Three times a day (TID) | ORAL | 0 refills | Status: DC | PRN
Start: 2020-10-11 — End: 2021-04-11

## 2020-10-11 NOTE — Progress Notes (Signed)
Virtual Visit via Video Note  I connected with@ on 10/11/20 at  3:30 PM EST by a video enabled telemedicine application and verified that I am speaking with the correct person using two identifiers. Location patient: home Location provider:workoffice Persons participating in the virtual visit: patient, provider  [place] national recommendations  regarding COVID 19 pandemic   video visit is advised over in office visit for this patient.  Patient aware  of the limitations of evaluation and management by telemedicine and  availability of in person appointments. and agreed to proceed.   HPI: Brittany Taylor presents for video visit sda  PCP appt NA  Onset about 10 days ago or more of stuffy nose which he is Claritin-D 24 hours which improved and then 5 days ago began having a cough that with spasms and difficulty with cough recently coughing up yellow phlegm without blood fever chills or shortness of breath she feels like a tickle in her throat with postnasal drainage and head congestion. Has underlying asthma allergy is on Advair generic has been using albuterol.  Household of 1 has been Covid vaccinated including booster a couple of months ago.   ROS: See pertinent positives and negatives per HPI.  Past Medical History:  Diagnosis Date  . Allergy-induced asthma    daily inhaler  . Anxiety   . Arthritis    hands  . Dental crown present   . Depression   . GERD (gastroesophageal reflux disease)   . High cholesterol   . Hypertension    states under control with med., has been on med. x 10 yr.  . Non-insulin dependent type 2 diabetes mellitus ([place])   . PONV (postoperative nausea and vomiting)    patient thinks related to Morphine  . Psoriasis    arms, bilateral leg, back, breast, neck, ears  . Stenosing tenosynovitis of finger of right hand 03/2015   right ring finger    Past Surgical History:  Procedure Laterality Date  . BREAST REDUCTION SURGERY  2000  . CYSTOSCOPY N/A 03/23/2018    Procedure: CYSTOSCOPY;  Surgeon: Delsa Bern, MD;  Location: [facility];  Service: Gynecology;  Laterality: N/A;  . LEEP  03/18/2003  . MYOMECTOMY  1995  . POLYPECTOMY  01/2015   uterine  . REDUCTION MAMMAPLASTY     2000  . SUPRACERVICAL ABDOMINAL HYSTERECTOMY Bilateral 03/23/2018   Procedure: HYSTERECTOMY SUPRACERVICAL ABDOMINAL;  Surgeon: Delsa Bern, MD;  Location: [facility];  Service: Gynecology;  Laterality: Bilateral;  . TRIGGER FINGER RELEASE Left 06/15/2009   thumb  . TRIGGER FINGER RELEASE Right 11/15/2008   thumb  . TRIGGER FINGER RELEASE Right 04/13/2015   Procedure: RELEASE A-1 PULLEY RIGHT RING FINGER;  Surgeon: Daryll Brod, MD;  Location: [facility];  Service: Orthopedics;  Laterality: Right;  . WISDOM TOOTH EXTRACTION  1972    Family History  Problem Relation Age of Onset  . Diabetes Mother   . Diabetes Father   . Stroke Father   . Hypertension Father   . Glaucoma Father     Social History   Tobacco Use  . Smoking status: Never Smoker  . Smokeless tobacco: Never Used  Vaping Use  . Vaping Use: Never used  Substance Use Topics  . Alcohol use: Yes    Comment: occasionally  . Drug use: No      Current Outpatient Medications:  .  aspirin 81 MG tablet, Take 162 mg by mouth daily. , Disp: , Rfl:  .  azelastine (OPTIVAR)  0.05 % ophthalmic solution, Place 1 drop into both eyes 2 (two) times daily as needed (for itchy/allergy eyes). , Disp: , Rfl:  .  docusate sodium (COLACE) 100 MG capsule, Take 1 capsule (100 mg total) by mouth 2 (two) times daily., Disp: 10 capsule, Rfl: 0 .  FLUoxetine (PROZAC) 20 MG capsule, Take 2 capsules (40 mg total) by mouth daily., Disp: 180 capsule, Rfl: 3 .  fluticasone (FLONASE) 50 MCG/ACT nasal spray, Place 2 sprays into both nostrils daily as needed for allergies., Disp: , Rfl:  .  glucose blood (ONETOUCH VERIO) test strip, 1 each by Other route daily. And lancets 1/day 250.00, Disp: 100 each, Rfl: 12 .  ibuprofen  (ADVIL,MOTRIN) 600 MG tablet, 1  po  pc every 6 hours with food, for 5 days then as needed for pain, Disp: 30 tablet, Rfl: 1 .  loratadine (CLARITIN) 10 MG tablet, Take 10 mg by mouth daily., Disp: , Rfl:  .  metFORMIN (GLUCOPHAGE) 1000 MG tablet, TAKE 1 TABLET BY MOUTH TWICE A DAY WITH A MEAL, Disp: 180 tablet, Rfl: 3 .  montelukast (SINGULAIR) 10 MG tablet, Take 10 mg by mouth at bedtime., Disp: , Rfl: 0 .  Risankizumab-rzaa (SKYRIZI [place]), [employer], Disp: , Rfl:  .  simethicone (MYLICON) 80 MG chewable tablet, Chew 1 tablet (80 mg total) by mouth 4 (four) times daily as needed for flatulence., Disp: 30 tablet, Rfl: 0 .  triamcinolone ointment (KENALOG) 0.1 %, Apply 1 application topically 2 (two) times daily as needed (for psoriasis)., Disp: 30 g, Rfl: 3 .  triamterene-hydrochlorothiazide (MAXZIDE-25) 37.5-25 MG tablet, Take 1 tablet by mouth daily., Disp: 90 tablet, Rfl: 2 .  VENTOLIN HFA 108 (90 Base) MCG/ACT inhaler, Inhale 1 puff into the lungs every 6 (six) hours as needed for wheezing or shortness of breath., Disp: , Rfl: 0 .  benzonatate (TESSALON) 100 MG capsule, Take 1 capsule (100 mg total) by mouth 3 (three) times daily as needed for cough., Disp: 24 capsule, Rfl: 0 .  budesonide-formoterol (SYMBICORT) 160-4.5 MCG/ACT inhaler, Inhale 1-2 puffs into the lungs 2 (two) times daily. (Patient not taking: Reported on 10/11/2020), Disp: 1 Inhaler, Rfl: 3 .  doxycycline (VIBRA-TABS) 100 MG tablet, Take 1 tablet (100 mg total) by mouth 2 (two) times daily., Disp: 14 tablet, Rfl: 0 .  EPINEPHrine 0.3 mg/0.3 mL IJ SOAJ injection, Inject 0.3 mg into the muscle daily as needed for anaphylaxis. (Patient not taking: Reported on 10/11/2020), Disp: , Rfl: 0 .  Estradiol (VAGIFEM) 10 MCG TABS vaginal tablet, Place 10 mcg vaginally 2 (two) times a week. (Patient not taking: Reported on 10/11/2020), Disp: , Rfl:  .  guanFACINE (TENEX) 1 MG tablet, Take 0.5 tablets (0.5 mg total) by mouth daily. (Patient not  taking: Reported on 10/11/2020), Disp: 45 tablet, Rfl: 3 .  hydrOXYzine (ATARAX/VISTARIL) 10 MG tablet, Take 1 tablet (10 mg total) by mouth every 6 (six) hours as needed for itching. (Patient not taking: Reported on 10/11/2020), Disp: 30 tablet, Rfl: 0 .  NON FORMULARY, Inject into the skin once a week. Allergy shots (Patient not taking: Reported on 10/11/2020), Disp: , Rfl:   EXAM: BP Readings from Last 3 Encounters:  10/11/20 (!) 127/92  06/15/20 120/82  04/02/19 112/60    VITALS per patient if applicable:  GENERAL: alert, oriented, appears well and in no acute distress occasional coughing fits but good color and speech nontoxic sounds a bit congested  HEENT: atraumatic, conjunttiva clear, no obvious abnormalities on inspection  of external nose and ears  NECK: normal movements of the head and neck  LUNGS: on inspection no signs of respiratory distress, breathing rate appears normal, no obvious gross SOB, gasping or wheezing  CV: no obvious cyanosis  MS: moves all visible extremities without noticeable abnormality  PSYCH/NEURO: pleasant and cooperative, no obvious depression or anxiety, speech and thought processing grossly intact   ASSESSMENT AND PLAN:  Discussed the following assessment and plan:    ICD-10-CM   1. Viral upper respiratory tract infection with cough  J06.9   2. Seasonal allergic rhinitis due to other allergic trigger  J30.89   3. Extrinsic asthma without complication, unspecified asthma severity, unspecified whether persistent  J45.909     prolonged respiratory symptoms that now sound infection over 10 days with relapsing symptoms and severe cough with underlying allergy asthmatic component. She has had Covid immunization and booster but would still advise getting rapid or Covid testing.  As would advise intervention if positive.  Continue her Advair generic inhaler pulmonary hygiene albuterol rescue Send in Tessalon Perles Empiric antibiotic doxycycline 1  p.o. twice daily x7 days for possible secondary infection sinusitis triggering lower symptoms. Counseled.  Help for alarm symptoms shortness of breath decompensation.  Expectant management and discussion of plan and treatment with opportunity to ask questions and all were answered. The patient agreed with the plan and demonstrated an understanding of the instructions.   Advised to call back or seek an in-person evaluation if worsening  or having  further concerns . No follow-ups on file.    Shanon Ace, MD

## 2020-10-11 NOTE — Progress Notes (Signed)
Patient presenting with productive cough with yellow mucous, congestion, and drainage. Onset Last Friday.   No one around the Patient is sick.. 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

## 2020-10-30 ENCOUNTER — Encounter: Payer: Self-pay | Admitting: Family Medicine

## 2020-11-08 ENCOUNTER — Other Ambulatory Visit: Payer: Self-pay | Admitting: Family Medicine

## 2020-11-08 DIAGNOSIS — I10 Essential (primary) hypertension: Secondary | ICD-10-CM

## 2020-11-15 ENCOUNTER — Other Ambulatory Visit: Payer: Self-pay

## 2020-11-15 ENCOUNTER — Encounter: Payer: Self-pay | Admitting: Family Medicine

## 2020-11-15 ENCOUNTER — Ambulatory Visit (INDEPENDENT_AMBULATORY_CARE_PROVIDER_SITE_OTHER): Payer: Managed Care, Other (non HMO) | Admitting: Family Medicine

## 2020-11-15 VITALS — BP 122/76 | HR 93 | Temp 97.7°F | Wt 185.6 lb

## 2020-11-15 DIAGNOSIS — E1169 Type 2 diabetes mellitus with other specified complication: Secondary | ICD-10-CM

## 2020-11-15 DIAGNOSIS — L409 Psoriasis, unspecified: Secondary | ICD-10-CM

## 2020-11-15 DIAGNOSIS — Z1211 Encounter for screening for malignant neoplasm of colon: Secondary | ICD-10-CM

## 2020-11-15 DIAGNOSIS — I10 Essential (primary) hypertension: Secondary | ICD-10-CM

## 2020-11-15 DIAGNOSIS — G629 Polyneuropathy, unspecified: Secondary | ICD-10-CM | POA: Diagnosis not present

## 2020-11-15 DIAGNOSIS — Z0000 Encounter for general adult medical examination without abnormal findings: Secondary | ICD-10-CM

## 2020-11-15 LAB — CBC WITH DIFFERENTIAL/PLATELET
Basophils Absolute: 0 10*3/uL (ref 0.0–0.1)
Basophils Relative: 0.8 % (ref 0.0–3.0)
Eosinophils Absolute: 0.1 10*3/uL (ref 0.0–0.7)
Eosinophils Relative: 3.1 % (ref 0.0–5.0)
HCT: 36.6 % (ref 36.0–46.0)
Hemoglobin: 12.1 g/dL (ref 12.0–15.0)
Lymphocytes Relative: 30.1 % (ref 12.0–46.0)
Lymphs Abs: 0.9 10*3/uL (ref 0.7–4.0)
MCHC: 33.1 g/dL (ref 30.0–36.0)
MCV: 89.2 fl (ref 78.0–100.0)
Monocytes Absolute: 0.3 10*3/uL (ref 0.1–1.0)
Monocytes Relative: 10.8 % (ref 3.0–12.0)
Neutro Abs: 1.6 10*3/uL (ref 1.4–7.7)
Neutrophils Relative %: 55.2 % (ref 43.0–77.0)
Platelets: 309 10*3/uL (ref 150.0–400.0)
RBC: 4.11 Mil/uL (ref 3.87–5.11)
RDW: 15.5 % (ref 11.5–15.5)
WBC: 2.9 10*3/uL — ABNORMAL LOW (ref 4.0–10.5)

## 2020-11-15 LAB — LIPID PANEL
Cholesterol: 232 mg/dL — ABNORMAL HIGH (ref 0–200)
HDL: 70.6 mg/dL (ref >39.00)
LDL Cholesterol: 136 mg/dL — ABNORMAL HIGH (ref 0–99)
NonHDL: 161.85
Total CHOL/HDL Ratio: 3
Triglycerides: 127 mg/dL (ref 0.0–149.0)
VLDL: 25.4 mg/dL (ref 0.0–40.0)

## 2020-11-15 LAB — BASIC METABOLIC PANEL
BUN: 15 mg/dL (ref 6–23)
CO2: 29 mEq/L (ref 19–32)
Calcium: 9.6 mg/dL (ref 8.4–10.5)
Chloride: 97 mEq/L (ref 96–112)
Creatinine, Ser: 0.75 mg/dL (ref 0.40–1.20)
GFR: 84.23 mL/min (ref >60.00)
Glucose, Bld: 145 mg/dL — ABNORMAL HIGH (ref 70–99)
Potassium: 3.8 mEq/L (ref 3.5–5.1)
Sodium: 136 mEq/L (ref 135–145)

## 2020-11-15 LAB — VITAMIN B12: Vitamin B-12: 590 pg/mL (ref 211–911)

## 2020-11-15 LAB — HEMOGLOBIN A1C: Hgb A1c MFr Bld: 7.8 % — ABNORMAL HIGH (ref 4.6–6.5)

## 2020-11-15 LAB — FOLATE: Folate: >23.6 ng/mL (ref >5.9)

## 2020-11-15 LAB — TSH: TSH: 1.4 u[IU]/mL (ref 0.35–4.50)

## 2020-11-15 NOTE — Patient Instructions (Signed)
Preventive Care 40-64 Years Old, Female Preventive care refers to visits with your health care provider and lifestyle choices that can promote health and wellness. This includes:  A yearly physical exam. This may also be called an annual well check.  Regular dental visits and eye exams.  Immunizations.  Screening for certain conditions.  Healthy lifestyle choices, such as eating a healthy diet, getting regular exercise, not using drugs or products that contain nicotine and tobacco, and limiting alcohol use. What can I expect for my preventive care visit? Physical exam Your health care provider will check your:  Height and weight. This may be used to calculate body mass index (BMI), which tells if you are at a healthy weight.  Heart rate and blood pressure.  Skin for abnormal spots. Counseling Your health care provider may ask you questions about your:  Alcohol, tobacco, and drug use.  Emotional well-being.  Home and relationship well-being.  Sexual activity.  Eating habits.  Work and work environment.  Method of birth control.  Menstrual cycle.  Pregnancy history. What immunizations do I need?  Influenza (flu) vaccine  This is recommended every year. Tetanus, diphtheria, and pertussis (Tdap) vaccine  You may need a Td booster every 10 years. Varicella (chickenpox) vaccine  You may need this if you have not been vaccinated. Zoster (shingles) vaccine  You may need this after age 60. Measles, mumps, and rubella (MMR) vaccine  You may need at least one dose of MMR if you were born in 1957 or later. You may also need a second dose. Pneumococcal conjugate (PCV13) vaccine  You may need this if you have certain conditions and were not previously vaccinated. Pneumococcal polysaccharide (PPSV23) vaccine  You may need one or two doses if you smoke cigarettes or if you have certain conditions. Meningococcal conjugate (MenACWY) vaccine  You may need this if you  have certain conditions. Hepatitis A vaccine  You may need this if you have certain conditions or if you travel or work in places where you may be exposed to hepatitis A. Hepatitis B vaccine  You may need this if you have certain conditions or if you travel or work in places where you may be exposed to hepatitis B. Haemophilus influenzae type b (Hib) vaccine  You may need this if you have certain conditions. Human papillomavirus (HPV) vaccine  If recommended by your health care provider, you may need three doses over 6 months. You may receive vaccines as individual doses or as more than one vaccine together in one shot (combination vaccines). Talk with your health care provider about the risks and benefits of combination vaccines. What tests do I need? Blood tests  Lipid and cholesterol levels. These may be checked every 5 years, or more frequently if you are over 50 years old.  Hepatitis C test.  Hepatitis B test. Screening  Lung cancer screening. You may have this screening every year starting at age 55 if you have a 30-pack-year history of smoking and currently smoke or have quit within the past 15 years.  Colorectal cancer screening. All adults should have this screening starting at age 50 and continuing until age 75. Your health care provider may recommend screening at age 45 if you are at increased risk. You will have tests every 1-10 years, depending on your results and the type of screening test.  Diabetes screening. This is done by checking your blood sugar (glucose) after you have not eaten for a while (fasting). You may have this   done every 1-3 years.  Mammogram. This may be done every 1-2 years. Talk with your health care provider about when you should start having regular mammograms. This may depend on whether you have a family history of breast cancer.  BRCA-related cancer screening. This may be done if you have a family history of breast, ovarian, tubal, or peritoneal  cancers.  Pelvic exam and Pap test. This may be done every 3 years starting at age 16. Starting at age 45, this may be done every 5 years if you have a Pap test in combination with an HPV test. Other tests  Sexually transmitted disease (STD) testing.  Bone density scan. This is done to screen for osteoporosis. You may have this scan if you are at high risk for osteoporosis. Follow these instructions at home: Eating and drinking  Eat a diet that includes fresh fruits and vegetables, whole grains, lean protein, and low-fat dairy.  Take vitamin and mineral supplements as recommended by your health care provider.  Do not drink alcohol if: ? Your health care provider tells you not to drink. ? You are pregnant, may be pregnant, or are planning to become pregnant.  If you drink alcohol: ? Limit how much you have to 0-1 drink a day. ? Be aware of how much alcohol is in your drink. In the [place] one drink equals one 12 oz bottle of beer (355 mL), one 5 oz glass of wine (148 mL), or one 1 oz glass of hard liquor (44 mL). Lifestyle  Take daily care of your teeth and gums.  Stay active. Exercise for at least 30 minutes on 5 or more days each week.  Do not use any products that contain nicotine or tobacco, such as cigarettes, e-cigarettes, and chewing tobacco. If you need help quitting, ask your health care provider.  If you are sexually active, practice safe sex. Use a condom or other form of birth control (contraception) in order to prevent pregnancy and STIs (sexually transmitted infections).  If told by your health care provider, take low-dose aspirin daily starting at age 13. What's next?  Visit your health care provider once a year for a well check visit.  Ask your health care provider how often you should have your eyes and teeth checked.  Stay up to date on all vaccines. This information is not intended to replace advice given to you by your health care provider. Make sure you  discuss any questions you have with your health care provider. Document Revised: 07/16/2018 Document Reviewed: 07/16/2018 Elsevier Patient Education  [place].  Peripheral Neuropathy Peripheral neuropathy is a type of nerve damage. It affects nerves that carry signals between the spinal cord and the arms, legs, and the rest of the body (peripheral nerves). It does not affect nerves in the spinal cord or brain. In peripheral neuropathy, one nerve or a group of nerves may be damaged. Peripheral neuropathy is a broad category that includes many specific nerve disorders, like diabetic neuropathy, hereditary neuropathy, and carpal tunnel syndrome. What are the causes? This condition may be caused by:  Diabetes. This is the most common cause of peripheral neuropathy.  Nerve injury.  Pressure or stress on a nerve that lasts a long time.  Lack (deficiency) of B vitamins. This can result from alcoholism, poor diet, or a restricted diet.  Infections.  Autoimmune diseases, such as rheumatoid arthritis and systemic lupus erythematosus.  Nerve diseases that are passed from parent to child (inherited).  Some medicines,  such as cancer medicines (chemotherapy).  Poisonous (toxic) substances, such as lead and mercury.  Too little blood flowing to the legs.  Kidney disease.  Thyroid disease. In some cases, the cause of this condition is not known. What are the signs or symptoms? Symptoms of this condition depend on which of your nerves is damaged. Common symptoms include:  Loss of feeling (numbness) in the feet, hands, or both.  Tingling in the feet, hands, or both.  Burning pain.  Very sensitive skin.  Weakness.  Not being able to move a part of the body (paralysis).  Muscle twitching.  Clumsiness or poor coordination.  Loss of balance.  Not being able to control your bladder.  Feeling dizzy.  Sexual problems. How is this diagnosed? Diagnosing and finding the  cause of peripheral neuropathy can be difficult. Your health care provider will take your medical history and do a physical exam. A neurological exam will also be done. This involves checking things that are affected by your brain, spinal cord, and nerves (nervous system). For example, your health care provider will check your reflexes, how you move, and what you can feel. You may have other tests, such as:  Blood tests.  Electromyogram (EMG) and nerve conduction tests. These tests check nerve function and how well the nerves are controlling the muscles.  Imaging tests, such as CT scans or MRI to rule out other causes of your symptoms.  Removing a small piece of nerve to be examined in a lab (nerve biopsy). This is rare.  Removing and examining a small amount of the fluid that surrounds the brain and spinal cord (lumbar puncture). This is rare. How is this treated? Treatment for this condition may involve:  Treating the underlying cause of the neuropathy, such as diabetes, kidney disease, or vitamin deficiencies.  Stopping medicines that can cause neuropathy, such as chemotherapy.  Medicine to relieve pain. Medicines may include: ? Prescription or over-the-counter pain medicine. ? Antiseizure medicine. ? Antidepressants. ? Pain-relieving patches that are applied to painful areas of skin.  Surgery to relieve pressure on a nerve or to destroy a nerve that is causing pain.  Physical therapy to help improve movement and balance.  Devices to help you move around (assistive devices). Follow these instructions at home: Medicines  Take over-the-counter and prescription medicines only as told by your health care provider. Do not take any other medicines without first asking your health care provider.  Do not drive or use heavy machinery while taking prescription pain medicine. Lifestyle   Do not use any products that contain nicotine or tobacco, such as cigarettes and e-cigarettes.  Smoking keeps blood from reaching damaged nerves. If you need help quitting, ask your health care provider.  Avoid or limit alcohol. Too much alcohol can cause a vitamin B deficiency, and vitamin B is needed for healthy nerves.  Eat a healthy diet. This includes: ? Eating foods that are high in fiber, such as fresh fruits and vegetables, whole grains, and beans. ? Limiting foods that are high in fat and processed sugars, such as fried or sweet foods. General instructions   If you have diabetes, work closely with your health care provider to keep your blood sugar under control.  If you have numbness in your feet: ? Check every day for signs of injury or infection. Watch for redness, warmth, and swelling. ? Wear padded socks and comfortable shoes. These help protect your feet.  Develop a good support system. Living with peripheral neuropathy  can be stressful. Consider talking with a mental health specialist or joining a support group.  Use assistive devices and attend physical therapy as told by your health care provider. This may include using a walker or a cane.  Keep all follow-up visits as told by your health care provider. This is important. Contact a health care provider if:  You have new signs or symptoms of peripheral neuropathy.  You are struggling emotionally from dealing with peripheral neuropathy.  Your pain is not well-controlled. Get help right away if:  You have an injury or infection that is not healing normally.  You develop new weakness in an arm or leg.  You fall frequently. Summary  Peripheral neuropathy is when the nerves in the arms, or legs are damaged, resulting in numbness, weakness, or pain.  There are many causes of peripheral neuropathy, including diabetes, pinched nerves, vitamin deficiencies, autoimmune disease, and hereditary conditions.  Diagnosing and finding the cause of peripheral neuropathy can be difficult. Your health care provider will  take your medical history, do a physical exam, and do tests, including blood tests and nerve function tests.  Treatment involves treating the underlying cause of the neuropathy and taking medicines to help control pain. Physical therapy and assistive devices may also help. This information is not intended to replace advice given to you by your health care provider. Make sure you discuss any questions you have with your health care provider. Document Revised: 10/17/2017 Document Reviewed: 01/13/2017 Elsevier Patient Education  2020 [place].  Peripheral Edema  Peripheral edema is swelling that is caused by a buildup of fluid. Peripheral edema most often affects the lower legs, ankles, and feet. It can also develop in the arms, hands, and face. The area of the body that has peripheral edema will look swollen. It may also feel heavy or warm. Your clothes may start to feel tight. Pressing on the area may make a temporary dent in your skin. You may not be able to move your swollen arm or leg as much as usual. There are many causes of peripheral edema. It can happen because of a complication of other conditions such as congestive heart failure, kidney disease, or a problem with your blood circulation. It also can be a side effect of certain medicines or because of an infection. It often happens to women during pregnancy. Sometimes, the cause is not known. Follow these instructions at home: Managing pain, stiffness, and swelling   Raise (elevate) your legs while you are sitting or lying down.  Move around often to prevent stiffness and to lessen swelling.  Do not sit or stand for long periods of time.  Wear support stockings as told by your health care provider. Medicines  Take over-the-counter and prescription medicines only as told by your health care provider.  Your health care provider may prescribe medicine to help your body get rid of excess water (diuretic). General  instructions  Pay attention to any changes in your symptoms.  Follow instructions from your health care provider about limiting salt (sodium) in your diet. Sometimes, eating less salt may reduce swelling.  Moisturize skin daily to help prevent skin from cracking and draining.  Keep all follow-up visits as told by your health care provider. This is important. Contact a health care provider if you have:  A fever.  Edema that starts suddenly or is getting worse, especially if you are pregnant or have a medical condition.  Swelling in only one leg.  Increased swelling, redness,  or pain in one or both of your legs.  Drainage or sores at the area where you have edema. Get help right away if you:  Develop shortness of breath, especially when you are lying down.  Have pain in your chest or abdomen.  Feel weak.  Feel faint. Summary  Peripheral edema is swelling that is caused by a buildup of fluid. Peripheral edema most often affects the lower legs, ankles, and feet.  Move around often to prevent stiffness and to lessen swelling. Do not sit or stand for long periods of time.  Pay attention to any changes in your symptoms.  Contact a health care provider if you have edema that starts suddenly or is getting worse, especially if you are pregnant or have a medical condition.  Get help right away if you develop shortness of breath, especially when lying down. This information is not intended to replace advice given to you by your health care provider. Make sure you discuss any questions you have with your health care provider. Document Revised: 07/29/2018 Document Reviewed: 07/29/2018 Elsevier Patient Education  [place].

## 2020-11-15 NOTE — Progress Notes (Signed)
Subjective:     Brittany Taylor is a 64 y.o. female and is here for a comprehensive physical exam. Recently seen by Derm for psoriasis.  Hoping to restart Cristy Folks which worked well in the past, however lost insurance. Recently able to get insurance.  Had mammogram in June.  States colonoscopy is past due.  Notes fatigue since late November when diagnosed with viral respiratory infection.  Blood sugar was 137.  A1c was 8% when checked at work.  Pt previously on Janumet but stopped medication 2/2 cost.  Now taking Metformin 1000 mg twice daily.  Pt notes occasional tingling/numbness in fingers.  Social History   Socioeconomic History  . Marital status: Single    Spouse name: Not on file  . Number of children: 0  . Years of education: Not on file  . Highest education level: Master's degree (e.g., MA, MS, MEng, MEd, MSW, MBA)  Occupational History  . Occupation: Teacher, adult education: [employer]  Tobacco Use  . Smoking status: Never Smoker  . Smokeless tobacco: Never Used  Vaping Use  . Vaping Use: Never used  Substance and Sexual Activity  . Alcohol use: Yes    Comment: occasionally  . Drug use: No  . Sexual activity: Yes  Other Topics Concern  . Not on file  Social History Narrative  . Not on file   Social Determinants of Health   Financial Resource Strain: Not on file  Food Insecurity: Not on file  Transportation Needs: No Transportation Needs  . Lack of Transportation (Medical): No  . Lack of Transportation (Non-Medical): No  Physical Activity: Not on file  Stress: Not on file  Social Connections: Not on file  Intimate Partner Violence: Not on file   Health Maintenance  Topic Date Due  . HEMOGLOBIN A1C  02/01/2019  . FOOT EXAM  02/07/2019  . COLONOSCOPY (Pts 45-48yrs Insurance coverage will need to be confirmed)  05/13/2019  . PAP SMEAR-Modifier  12/19/2020  . OPHTHALMOLOGY EXAM  04/28/2021  . MAMMOGRAM  06/15/2022  . TETANUS/TDAP  04/01/2029  . INFLUENZA VACCINE   Completed  . COVID-19 Vaccine  Completed  . Hepatitis C Screening  Completed  . HIV Screening  Completed    The following portions of the patient's history were reviewed and updated as appropriate: allergies, current medications, past family history, past medical history, past social history, past surgical history and problem list.  Review of Systems Pertinent items noted in HPI and remainder of comprehensive ROS otherwise negative.   Objective:    BP 122/76 (BP Location: Left Arm, Patient Position: Sitting, Cuff Size: Normal)   Pulse 93   Temp 97.7 F (36.5 C) (Oral)   Wt 185 lb 9.6 oz (84.2 kg)   SpO2 98%   BMI 31.86 kg/m  General appearance: alert, cooperative and no distress Head: Normocephalic, without obvious abnormality, atraumatic Eyes: conjunctivae/corneas clear. PERRL, EOM's intact. Fundi benign. Ears: normal TM's and external ear canals both ears Nose: Nares normal. Septum midline. Mucosa normal. No drainage or sinus tenderness. Throat: lips, mucosa, and tongue normal; teeth and gums normal Neck: no adenopathy, no carotid bruit, no JVD, supple, symmetrical, trachea midline and thyroid not enlarged, symmetric, no tenderness/mass/nodules Lungs: clear to auscultation bilaterally Heart: regular rate and rhythm, S1, S2 normal, no murmur, click, rub or gallop Abdomen: soft, non-tender; bowel sounds normal; no masses,  no organomegaly Extremities: extremities normal, atraumatic, no cyanosis or edema Pulses: 2+ and symmetric Skin: Warm, dry, intact, hyper and  hypopigmented areas.  Dry hyperpigmented plaques noted on UEs and LEs bilaterally. Lymph nodes: Cervical, supraclavicular, and axillary nodes normal.    Diabetic Foot Exam - Simple   Simple Foot Form Diabetic Foot exam was performed with the following findings: Yes 11/15/2020 11:41 AM  Visual Inspection No deformities, no ulcerations, no other skin breakdown bilaterally: Yes Sensation Testing See comments:  Yes Pulse Check See comments: Yes Comments Intact to monofilament testing b/l.  Decreased vibratory sensation b/l.  Diminished DP and PT pulses b/l. No ulcers or skin breakdown.      Assessment:    Healthy female exam.      Plan:     Anticipatory guidance given including wearing seatbelts, smoke detectors in the home, increasing physical activity, increasing p.o. intake of water and vegetables. -We will obtain labs -We will place referral for colonoscopy -Mammogram up-to-date is done 04/2020 -Pap up-to-date.  Due next year. -Given handout -Next CPE in 1 year See After Visit Summary for Counseling Recommendations    Essential hypertension -Controlled -Continue triamterene-hydrochlorothiazide 37.5-25 mg -Continue lifestyle modifications -Continue checking BP at home and keeping a log to bring with you to clinic - Plan: Basic Metabolic Panel  Peripheral polyneuropathy  -Discussed possible causes including diabetes, vitamin deficiency, thyroid dysfunction, idiopathic -We will obtain labs -Glycemic control important - Plan: TSH, CBC with Differential/Platelet, Vitamin B12, Folate  Type 2 diabetes mellitus with other specified complication, without long-term current use of insulin (HCC) -Discussed continuing lifestyle modifications -Continue Metformin 1000 mg twice daily -Foot exam done this visit---decreased vibratory sense noted. -We will obtain hemoglobin A1c.  If needed will add additional medication for better glycemic control. -Discussed follow-up with ophthalmology for diabetic retinopathy spine - Plan: Lipid panel, Hemoglobin A1c  Colon cancer screening  - Plan: Ambulatory referral to Gastroenterology  Psoriasis -restart skyrizi if able -continue f/u with Dermatology  F/u in 2 months  Grier Mitts, MD

## 2021-01-15 ENCOUNTER — Ambulatory Visit: Payer: Managed Care, Other (non HMO) | Admitting: Family Medicine

## 2021-01-15 ENCOUNTER — Encounter: Payer: Self-pay | Admitting: Family Medicine

## 2021-01-15 ENCOUNTER — Other Ambulatory Visit: Payer: Self-pay

## 2021-01-15 VITALS — BP 121/83 | HR 84 | Temp 98.3°F | Wt 183.4 lb

## 2021-01-15 DIAGNOSIS — E1169 Type 2 diabetes mellitus with other specified complication: Secondary | ICD-10-CM | POA: Diagnosis not present

## 2021-01-15 DIAGNOSIS — F339 Major depressive disorder, recurrent, unspecified: Secondary | ICD-10-CM

## 2021-01-15 DIAGNOSIS — L409 Psoriasis, unspecified: Secondary | ICD-10-CM | POA: Diagnosis not present

## 2021-01-15 DIAGNOSIS — I10 Essential (primary) hypertension: Secondary | ICD-10-CM | POA: Diagnosis not present

## 2021-01-15 MED ORDER — FLUOXETINE HCL 20 MG PO CAPS: 40.0000 mg | ORAL_CAPSULE | Freq: Every day | ORAL | 3 refills | Status: DC

## 2021-01-15 MED ORDER — HYDROXYZINE HCL 10 MG PO TABS: 10.0000 mg | ORAL_TABLET | Freq: Four times a day (QID) | ORAL | 2 refills | Status: AC | PRN

## 2021-01-15 MED ORDER — TRIAMTERENE-HCTZ 37.5-25 MG PO TABS: 1.0000 | ORAL_TABLET | Freq: Every day | ORAL | 3 refills | Status: DC

## 2021-01-15 NOTE — Patient Instructions (Signed)
Psoriasis Psoriasis is a long-term (chronic) skin condition. It occurs because your immune system causes skin cells to form too quickly. As a result, too many skin cells grow and create raised, red patches (plaques) that often look silvery on your skin. Plaques may show up anywhere on your body. They can be any size or shape. Symptoms of this condition range from mild to very severe. Psoriasis cannot be passed from one person to another (is not contagious). Sometimes, the symptoms go away and then come back again. What are the causes? The cause of psoriasis is not known, but certain factors can make the condition worse. These include:  Damage or trauma to the skin, such as cuts, scrapes, sunburn, and dryness.  Not enough exposure to sunlight.  Certain medicines.  Alcohol.  Tobacco use.  Stress.  Infections caused by bacteria or viruses. What increases the risk? You are more likely to develop this condition if you:  Have a family history of psoriasis.  Are obese.  Are 65 years old.  Are taking certain medicines. What are the signs or symptoms? There are different types of psoriasis. You can have more than one type of psoriasis during your life. The types are:  Plaque. This is the most common.  Guttate. This is also called eruptive psoriasis.  Inverse.  Pustular.  Erythrodermic.  Sebopsoriasis.  Psoriatic arthritis. Each type of psoriasis has different symptoms.  Plaque psoriasis symptoms include red, raised plaques with a silvery-white coating (scale). These plaques may be itchy. Your nails may be pitted and crumbly or fall off.  Guttate psoriasis symptoms include small red spots that often show up on your trunk, arms, and legs. These spots may develop after you have been sick, especially with strep throat.  Inverse psoriasis symptoms include plaques in your underarm area, under your breasts, or on your genitals, groin, or buttocks.  Pustular psoriasis symptoms  include pus-filled bumps that are painful, red, and swollen on the palms of your hands or the soles of your feet. You also may feel exhausted, feverish, weak, or have no appetite.  Erythrodermic psoriasis symptoms include bright red skin that may look burned. You may have a fast heartbeat and a body temperature that is too high or too low. You may be itchy or in pain.  Sebopsoriasis symptoms include red plaques that have a greasy coating, and are often on your scalp, forehead, and face.  Psoriatic arthritis causes swollen, painful joints along with scaly skin plaques.   How is this diagnosed? This condition is diagnosed based on your symptoms, family history, and a physical exam.  You may also be referred to a health care provider who specializes in skin diseases (dermatologist).  Your health care provider may remove a tissue sample (biopsy) for testing. How is this treated? There is no cure for this condition, but treatment can help manage it. Goals of treatment include:  Helping your skin heal.  Reducing itching and inflammation.  Slowing the growth of new skin cells.  Helping your immune system respond better to your skin. Treatment varies, depending on the severity of your condition. This condition may be treated by:  Creams or ointments to help with symptoms.  Ultraviolet ray exposure (light therapy or phototherapy). This may include natural sunlight or light therapy in a medical office.  Medicines (systemic therapy). These medicines can help your body better manage skin cell turnover and inflammation. Medicines may be given in the form of pills or injections. They may be used along  with light therapy or ointments. You may also get antibiotic medicines if you have an infection. Follow these instructions at home: [place] your skin as needed. Only use moisturizers that have been approved by your health care provider.  Apply cool, wet cloths (cold compresses) to the  affected areas.  Do not use a hot tub or take hot showers. Take lukewarm showers and baths.  Do not scratch your skin. Lifestyle  Do not use any products that contain nicotine or tobacco, such as cigarettes, e-cigarettes, and chewing tobacco. If you need help quitting, ask your health care provider.  Use techniques for stress reduction, such as meditation or yoga.  Maintain a healthy weight. Follow instructions from your health care provider for weight control. These may include dietary restrictions.  Get safe exposure to the sun as told by your health care provider. This may include spending regular intervals of time outdoors in sunlight. Do not get sunburned.  Consider joining a psoriasis support group.   Medicines  Take or use over-the-counter and prescription medicines only as told by your health care provider.  If you were prescribed an antibiotic medicine, take it as told by your health care provider. Do not stop using the antibiotic even if you start to feel better. Alcohol use  Limit how much you use: ? 0-1 drink a day for women. ? 0-2 drinks a day for men.  Be aware of how much alcohol is in your drink. In the [place] one drink equals one 12 oz bottle of beer (355 mL), one 5 oz glass of wine (148 mL), or one 1 oz glass of hard liquor (44 mL). General instructions  Keep a journal to help track what triggers an outbreak. Try to avoid any triggers.  See a counselor if feelings of sadness, frustration, and hopelessness about your condition are interfering with your work and relationships.  Keep all follow-up visits as told by your health care provider. This is important. Contact a health care provider if:  You have a fever.  Your pain gets worse.  You have increasing redness or warmth in the affected areas.  You have new or worsening pain or stiffness in your joints.  Your nails start to break easily or pull away from the nail bed.  You feel  depressed. Summary  Psoriasis is a long-term (chronic) skin condition. Patches (plaques) may show up anywhere on your body.  There is no cure for this condition, but treatment can help manage it. Treatment varies, depending on the severity of your condition.  Keep a journal to track what triggers an outbreak. Try to avoid any triggers.  Take or use over-the-counter and prescription medicines only as told by your health care provider.  Keep all follow-up visits as told by your health care provider. This is important. This information is not intended to replace advice given to you by your health care provider. Make sure you discuss any questions you have with your health care provider. Document Revised: 09/08/2018 Document Reviewed: 09/08/2018 Elsevier Patient Education  2021 [place].  Managing Your Hypertension Hypertension, also called high blood pressure, is when the force of the blood pressing against the walls of the arteries is too strong. Arteries are blood vessels that carry blood from your heart throughout your body. Hypertension forces the heart to work harder to pump blood and may cause the arteries to become narrow or stiff. Understanding blood pressure readings Your personal target blood pressure may vary depending on your  medical conditions, your age, and other factors. A blood pressure reading includes a higher number over a lower number. Ideally, your blood pressure should be below 120/80. You should know that:  The first, or top, number is called the systolic pressure. It is a measure of the pressure in your arteries as your heart beats.  The second, or bottom number, is called the diastolic pressure. It is a measure of the pressure in your arteries as the heart relaxes. Blood pressure is classified into four stages. Based on your blood pressure reading, your health care provider may use the following stages to determine what type of treatment you need, if any. Systolic  pressure and diastolic pressure are measured in a unit called mmHg. Normal  Systolic pressure: below 672.  Diastolic pressure: below 80. Elevated  Systolic pressure: 094-709.  Diastolic pressure: below 80. Hypertension stage 1  Systolic pressure: 628-366.  Diastolic pressure: 29-47. Hypertension stage 2  Systolic pressure: 654 or above.  Diastolic pressure: 90 or above. How can this condition affect me? Managing your hypertension is an important responsibility. Over time, hypertension can damage the arteries and decrease blood flow to important parts of the body, including the brain, heart, and kidneys. Having untreated or uncontrolled hypertension can lead to:  A heart attack.  A stroke.  A weakened blood vessel (aneurysm).  Heart failure.  Kidney damage.  Eye damage.  Metabolic syndrome.  Memory and concentration problems.  Vascular dementia. What actions can I take to manage this condition? Hypertension can be managed by making lifestyle changes and possibly by taking medicines. Your health care provider will help you make a plan to bring your blood pressure within a normal range. Nutrition  Eat a diet that is high in fiber and potassium, and low in salt (sodium), added sugar, and fat. An example eating plan is called the Dietary Approaches to Stop Hypertension (DASH) diet. To eat this way: ? Eat plenty of fresh fruits and vegetables. Try to fill one-half of your plate at each meal with fruits and vegetables. ? Eat whole grains, such as whole-wheat pasta, brown rice, or whole-grain bread. Fill about one-fourth of your plate with whole grains. ? Eat low-fat dairy products. ? Avoid fatty cuts of meat, processed or cured meats, and poultry with skin. Fill about one-fourth of your plate with lean proteins such as fish, chicken without skin, beans, eggs, and tofu. ? Avoid pre-made and processed foods. These tend to be higher in sodium, added sugar, and fat.  Reduce  your daily sodium intake. Most people with hypertension should eat less than 1,500 mg of sodium a day.   Lifestyle  Work with your health care provider to maintain a healthy body weight or to lose weight. Ask what an ideal weight is for you.  Get at least 30 minutes of exercise that causes your heart to beat faster (aerobic exercise) most days of the week. Activities may include walking, swimming, or biking.  Include exercise to strengthen your muscles (resistance exercise), such as weight lifting, as part of your weekly exercise routine. Try to do these types of exercises for 30 minutes at least 3 days a week.  Do not use any products that contain nicotine or tobacco, such as cigarettes, e-cigarettes, and chewing tobacco. If you need help quitting, ask your health care provider.  Control any long-term (chronic) conditions you have, such as high cholesterol or diabetes.  Identify your sources of stress and find ways to manage stress. This may include  meditation, deep breathing, or making time for fun activities.   Alcohol use  Do not drink alcohol if: ? Your health care provider tells you not to drink. ? You are pregnant, may be pregnant, or are planning to become pregnant.  If you drink alcohol: ? Limit how much you use to:  0-1 drink a day for women.  0-2 drinks a day for men. ? Be aware of how much alcohol is in your drink. In the [place] one drink equals one 12 oz bottle of beer (355 mL), one 5 oz glass of wine (148 mL), or one 1 oz glass of hard liquor (44 mL). Medicines Your health care provider may prescribe medicine if lifestyle changes are not enough to get your blood pressure under control and if:  Your systolic blood pressure is 130 or higher.  Your diastolic blood pressure is 80 or higher. Take medicines only as told by your health care provider. Follow the directions carefully. Blood pressure medicines must be taken as told by your health care provider. The medicine does  not work as well when you skip doses. Skipping doses also puts you at risk for problems. Monitoring Before you monitor your blood pressure:  Do not smoke, drink caffeinated beverages, or exercise within 30 minutes before taking a measurement.  Use the bathroom and empty your bladder (urinate).  Sit quietly for at least 5 minutes before taking measurements. Monitor your blood pressure at home as told by your health care provider. To do this:  Sit with your back straight and supported.  Place your feet flat on the floor. Do not cross your legs.  Support your arm on a flat surface, such as a table. Make sure your upper arm is at heart level.  Each time you measure, take two or three readings one minute apart and record the results. You may also need to have your blood pressure checked regularly by your health care provider.   General information  Talk with your health care provider about your diet, exercise habits, and other lifestyle factors that may be contributing to hypertension.  Review all the medicines you take with your health care provider because there may be side effects or interactions.  Keep all visits as told by your health care provider. Your health care provider can help you create and adjust your plan for managing your high blood pressure. Where to find more information  National Heart, Lung, and Blood Institute: https://wilson-eaton.com/  American Heart Association: www.heart.org Contact a health care provider if:  You think you are having a reaction to medicines you have taken.  You have repeated (recurrent) headaches.  You feel dizzy.  You have swelling in your ankles.  You have trouble with your vision. Get help right away if:  You develop a severe headache or confusion.  You have unusual weakness or numbness, or you feel faint.  You have severe pain in your chest or abdomen.  You vomit repeatedly.  You have trouble breathing. These symptoms may  represent a serious problem that is an emergency. Do not wait to see if the symptoms will go away. Get medical help right away. Call your local emergency services (911 in the [place] Do not drive yourself to the hospital. Summary  Hypertension is when the force of blood pumping through your arteries is too strong. If this condition is not controlled, it may put you at risk for serious complications.  Your personal target blood pressure may vary depending on your medical  conditions, your age, and other factors. For most people, a normal blood pressure is less than 120/80.  Hypertension is managed by lifestyle changes, medicines, or both.  Lifestyle changes to help manage hypertension include losing weight, eating a healthy, low-sodium diet, exercising more, stopping smoking, and limiting alcohol. This information is not intended to replace advice given to you by your health care provider. Make sure you discuss any questions you have with your health care provider. Document Revised: 12/10/2019 Document Reviewed: 10/05/2019 Elsevier Patient Education  2021 [place].  Preventing Diabetes Mellitus Complications You can help to prevent or slow down problems that are caused by diabetes (diabetes mellitus). Following your diabetes plan and taking care of yourself can reduce your risk of serious or life-threatening complications. What actions can I take to prevent diabetes complications? Diabetes management  Follow instructions from your health care providers about managing your diabetes. Your diabetes may be managed by a team of health care providers who can teach you how to care for yourself and can answer questions that you have.  Educate yourself about your condition so you can make healthy choices about eating and physical activity.  Know your target range for your blood sugar (glucose), and check your blood glucose level as often as told. Your health care provider will help you decide how  often to check your blood glucose level depending on your treatment goals and how well you are meeting them.  Ask your health care provider if you should take low-dose aspirin daily and what dose is recommended for you. Taking low-dose aspirin daily is recommended to help prevent cardiovascular disease.   Controlling your blood pressure and cholesterol Your personal target blood pressure is determined based on:  Your age.  Your medicines.  How long you have had diabetes.  Any other medical conditions you have. To control your blood pressure:  Follow instructions from your health care provider about meal planning, exercise, and medicines.  Make sure your health care provider checks your blood pressure at every medical visit.  Monitor your blood pressure at home as told by your health care provider. To control your cholesterol:  Follow instructions from your health care provider about meal planning, exercise, and medicines.  Have your cholesterol checked at least once a year.  You may be prescribed medicine to lower cholesterol (statin). If you are not taking a statin, ask your health care provider if you should be. Controlling your cholesterol may:  Help prevent heart disease and stroke. These are the most common health problems for people with diabetes.  Improve your blood flow.   Medical appointments and vaccines Schedule and keep yearly physical exams and eye exams. Your health care provider will tell you how often you need medical visits depending on your diabetes management plan. Keep all follow-up visits as told. This is important so possible problems can be identified early and complications can be avoided or treated.  Every visit with your health care provider should include measuring your: ? Weight. ? Blood pressure. ? Blood glucose control.  Your A1C (hemoglobin A1C) level should be checked: ? At least 2 times a year, if you are meeting your treatment goals. ? 4  times a year, if you are not meeting treatment goals or if your treatment goals have changed.  Your blood lipids (lipid profile) should be checked yearly. You should also be checked yearly for protein in your urine (urine microalbumin).  If you have type 1 diabetes, get an eye exam 3-5  years after you are diagnosed, and then once a year after your first exam.  If you have type 2 diabetes, get an eye exam as soon as you are diagnosed, and then once a year after your first exam. It is also important to keep your vaccines current. It is recommended that you receive:  A flu (influenza) vaccine every year.  A pneumonia (pneumococcal) vaccine and a hepatitis B vaccine. If you are age 6 or older, you may get the pneumonia vaccine as a series of two separate shots. Ask your health care provider which other vaccines may be recommended. Lifestyle  Do not use any products that contain nicotine or tobacco, such as cigarettes, e-cigarettes, and chewing tobacco. If you need help quitting, ask your health care provider. By avoiding nicotine and tobacco: ? You will lower your risk for heart attack, stroke, nerve disease, and kidney disease. ? Your cholesterol and blood pressure may improve. ? Your blood circulation will improve.  If you drink alcohol: ? Limit how much you use to:  0-1 drink a day for women who are not pregnant.  0-2 drinks a day for men. ? Be aware of how much alcohol is in your drink. In the [place] one drink equals one 12 oz bottle of beer (355 mL), one 5 oz glass of wine (148 mL), or one 11?2 oz glass of hard liquor (44 mL). Taking care of your feet Diabetes may cause you to have poor blood circulation to your legs and feet. Because of this, taking care of your feet is very important. Diabetes can cause:  The skin on the feet to get thinner, break more easily, and heal more slowly.  Nerve damage in your legs and feet, which results in decreased feeling. You may not notice minor  injuries that could lead to serious problems. To avoid foot problems:  Check your skin and feet every day for cuts, bruises, redness, blisters, or sores.  Schedule a foot exam with your health care provider once every year. This exam includes: ? Inspecting the structure and skin of your feet. ? Checking the pulses and sensation in your feet.  Make sure that your health care provider performs a visual foot exam at every medical visit.   Taking care of your teeth People with poorly controlled diabetes are more likely to have gum (periodontal) disease. Diabetes can make periodontal diseases harder to control. If not treated, periodontal diseases can lead to tooth loss. To prevent this:  Brush your teeth twice a day.  Floss at least once a day.  Visit your dentist 2 times a year. Managing stress Living with diabetes can be stressful. When you are experiencing stress, your blood glucose may be affected in two ways:  Stress hormones may cause your blood glucose to rise.  You may be distracted from taking good care of yourself. Be aware of your stress level and make changes to help you manage challenging situations. To lower your stress levels:  Consider joining a support group.  Do planned relaxation or meditation.  Do a hobby that you enjoy.  Maintain healthy relationships.  Exercise regularly.  Work with your health care provider or a mental health professional. Where to find more information  American Diabetes Association: www.diabetes.org  Association of Diabetes Care and Education Specialists: www.diabeteseducator.org Summary  You can take action to prevent or slow down problems that are caused by diabetes (diabetes mellitus). Following your diabetes plan and taking care of yourself can reduce your risk of  serious or life-threatening complications.  Follow instructions from your health care providers about managing your diabetes. Your diabetes may be managed by a team of  health care providers who can teach you how to care for yourself and can answer questions that you have.  Know your target range for your blood sugar (glucose), and check your blood glucose levels as often as told. Your health care provider will help you decide how often you should check your blood glucose level depending on your treatment goals and how well you are meeting them.  Your health care provider will tell you how often you need medical visits depending on your diabetes management plan. Keep all follow-up visits as directed. This is important so possible problems can be identified early and complications can be avoided or treated. This information is not intended to replace advice given to you by your health care provider. Make sure you discuss any questions you have with your health care provider. Document Revised: 12/24/2019 Document Reviewed: 12/24/2019 Elsevier Patient Education  2021 [place].  Diabetes Mellitus and Nutrition, Adult When you have diabetes, or diabetes mellitus, it is very important to have healthy eating habits because your blood sugar (glucose) levels are greatly affected by what you eat and drink. Eating healthy foods in the right amounts, at about the same times every day, can help you:  Control your blood glucose.  Lower your risk of heart disease.  Improve your blood pressure.  Reach or maintain a healthy weight. What can affect my meal plan? Every person with diabetes is different, and each person has different needs for a meal plan. Your health care provider may recommend that you work with a dietitian to make a meal plan that is best for you. Your meal plan may vary depending on factors such as:  The calories you need.  The medicines you take.  Your weight.  Your blood glucose, blood pressure, and cholesterol levels.  Your activity level.  Other health conditions you have, such as heart or kidney disease. How do carbohydrates affect  me? Carbohydrates, also called carbs, affect your blood glucose level more than any other type of food. Eating carbs naturally raises the amount of glucose in your blood. Carb counting is a method for keeping track of how many carbs you eat. Counting carbs is important to keep your blood glucose at a healthy level, especially if you use insulin or take certain oral diabetes medicines. It is important to know how many carbs you can safely have in each meal. This is different for every person. Your dietitian can help you calculate how many carbs you should have at each meal and for each snack. How does alcohol affect me? Alcohol can cause a sudden decrease in blood glucose (hypoglycemia), especially if you use insulin or take certain oral diabetes medicines. Hypoglycemia can be a life-threatening condition. Symptoms of hypoglycemia, such as sleepiness, dizziness, and confusion, are similar to symptoms of having too much alcohol.  Do not drink alcohol if: ? Your health care provider tells you not to drink. ? You are pregnant, may be pregnant, or are planning to become pregnant.  If you drink alcohol: ? Do not drink on an empty stomach. ? Limit how much you use to:  0-1 drink a day for women.  0-2 drinks a day for men. ? Be aware of how much alcohol is in your drink. In the [place] one drink equals one 12 oz bottle of beer (355 mL), one 5  oz glass of wine (148 mL), or one 1 oz glass of hard liquor (44 mL). ? Keep yourself hydrated with water, diet soda, or unsweetened iced tea.  Keep in mind that regular soda, juice, and other mixers may contain a lot of sugar and must be counted as carbs. What are tips for following this plan? Reading food labels  Start by checking the serving size on the "Nutrition Facts" label of packaged foods and drinks. The amount of calories, carbs, fats, and other nutrients listed on the label is based on one serving of the item. Many items contain more than one serving  per package.  Check the total grams (g) of carbs in one serving. You can calculate the number of servings of carbs in one serving by dividing the total carbs by 15. For example, if a food has 30 g of total carbs per serving, it would be equal to 2 servings of carbs.  Check the number of grams (g) of saturated fats and trans fats in one serving. Choose foods that have a low amount or none of these fats.  Check the number of milligrams (mg) of salt (sodium) in one serving. Most people should limit total sodium intake to less than 2,300 mg per day.  Always check the nutrition information of foods labeled as "low-fat" or "nonfat." These foods may be higher in added sugar or refined carbs and should be avoided.  Talk to your dietitian to identify your daily goals for nutrients listed on the label. Shopping  Avoid buying canned, pre-made, or processed foods. These foods tend to be high in fat, sodium, and added sugar.  Shop around the outside edge of the grocery store. This is where you will most often find fresh fruits and vegetables, bulk grains, fresh meats, and fresh dairy. Cooking  Use low-heat cooking methods, such as baking, instead of high-heat cooking methods like deep frying.  Cook using healthy oils, such as olive, canola, or sunflower oil.  Avoid cooking with butter, cream, or high-fat meats. Meal planning  Eat meals and snacks regularly, preferably at the same times every day. Avoid going long periods of time without eating.  Eat foods that are high in fiber, such as fresh fruits, vegetables, beans, and whole grains. Talk with your dietitian about how many servings of carbs you can eat at each meal.  Eat 4-6 oz (112-168 g) of lean protein each day, such as lean meat, chicken, fish, eggs, or tofu. One ounce (oz) of lean protein is equal to: ? 1 oz (28 g) of meat, chicken, or fish. ? 1 egg. ?  cup (62 g) of tofu.  Eat some foods each day that contain healthy fats, such as  avocado, nuts, seeds, and fish.   What foods should I eat? Fruits Berries. Apples. Oranges. Peaches. Apricots. Plums. Grapes. Mango. Papaya. Pomegranate. Kiwi. Cherries. Vegetables Lettuce. Spinach. Leafy greens, including kale, chard, collard greens, and mustard greens. Beets. Cauliflower. Cabbage. Broccoli. Carrots. Green beans. Tomatoes. Peppers. Onions. Cucumbers. Brussels sprouts. Grains Whole grains, such as whole-wheat or whole-grain bread, crackers, tortillas, cereal, and pasta. Unsweetened oatmeal. Quinoa. Brown or wild rice. Meats and other proteins Seafood. Poultry without skin. Lean cuts of poultry and beef. Tofu. Nuts. Seeds. Dairy Low-fat or fat-free dairy products such as milk, yogurt, and cheese. The items listed above may not be a complete list of foods and beverages you can eat. Contact a dietitian for more information. What foods should I avoid? Fruits Fruits canned with syrup.  Vegetables Canned vegetables. Frozen vegetables with butter or cream sauce. Grains Refined white flour and flour products such as bread, pasta, snack foods, and cereals. Avoid all processed foods. Meats and other proteins Fatty cuts of meat. Poultry with skin. Breaded or fried meats. Processed meat. Avoid saturated fats. Dairy Full-fat yogurt, cheese, or milk. Beverages Sweetened drinks, such as soda or iced tea. The items listed above may not be a complete list of foods and beverages you should avoid. Contact a dietitian for more information. Questions to ask a health care provider  Do I need to meet with a diabetes educator?  Do I need to meet with a dietitian?  What number can I call if I have questions?  When are the best times to check my blood glucose? Where to find more information:  American Diabetes Association: diabetes.org  Academy of Nutrition and Dietetics: www.eatright.[employer] of Diabetes and Digestive and Kidney Diseases:  [employer]  Association of Diabetes Care and Education Specialists: www.diabeteseducator.org Summary  It is important to have healthy eating habits because your blood sugar (glucose) levels are greatly affected by what you eat and drink.  A healthy meal plan will help you control your blood glucose and maintain a healthy lifestyle.  Your health care provider may recommend that you work with a dietitian to make a meal plan that is best for you.  Keep in mind that carbohydrates (carbs) and alcohol have immediate effects on your blood glucose levels. It is important to count carbs and to use alcohol carefully. This information is not intended to replace advice given to you by your health care provider. Make sure you discuss any questions you have with your health care provider. Document Revised: 10/12/2019 Document Reviewed: 10/12/2019 Elsevier Patient Education  2021 [employer].

## 2021-01-15 NOTE — Progress Notes (Signed)
Subjective:    Patient ID: Brittany Taylor, female    DOB: 1956-04-15, 65 y.o.   MRN: [id number]  Chief Complaint  Patient presents with  . Follow-up    HPI Patient was seen today for f/u.  Pt back on medications.  Note blood sugar in am is 1teens-130s.  Working on diet and exercise.  Drinking more water throughout the day.  BP controlled on triamterene-HCTZ 37.5-25 mg.  Pt back on Skyrizi for psoriasis.  Notes improvement in lesions.  Having return pruritus.  Needs refill on hydroxyzine.  Requesting refills.  Past Medical History:  Diagnosis Date  . Allergy-induced asthma    daily inhaler  . Anxiety   . Arthritis    hands  . Dental crown present   . Depression   . GERD (gastroesophageal reflux disease)   . High cholesterol   . Hypertension    states under control with med., has been on med. x 10 yr.  . Non-insulin dependent type 2 diabetes mellitus ([place])   . PONV (postoperative nausea and vomiting)    patient thinks related to Morphine  . Psoriasis    arms, bilateral leg, back, breast, neck, ears  . Stenosing tenosynovitis of finger of right hand 03/2015   right ring finger    Allergies  Allergen Reactions  . Ace Inhibitors Other (See Comments)    ANGIOEDEMA   . Morphine Itching and Nausea And Vomiting  . Ramipril Other (See Comments)    ANGIOEDEMA  . Mold Extract [Trichophyton] Itching and Cough    Throat itches  . Latex Rash    Used latex gloves with powder, rash occured  . Mercurochrome [Merbromin] Rash    ROS General: Denies fever, chills, night sweats, changes in weight, changes in appetite HEENT: Denies headaches, ear pain, changes in vision, rhinorrhea, sore throat CV: Denies CP, palpitations, SOB, orthopnea Pulm: Denies SOB, cough, wheezing GI: Denies abdominal pain, nausea, vomiting, diarrhea, constipation GU: Denies dysuria, hematuria, frequency, vaginal discharge Msk: Denies muscle cramps, joint pains Neuro: Denies weakness, numbness, tingling Skin:  Denies rashes, bruising  + pruritus Psych: Denies depression, anxiety, hallucinations    Objective:    Blood pressure 121/83, pulse 84, temperature 98.3 F (36.8 C), temperature source Oral, weight 183 lb 6.4 oz (83.2 kg), SpO2 96 %.  Gen. Pleasant, well-nourished, in no distress, normal affect   HEENT: [place]/AT, face symmetric, conjunctiva clear, no scleral icterus, PERRLA, EOMI, nares patent without drainage Lungs: no accessory muscle use, CTAB, no wheezes or rales Cardiovascular: RRR, no m/r/g, no peripheral edema Musculoskeletal: No deformities, no cyanosis or clubbing, normal tone Neuro:  A&Ox3, CN II-XII intact, normal gait Skin:  Warm, dry, intact.  Round, pruritic silver plaques on bilateral forearms.   Wt Readings from Last 3 Encounters:  01/15/21 183 lb 6.4 oz (83.2 kg)  11/15/20 185 lb 9.6 oz (84.2 kg)  10/11/20 186 lb (84.4 kg)    Lab Results  Component Value Date   WBC 2.9 (L) 11/15/2020   HGB 12.1 11/15/2020   HCT 36.6 11/15/2020   PLT 309.0 11/15/2020   GLUCOSE 145 (H) 11/15/2020   CHOL 232 (H) 11/15/2020   TRIG 127.0 11/15/2020   HDL 70.60 11/15/2020   LDLCALC 136 (H) 11/15/2020   ALT 23 08/08/2017   AST 25 08/08/2017   NA 136 11/15/2020   K 3.8 11/15/2020   CL 97 11/15/2020   CREATININE 0.75 11/15/2020   BUN 15 11/15/2020   CO2 29 11/15/2020   TSH 1.40 11/15/2020  HGBA1C 7.8 (H) 11/15/2020   MICROALBUR <0.7 08/08/2017    Assessment/Plan:  Type 2 diabetes mellitus with other specified complication, without long-term current use of insulin (HCC) -Hemoglobin A1c 7.8% on 11/15/2020 -Continue lifestyle modifications -Continue Metformin 1000 mg twice daily. -Consider restarting Janumet for better control if needed as previously did well on medication. -Foot exam up-to-date done 11/15/2020 -Diabetic retinopathy screen negative on 04/28/2020 -We'll obtain microalbumin creatinine at next OFV.  Essential hypertension -Controlled -Continue current  medications including triamterene-hydrochlorothiazide 37.5-25 mg daily -Continue lifestyle occasions -Patient encouraged to continue checking BP at home and keep a log to bring with her to clinic - Plan: triamterene-hydrochlorothiazide (MAXZIDE-25) 37.5-25 MG tablet  Psoriasis -Improving -Continue follow-up with dermatology -Continue Skyrizi -Hydroxyzine sent to pharmacy to help with pruritus - Plan: hydrOXYzine (ATARAX/VISTARIL) 10 MG tablet  Depression, recurrent (HCC) -Stable -We'll continue Prozac 40 mg daily - Plan: FLUoxetine (PROZAC) 20 MG capsule  Will discuss colonoscopy for colon cancer screening at next OFV. F/u in 3 months, sooner if needed  Grier Mitts, MD

## 2021-02-04 ENCOUNTER — Other Ambulatory Visit: Payer: Self-pay | Admitting: Family Medicine

## 2021-02-04 DIAGNOSIS — E119 Type 2 diabetes mellitus without complications: Secondary | ICD-10-CM

## 2021-02-13 ENCOUNTER — Encounter: Payer: Self-pay | Admitting: Nurse Practitioner

## 2021-02-13 ENCOUNTER — Ambulatory Visit (INDEPENDENT_AMBULATORY_CARE_PROVIDER_SITE_OTHER): Payer: Managed Care, Other (non HMO) | Admitting: Nurse Practitioner

## 2021-02-13 VITALS — BP 120/80 | HR 64 | Ht 64.5 in | Wt 183.0 lb

## 2021-02-13 DIAGNOSIS — Z1211 Encounter for screening for malignant neoplasm of colon: Secondary | ICD-10-CM | POA: Diagnosis not present

## 2021-02-13 DIAGNOSIS — K625 Hemorrhage of anus and rectum: Secondary | ICD-10-CM

## 2021-02-13 MED ORDER — PLENVU 140 G PO SOLR: ORAL | 0 refills | Status: DC

## 2021-02-13 NOTE — Patient Instructions (Addendum)
If you are age 65 or younger, your body mass index should be between 19-25. Your Body mass index is 30.93 kg/m. If this is out of the aformentioned range listed, please consider follow up with your Primary Care Provider.   PROCEDURES: You have been scheduled for a colonoscopy. Please follow the written instructions given to you at your visit today. Please pick up your prep supplies at the pharmacy within the next 1-3 days. If you use inhalers (even only as needed), please bring them with you on the day of your procedure.  RECOMMENDATIONS: Desitin: Apply a small amount to the external anal area three times a day as needed. Miralax- Dissolve one capful in 8 ounces of water and drink before bed. Benefiber- 1 tablespoon daily.   It was great seeing you today! Thank you for entrusting me with your care and choosing [facility].  Noralyn Pick, CRNP

## 2021-02-13 NOTE — Progress Notes (Signed)
Agree with assessment and plan as outlined.  

## 2021-02-13 NOTE — Progress Notes (Signed)
02/13/2021 SHANTEA POULTON [id number] 04/13/1964   CHIEF COMPLAINT: Schedule colonoscopy  HISTORY OF PRESENT ILLNESS:  History of anxiety, depression, hypertension, hypercholesterolemia, DM II and psoriasis.  Past hysterectomy.  She presents to our office as referred by Dr. Grier Mitts to schedule a screening colonoscopy.  She has occasional constipation for which she takes Colace as needed.  She sees a small amount of bright red blood on the toilet tissue and sometimes on the stool which occurs approximately once every 2 months she attributes to having hemorrhoids.  She denies having any upper or lower abdominal pain.  She develops heartburn if she eats greasy or spicy foods.  No dysphagia.  She underwent a colonoscopy by Dr. Deatra Ina 05/12/2009 which was normal.  No known family history of esophageal, gastric or colon cancer.  No other complaints at this time.   CBC Latest Ref Rng & Units 11/15/2020 03/25/2018 03/24/2018  WBC 4.0 - 10.5 K/uL 2.9(L) 5.9 5.3  Hemoglobin 12.0 - 15.0 g/dL 12.1 9.7(L) 9.1(L)  Hematocrit 36.0 - 46.0 % 36.6 29.5(L) 28.1(L)  Platelets 150.0 - 400.0 K/uL 309.0 304 279   CMP Latest Ref Rng & Units 11/15/2020 03/11/2018 08/08/2017  Glucose 70 - 99 mg/dL 145(H) 94 93  BUN 6 - 23 mg/dL 15 14 21   Creatinine 0.40 - 1.20 mg/dL 0.75 0.82 0.83  Sodium 135 - 145 mEq/L 136 140 136  Potassium 3.5 - 5.1 mEq/L 3.8 4.4 3.9  Chloride 96 - 112 mEq/L 97 107 99  CO2 19 - 32 mEq/L 29 22 30   Calcium 8.4 - 10.5 mg/dL 9.6 9.3 9.8  Total Protein 6.0 - 8.3 g/dL - - 7.6  Total Bilirubin 0.2 - 1.2 mg/dL - - 0.5  Alkaline Phos 39 - 117 U/L - - 87  AST 0 - 37 U/L - - 25  ALT 0 - 35 U/L - - 23    Past Medical History:  Diagnosis Date  . Allergy-induced asthma    daily inhaler  . Anxiety   . Arthritis    hands  . Dental crown present   . Depression   . GERD (gastroesophageal reflux disease)   . High cholesterol   . Hypertension    states under control with med., has been on  med. x 10 yr.  . Non-insulin dependent type 2 diabetes mellitus ([place])   . PONV (postoperative nausea and vomiting)    patient thinks related to Morphine  . Psoriasis    arms, bilateral leg, back, breast, neck, ears  . Stenosing tenosynovitis of finger of right hand 03/2015   right ring finger   Past Surgical History:  Procedure Laterality Date  . BREAST REDUCTION SURGERY  2000  . CYSTOSCOPY N/A 03/23/2018   Procedure: CYSTOSCOPY;  Surgeon: Delsa Bern, MD;  Location: [facility];  Service: Gynecology;  Laterality: N/A;  . LEEP  03/18/2003  . MYOMECTOMY  1995  . POLYPECTOMY  01/2015   uterine  . REDUCTION MAMMAPLASTY     2000  . SUPRACERVICAL ABDOMINAL HYSTERECTOMY Bilateral 03/23/2018   Procedure: HYSTERECTOMY SUPRACERVICAL ABDOMINAL;  Surgeon: Delsa Bern, MD;  Location: [facility];  Service: Gynecology;  Laterality: Bilateral;  . TRIGGER FINGER RELEASE Left 06/15/2009   thumb  . TRIGGER FINGER RELEASE Right 11/15/2008   thumb  . TRIGGER FINGER RELEASE Right 04/13/2015   Procedure: RELEASE A-1 PULLEY RIGHT RING FINGER;  Surgeon: Daryll Brod, MD;  Location: [facility];  Service: Orthopedics;  Laterality: Right;  . WISDOM  TOOTH EXTRACTION  1972   Social History: She is school Marine scientist. Nonsmoker. No alcohol or drug use.  Family History: Mother deceased dementia, questionable stroke and pneumonia. Father died from stroke.   Allergies  Allergen Reactions  . Ace Inhibitors Other (See Comments)    ANGIOEDEMA   . Morphine Itching and Nausea And Vomiting  . Ramipril Other (See Comments)    ANGIOEDEMA  . Mold Extract [Trichophyton] Itching and Cough    Throat itches  . Latex Rash    Used latex gloves with powder, rash occured  . Mercurochrome [Merbromin] Rash      Outpatient Encounter Medications as of 02/13/2021  Medication Sig  . aspirin 81 MG tablet Take 162 mg by mouth daily.   Marland Kitchen azelastine (OPTIVAR) 0.05 % ophthalmic solution Place 1 drop into both eyes 2 (two) times  daily as needed (for itchy/allergy eyes).   . benzonatate (TESSALON) 100 MG capsule Take 1 capsule (100 mg total) by mouth 3 (three) times daily as needed for cough.  . budesonide-formoterol (SYMBICORT) 160-4.5 MCG/ACT inhaler Inhale 1-2 puffs into the lungs 2 (two) times daily.  Marland Kitchen docusate sodium (COLACE) 100 MG capsule Take 1 capsule (100 mg total) by mouth 2 (two) times daily.  Marland Kitchen EPINEPHrine 0.3 mg/0.3 mL IJ SOAJ injection Inject 0.3 mg into the muscle daily as needed for anaphylaxis.  . Estradiol 10 MCG TABS vaginal tablet Place 10 mcg vaginally 2 (two) times a week.  Marland Kitchen FLUoxetine (PROZAC) 20 MG capsule Take 2 capsules (40 mg total) by mouth daily.  . fluticasone (FLONASE) 50 MCG/ACT nasal spray Place 2 sprays into both nostrils daily as needed for allergies.  Marland Kitchen glucose blood (ONETOUCH VERIO) test strip 1 each by Other route daily. And lancets 1/day 250.00  . hydrOXYzine (ATARAX/VISTARIL) 10 MG tablet Take 1 tablet (10 mg total) by mouth every 6 (six) hours as needed for itching.  Marland Kitchen ibuprofen (ADVIL,MOTRIN) 600 MG tablet 1  po  pc every 6 hours with food, for 5 days then as needed for pain  . loratadine (CLARITIN) 10 MG tablet Take 10 mg by mouth daily.  . metFORMIN (GLUCOPHAGE) 1000 MG tablet TAKE ONE TABLET BY MOUTH TWICE DAILY WITH A MEAL  . montelukast (SINGULAIR) 10 MG tablet Take 10 mg by mouth at bedtime.  . NON FORMULARY Inject into the skin once a week. Allergy shots  . PEG-KCl-NaCl-NaSulf-Na Asc-C (PLENVU) 140 g SOLR Use as directed for colonoscopy prep.[id number] NOB:[id number] EZMOQ:[id number]  . Risankizumab-rzaa (SKYRIZI [place]) Skyrizi  . simethicone (MYLICON) 80 MG chewable tablet Chew 1 tablet (80 mg total) by mouth 4 (four) times daily as needed for flatulence.  . triamcinolone ointment (KENALOG) 0.1 % Apply 1 application topically 2 (two) times daily as needed (for psoriasis).  . triamterene-hydrochlorothiazide (MAXZIDE-25) 37.5-25 MG tablet Take 1 tablet by mouth  daily.  . VENTOLIN HFA 108 (90 Base) MCG/ACT inhaler Inhale 1 puff into the lungs every 6 (six) hours as needed for wheezing or shortness of breath.   No facility-administered encounter medications on file as of 02/13/2021.   REVIEW OF SYSTEMS:  Gen: Denies fever, sweats or chills. No weight loss.   CV: Denies chest pain, palpitations or edema. Resp: Denies cough, shortness of breath of hemoptysis.  GI: See HPI. GU : Denies urinary burning, blood in urine, increased urinary frequency or incontinence. MS: Denies joint pain, muscles aches or weakness. Derm: Denies rash, itchiness, skin lesions or unhealing ulcers. Psych: + Anxiety and depression. Heme: Denies bruising, bleeding.  Neuro:  Denies headaches, dizziness or paresthesias. Endo:  + diabetes.   PHYSICAL EXAM: BP 120/80   Pulse 64   Ht 5' 4.5" (1.638 m)   Wt 183 lb (83 kg)   BMI 30.93 kg/m  General: 65 year old female in no acute distress. Head: Normocephalic and atraumatic. Eyes:  Sclerae non-icteric, conjunctive pink. Ears: Normal auditory acuity. Mouth: Dentition intact. No ulcers or lesions.  Neck: Supple, no lymphadenopathy or thyromegaly.  Lungs: Clear bilaterally to auscultation without wheezes, crackles or rhonchi. Heart: Regular rate and rhythm. No murmur, rub or gallop appreciated.  Abdomen: Soft, nontender.  Mild gaseous distention.  No masses. No hepatosplenomegaly. Normoactive bowel sounds x 4 quadrants.  Central lower abdominal scar intact. Diastasis recti.  Rectal: Deferred. Musculoskeletal: Symmetrical with no gross deformities. Skin: Warm and dry. No rash or lesions on visible extremities. Extremities: No edema. Neurological: Alert oriented x 4, no focal deficits.  Psychological:  Alert and cooperative. Normal mood and affect.  ASSESSMENT AND PLAN:  68.  65 year old female presents to schedule screening colonoscopy.  -Colonoscopy benefits and risks discussed including risk with sedation, risk of  bleeding, perforation and infection   2.  History of internal and external hemorrhoids with intermittent rectal bleeding once every few months, no rectal bleeding x 4 weeks.  -Benefiber 1 tablespoon once daily.  MiraLAX nightly as needed. -Apply a small amount of Desitin inside the anal opening and to the external anal area tid as needed for anal or hemorrhoidal irritation/bleeding.   3.  Diabetes mellitus type 2 -Encouraged weight loss, continue follow-up with PCP       CC:  Billie Ruddy, MD

## 2021-03-14 ENCOUNTER — Other Ambulatory Visit: Payer: Self-pay

## 2021-03-15 ENCOUNTER — Encounter: Payer: Self-pay | Admitting: Family Medicine

## 2021-03-15 ENCOUNTER — Ambulatory Visit (INDEPENDENT_AMBULATORY_CARE_PROVIDER_SITE_OTHER): Payer: Managed Care, Other (non HMO) | Admitting: Family Medicine

## 2021-03-15 VITALS — BP 130/88 | HR 79 | Temp 97.8°F | Wt 182.8 lb

## 2021-03-15 DIAGNOSIS — I10 Essential (primary) hypertension: Secondary | ICD-10-CM

## 2021-03-15 DIAGNOSIS — E1169 Type 2 diabetes mellitus with other specified complication: Secondary | ICD-10-CM

## 2021-03-15 LAB — POCT GLYCOSYLATED HEMOGLOBIN (HGB A1C): Hemoglobin A1C: 6.4 % — AB (ref 4.0–5.6)

## 2021-03-15 NOTE — Patient Instructions (Signed)
Preventing Diabetes Mellitus Complications You can help to prevent or slow down problems that are caused by diabetes (diabetes mellitus). Following your diabetes plan and taking care of yourself can reduce your risk of serious or life-threatening complications. What actions can I take to prevent diabetes complications? Diabetes management  Follow instructions from your health care providers about managing your diabetes. Your diabetes may be managed by a team of health care providers who can teach you how to care for yourself and can answer questions that you have.  Educate yourself about your condition so you can make healthy choices about eating and physical activity.  Know your target range for your blood sugar (glucose), and check your blood glucose level as often as told. Your health care provider will help you decide how often to check your blood glucose level depending on your treatment goals and how well you are meeting them.  Ask your health care provider if you should take low-dose aspirin daily and what dose is recommended for you. Taking low-dose aspirin daily is recommended to help prevent cardiovascular disease.   Controlling your blood pressure and cholesterol Your personal target blood pressure is determined based on:  Your age.  Your medicines.  How long you have had diabetes.  Any other medical conditions you have. To control your blood pressure:  Follow instructions from your health care provider about meal planning, exercise, and medicines.  Make sure your health care provider checks your blood pressure at every medical visit.  Monitor your blood pressure at home as told by your health care provider. To control your cholesterol:  Follow instructions from your health care provider about meal planning, exercise, and medicines.  Have your cholesterol checked at least once a year.  You may be prescribed medicine to lower cholesterol (statin). If you are not taking a  statin, ask your health care provider if you should be. Controlling your cholesterol may:  Help prevent heart disease and stroke. These are the most common health problems for people with diabetes.  Improve your blood flow.   Medical appointments and vaccines Schedule and keep yearly physical exams and eye exams. Your health care provider will tell you how often you need medical visits depending on your diabetes management plan. Keep all follow-up visits as told. This is important so possible problems can be identified early and complications can be avoided or treated.  Every visit with your health care provider should include measuring your: ? Weight. ? Blood pressure. ? Blood glucose control.  Your A1C (hemoglobin A1C) level should be checked: ? At least 2 times a year, if you are meeting your treatment goals. ? 4 times a year, if you are not meeting treatment goals or if your treatment goals have changed.  Your blood lipids (lipid profile) should be checked yearly. You should also be checked yearly for protein in your urine (urine microalbumin).  If you have type 1 diabetes, get an eye exam 3-5 years after you are diagnosed, and then once a year after your first exam.  If you have type 2 diabetes, get an eye exam as soon as you are diagnosed, and then once a year after your first exam. It is also important to keep your vaccines current. It is recommended that you receive:  A flu (influenza) vaccine every year.  A pneumonia (pneumococcal) vaccine and a hepatitis B vaccine. If you are age 68 or older, you may get the pneumonia vaccine as a series of two separate shots. Ask  your health care provider which other vaccines may be recommended. Lifestyle  Do not use any products that contain nicotine or tobacco, such as cigarettes, e-cigarettes, and chewing tobacco. If you need help quitting, ask your health care provider. By avoiding nicotine and tobacco: ? You will lower your risk for  heart attack, stroke, nerve disease, and kidney disease. ? Your cholesterol and blood pressure may improve. ? Your blood circulation will improve.  If you drink alcohol: ? Limit how much you use to:  0-1 drink a day for women who are not pregnant.  0-2 drinks a day for men. ? Be aware of how much alcohol is in your drink. In the [place] one drink equals one 12 oz bottle of beer (355 mL), one 5 oz glass of wine (148 mL), or one 11?2 oz glass of hard liquor (44 mL). Taking care of your feet Diabetes may cause you to have poor blood circulation to your legs and feet. Because of this, taking care of your feet is very important. Diabetes can cause:  The skin on the feet to get thinner, break more easily, and heal more slowly.  Nerve damage in your legs and feet, which results in decreased feeling. You may not notice minor injuries that could lead to serious problems. To avoid foot problems:  Check your skin and feet every day for cuts, bruises, redness, blisters, or sores.  Schedule a foot exam with your health care provider once every year. This exam includes: ? Inspecting the structure and skin of your feet. ? Checking the pulses and sensation in your feet.  Make sure that your health care provider performs a visual foot exam at every medical visit.   Taking care of your teeth People with poorly controlled diabetes are more likely to have gum (periodontal) disease. Diabetes can make periodontal diseases harder to control. If not treated, periodontal diseases can lead to tooth loss. To prevent this:  Brush your teeth twice a day.  Floss at least once a day.  Visit your dentist 2 times a year. Managing stress Living with diabetes can be stressful. When you are experiencing stress, your blood glucose may be affected in two ways:  Stress hormones may cause your blood glucose to rise.  You may be distracted from taking good care of yourself. Be aware of your stress level and make  changes to help you manage challenging situations. To lower your stress levels:  Consider joining a support group.  Do planned relaxation or meditation.  Do a hobby that you enjoy.  Maintain healthy relationships.  Exercise regularly.  Work with your health care provider or a mental health professional. Where to find more information  American Diabetes Association: www.diabetes.org  Association of Diabetes Care and Education Specialists: www.diabeteseducator.org Summary  You can take action to prevent or slow down problems that are caused by diabetes (diabetes mellitus). Following your diabetes plan and taking care of yourself can reduce your risk of serious or life-threatening complications.  Follow instructions from your health care providers about managing your diabetes. Your diabetes may be managed by a team of health care providers who can teach you how to care for yourself and can answer questions that you have.  Know your target range for your blood sugar (glucose), and check your blood glucose levels as often as told. Your health care provider will help you decide how often you should check your blood glucose level depending on your treatment goals and how well you are meeting  them.  Your health care provider will tell you how often you need medical visits depending on your diabetes management plan. Keep all follow-up visits as directed. This is important so possible problems can be identified early and complications can be avoided or treated. This information is not intended to replace advice given to you by your health care provider. Make sure you discuss any questions you have with your health care provider. Document Revised: 12/24/2019 Document Reviewed: 12/24/2019 Elsevier Patient Education  2021 [employer] Your Hypertension Hypertension, also called high blood pressure, is when the force of the blood pressing against the walls of the arteries is too strong.  Arteries are blood vessels that carry blood from your heart throughout your body. Hypertension forces the heart to work harder to pump blood and may cause the arteries to become narrow or stiff. Understanding blood pressure readings Your personal target blood pressure may vary depending on your medical conditions, your age, and other factors. A blood pressure reading includes a higher number over a lower number. Ideally, your blood pressure should be below 120/80. You should know that:  The first, or top, number is called the systolic pressure. It is a measure of the pressure in your arteries as your heart beats.  The second, or bottom number, is called the diastolic pressure. It is a measure of the pressure in your arteries as the heart relaxes. Blood pressure is classified into four stages. Based on your blood pressure reading, your health care provider may use the following stages to determine what type of treatment you need, if any. Systolic pressure and diastolic pressure are measured in a unit called mmHg. Normal  Systolic pressure: below 235.  Diastolic pressure: below 80. Elevated  Systolic pressure: 361-443.  Diastolic pressure: below 80. Hypertension stage 1  Systolic pressure: 154-008.  Diastolic pressure: 67-61. Hypertension stage 2  Systolic pressure: 950 or above.  Diastolic pressure: 90 or above. How can this condition affect me? Managing your hypertension is an important responsibility. Over time, hypertension can damage the arteries and decrease blood flow to important parts of the body, including the brain, heart, and kidneys. Having untreated or uncontrolled hypertension can lead to:  A heart attack.  A stroke.  A weakened blood vessel (aneurysm).  Heart failure.  Kidney damage.  Eye damage.  Metabolic syndrome.  Memory and concentration problems.  Vascular dementia. What actions can I take to manage this condition? Hypertension can be managed by  making lifestyle changes and possibly by taking medicines. Your health care provider will help you make a plan to bring your blood pressure within a normal range. Nutrition  Eat a diet that is high in fiber and potassium, and low in salt (sodium), added sugar, and fat. An example eating plan is called the Dietary Approaches to Stop Hypertension (DASH) diet. To eat this way: ? Eat plenty of fresh fruits and vegetables. Try to fill one-half of your plate at each meal with fruits and vegetables. ? Eat whole grains, such as whole-wheat pasta, brown rice, or whole-grain bread. Fill about one-fourth of your plate with whole grains. ? Eat low-fat dairy products. ? Avoid fatty cuts of meat, processed or cured meats, and poultry with skin. Fill about one-fourth of your plate with lean proteins such as fish, chicken without skin, beans, eggs, and tofu. ? Avoid pre-made and processed foods. These tend to be higher in sodium, added sugar, and fat.  Reduce your daily sodium intake. Most people with hypertension  should eat less than 1,500 mg of sodium a day.   Lifestyle  Work with your health care provider to maintain a healthy body weight or to lose weight. Ask what an ideal weight is for you.  Get at least 30 minutes of exercise that causes your heart to beat faster (aerobic exercise) most days of the week. Activities may include walking, swimming, or biking.  Include exercise to strengthen your muscles (resistance exercise), such as weight lifting, as part of your weekly exercise routine. Try to do these types of exercises for 30 minutes at least 3 days a week.  Do not use any products that contain nicotine or tobacco, such as cigarettes, e-cigarettes, and chewing tobacco. If you need help quitting, ask your health care provider.  Control any long-term (chronic) conditions you have, such as high cholesterol or diabetes.  Identify your sources of stress and find ways to manage stress. This may include  meditation, deep breathing, or making time for fun activities.   Alcohol use  Do not drink alcohol if: ? Your health care provider tells you not to drink. ? You are pregnant, may be pregnant, or are planning to become pregnant.  If you drink alcohol: ? Limit how much you use to:  0-1 drink a day for women.  0-2 drinks a day for men. ? Be aware of how much alcohol is in your drink. In the [place] one drink equals one 12 oz bottle of beer (355 mL), one 5 oz glass of wine (148 mL), or one 1 oz glass of hard liquor (44 mL). Medicines Your health care provider may prescribe medicine if lifestyle changes are not enough to get your blood pressure under control and if:  Your systolic blood pressure is 130 or higher.  Your diastolic blood pressure is 80 or higher. Take medicines only as told by your health care provider. Follow the directions carefully. Blood pressure medicines must be taken as told by your health care provider. The medicine does not work as well when you skip doses. Skipping doses also puts you at risk for problems. Monitoring Before you monitor your blood pressure:  Do not smoke, drink caffeinated beverages, or exercise within 30 minutes before taking a measurement.  Use the bathroom and empty your bladder (urinate).  Sit quietly for at least 5 minutes before taking measurements. Monitor your blood pressure at home as told by your health care provider. To do this:  Sit with your back straight and supported.  Place your feet flat on the floor. Do not cross your legs.  Support your arm on a flat surface, such as a table. Make sure your upper arm is at heart level.  Each time you measure, take two or three readings one minute apart and record the results. You may also need to have your blood pressure checked regularly by your health care provider.   General information  Talk with your health care provider about your diet, exercise habits, and other lifestyle factors  that may be contributing to hypertension.  Review all the medicines you take with your health care provider because there may be side effects or interactions.  Keep all visits as told by your health care provider. Your health care provider can help you create and adjust your plan for managing your high blood pressure. Where to find more information  National Heart, Lung, and Blood Institute: https://wilson-eaton.com/  American Heart Association: www.heart.org Contact a health care provider if:  You think you are having  a reaction to medicines you have taken.  You have repeated (recurrent) headaches.  You feel dizzy.  You have swelling in your ankles.  You have trouble with your vision. Get help right away if:  You develop a severe headache or confusion.  You have unusual weakness or numbness, or you feel faint.  You have severe pain in your chest or abdomen.  You vomit repeatedly.  You have trouble breathing. These symptoms may represent a serious problem that is an emergency. Do not wait to see if the symptoms will go away. Get medical help right away. Call your local emergency services (911 in the [place] Do not drive yourself to the hospital. Summary  Hypertension is when the force of blood pumping through your arteries is too strong. If this condition is not controlled, it may put you at risk for serious complications.  Your personal target blood pressure may vary depending on your medical conditions, your age, and other factors. For most people, a normal blood pressure is less than 120/80.  Hypertension is managed by lifestyle changes, medicines, or both.  Lifestyle changes to help manage hypertension include losing weight, eating a healthy, low-sodium diet, exercising more, stopping smoking, and limiting alcohol. This information is not intended to replace advice given to you by your health care provider. Make sure you discuss any questions you have with your health care  provider. Document Revised: 12/10/2019 Document Reviewed: 10/05/2019 Elsevier Patient Education  2021 [employer].

## 2021-03-15 NOTE — Progress Notes (Signed)
Subjective:    Patient ID: Brittany Taylor, female    DOB: 09/20/1956, 65 y.o.   MRN: [id number]  Chief Complaint  Patient presents with  . Follow-up    Follow up for A1c and BP    HPI Patient was seen today for follow-up on HTN and DM2.  Patient states BP has been good at home.  Taking triamterene-hydrochlorothiazide 37.5-25 mg daily.  Pt trying to increase her water intake and decrease her intake of diet sodas.  Patient also working on moving more.  Hoping to lose more weight.  Taking metformin 1000 mg twice daily for diabetes.  FSBS at home has been around 100.  Previously on Janumet but d/c'd 2/2 cost.  Colonoscopy scheduled for the end of next month.  Past Medical History:  Diagnosis Date  . Allergy-induced asthma    daily inhaler  . Anxiety   . Arthritis    hands  . Dental crown present   . Depression   . GERD (gastroesophageal reflux disease)   . High cholesterol   . Hypertension    states under control with med., has been on med. x 10 yr.  . Non-insulin dependent type 2 diabetes mellitus ([place])   . PONV (postoperative nausea and vomiting)    patient thinks related to Morphine  . Psoriasis    arms, bilateral leg, back, breast, neck, ears  . Stenosing tenosynovitis of finger of right hand 03/2015   right ring finger    Allergies  Allergen Reactions  . Ace Inhibitors Other (See Comments)    ANGIOEDEMA   . Morphine Itching and Nausea And Vomiting  . Ramipril Other (See Comments)    ANGIOEDEMA  . Mold Extract [Trichophyton] Itching and Cough    Throat itches  . Latex Rash    Used latex gloves with powder, rash occured  . Mercurochrome [Merbromin] Rash   Family History  Problem Relation Age of Onset  . Diabetes Mother   . Diabetes Father   . Stroke Father   . Hypertension Father   . Glaucoma Father    ROS General: Denies fever, chills, night sweats, changes in weight, changes in appetite HEENT: Denies headaches, ear pain, changes in vision, rhinorrhea, sore  throat CV: Denies CP, palpitations, SOB, orthopnea Pulm: Denies SOB, cough, wheezing GI: Denies abdominal pain, nausea, vomiting, diarrhea, constipation GU: Denies dysuria, hematuria, frequency, vaginal discharge Msk: Denies muscle cramps, joint pains Neuro: Denies weakness, numbness, tingling Skin: Denies rashes, bruising Psych: Denies depression, anxiety, hallucinations     Objective:    Blood pressure 130/88, pulse 79, temperature 97.8 F (36.6 C), temperature source Oral, weight 182 lb 12.8 oz (82.9 kg), SpO2 97 %.  Gen. Pleasant, well-nourished, in no distress, normal affect   HEENT: [place]/AT, face symmetric, conjunctiva clear, no scleral icterus, PERRLA, EOMI, nares patent without drainage Lungs: no accessory muscle use, CTAB, no wheezes or rales Cardiovascular: RRR, no m/r/g, no peripheral edema Musculoskeletal: No deformities, no cyanosis or clubbing, normal tone Neuro:  A&Ox3, CN II-XII intact, normal gait Skin:  Warm, no lesions/ rash   Wt Readings from Last 3 Encounters:  03/15/21 182 lb 12.8 oz (82.9 kg)  02/13/21 183 lb (83 kg)  01/15/21 183 lb 6.4 oz (83.2 kg)    Lab Results  Component Value Date   WBC 2.9 (L) 11/15/2020   HGB 12.1 11/15/2020   HCT 36.6 11/15/2020   PLT 309.0 11/15/2020   GLUCOSE 145 (H) 11/15/2020   CHOL 232 (H) 11/15/2020   TRIG  127.0 11/15/2020   HDL 70.60 11/15/2020   LDLCALC 136 (H) 11/15/2020   ALT 23 08/08/2017   AST 25 08/08/2017   NA 136 11/15/2020   K 3.8 11/15/2020   CL 97 11/15/2020   CREATININE 0.75 11/15/2020   BUN 15 11/15/2020   CO2 29 11/15/2020   TSH 1.40 11/15/2020   HGBA1C 7.8 (H) 11/15/2020   MICROALBUR <0.7 08/08/2017    Assessment/Plan:  Essential hypertension -Stable.  Typically lower during OFV's.  We will recheck. -Continue current meds triamterene-hydrochlorothiazide 37.5-25 mg daily -Lifestyle modifications -Continue checking BP at home and keeping a log to bring with you to clinic  Type 2 diabetes  mellitus with other specified complication, without long-term current use of insulin (HCC) -Hemoglobin A1c 7.8% on 11/15/2020 -Continue lifestyle modifications -Continue metformin 1000 mg twice daily -Diabetic retinopathy screen negative on 04/28/2020 -Foot exam up-to-date, done 11/15/2020 -Recheck hemoglobin A1c at this visit and obtain Umicroalbumin/creatinine - Plan: POC HgB A1c, Microalbumin/Creatinine Ratio, Urine  F/u in 3-4 months, sooner if needed  Grier Mitts, MD

## 2021-03-16 LAB — MICROALBUMIN / CREATININE URINE RATIO
Creatinine,U: 16.6 mg/dL
Microalb Creat Ratio: 4.2 mg/g (ref 0.0–30.0)
Microalb, Ur: <0.7 mg/dL (ref 0.0–1.9)

## 2021-03-21 NOTE — Progress Notes (Signed)
Spoke with patient, is aware. 

## 2021-03-27 ENCOUNTER — Encounter: Payer: Self-pay | Admitting: Family Medicine

## 2021-04-05 ENCOUNTER — Encounter: Payer: Self-pay | Admitting: Gastroenterology

## 2021-04-11 ENCOUNTER — Other Ambulatory Visit: Payer: Self-pay

## 2021-04-11 ENCOUNTER — Encounter: Payer: Self-pay | Admitting: Gastroenterology

## 2021-04-11 ENCOUNTER — Ambulatory Visit (AMBULATORY_SURGERY_CENTER): Payer: Managed Care, Other (non HMO) | Admitting: Gastroenterology

## 2021-04-11 VITALS — BP 135/87 | HR 73 | Temp 97.3°F | Resp 15 | Ht 64.5 in | Wt 183.0 lb

## 2021-04-11 DIAGNOSIS — K625 Hemorrhage of anus and rectum: Secondary | ICD-10-CM

## 2021-04-11 DIAGNOSIS — K5900 Constipation, unspecified: Secondary | ICD-10-CM | POA: Diagnosis not present

## 2021-04-11 DIAGNOSIS — K648 Other hemorrhoids: Secondary | ICD-10-CM | POA: Diagnosis not present

## 2021-04-11 MED ORDER — SODIUM CHLORIDE 0.9 % IV SOLN
500.0000 mL | Freq: Once | INTRAVENOUS | Status: DC
Start: 2021-04-11 — End: 2021-04-11

## 2021-04-11 NOTE — Progress Notes (Signed)
Report given to PACU, vss 

## 2021-04-11 NOTE — Op Note (Signed)
[facility] Patient Name: Brittany Taylor Procedure Date: 04/11/2021 3:54 PM MRN: [id number] Endoscopist: Remo Lipps P. Havery Moros , MD Age: 65 Referring MD:  Date of Birth: 01/27/1956 Gender: Female Account #: [id number] Procedure:                Colonoscopy Indications:              Rectal bleeding, history of constipation, screening Medicines:                Monitored Anesthesia Care Procedure:                Pre-Anesthesia Assessment:                           - Prior to the procedure, a History and Physical                            was performed, and patient medications and                            allergies were reviewed. The patient's tolerance of                            previous anesthesia was also reviewed. The risks                            and benefits of the procedure and the sedation                            options and risks were discussed with the patient.                            All questions were answered, and informed consent                            was obtained. Prior Anticoagulants: The patient has                            taken no previous anticoagulant or antiplatelet                            agents. ASA Grade Assessment: II - A patient with                            mild systemic disease. After reviewing the risks                            and benefits, the patient was deemed in                            satisfactory condition to undergo the procedure.                           After obtaining informed consent, the colonoscope  was passed under direct vision. Throughout the                            procedure, the patient's blood pressure, pulse, and                            oxygen saturations were monitored continuously. The                            Olympus PFC-[id number] (#[id number]) Colonoscope was                            introduced through the anus and advanced to the the                             cecum, identified by appendiceal orifice and                            ileocecal valve. The colonoscopy was performed                            without difficulty. The patient tolerated the                            procedure well. The quality of the bowel                            preparation was good. The ileocecal valve,                            appendiceal orifice, and rectum were photographed. Scope In: 3:58:13 PM Scope Out: 4:17:06 PM Scope Withdrawal Time: 0 hours 14 minutes 20 seconds  Total Procedure Duration: 0 hours 18 minutes 53 seconds  Findings:                 The perianal and digital rectal examinations were                            normal.                           Internal hemorrhoids were found during                            retroflexion. The hemorrhoids were moderate.                           The exam was otherwise without abnormality. No                            polyps Complications:            No immediate complications. Estimated blood loss:                            None. Estimated Blood Loss:     Estimated  blood loss: none. Impression:               - Internal hemorrhoids.                           - The examination was otherwise normal.                           - No polyps.                           Hemorrhoids are the cause of bleeding symptoms. Recommendation:           - Patient has a contact number available for                            emergencies. The signs and symptoms of potential                            delayed complications were discussed with the                            patient. Return to normal activities tomorrow.                            Written discharge instructions were provided to the                            patient.                           - Resume previous diet.                           - Continue present medications.                           - Start MIralax once daily, titrate up or down as                             needed                           - Follow up in the clinic if interested in                            hemorrhoid banding treatment, will discuss with the                            patient                           - Repeat colonoscopy in 10 years for screening                            purposes. Remo Lipps P. Jozey Janco, MD 04/11/2021 4:22:30 PM This report has been signed electronically.

## 2021-04-11 NOTE — Progress Notes (Signed)
Pt's states no medical or surgical changes since previsit or office visit. 

## 2021-04-11 NOTE — Patient Instructions (Signed)
Handouts provided on hemorrhoids and hemorrhoid banding.   Start MiraLax once daily, titrate up or down as needed.   Repeat colonoscopy in 10 years for screening purposes.   YOU HAD AN ENDOSCOPIC PROCEDURE TODAY AT [facility]:   Refer to the procedure report that was given to you for any specific questions about what was found during the examination.  If the procedure report does not answer your questions, please call your gastroenterologist to clarify.  If you requested that your care partner not be given the details of your procedure findings, then the procedure report has been included in a sealed envelope for you to review at your convenience later.  YOU SHOULD EXPECT: Some feelings of bloating in the abdomen. Passage of more gas than usual.  Walking can help get rid of the air that was put into your GI tract during the procedure and reduce the bloating. If you had a lower endoscopy (such as a colonoscopy or flexible sigmoidoscopy) you may notice spotting of blood in your stool or on the toilet paper. If you underwent a bowel prep for your procedure, you may not have a normal bowel movement for a few days.  Please Note:  You might notice some irritation and congestion in your nose or some drainage.  This is from the oxygen used during your procedure.  There is no need for concern and it should clear up in a day or so.  SYMPTOMS TO REPORT IMMEDIATELY:   Following lower endoscopy (colonoscopy or flexible sigmoidoscopy):  Excessive amounts of blood in the stool  Significant tenderness or worsening of abdominal pains  Swelling of the abdomen that is new, acute  Fever of 100F or higher  For urgent or emergent issues, a gastroenterologist can be reached at any hour by calling [phone]. Do not use MyChart messaging for urgent concerns.    DIET:  We do recommend a small meal at first, but then you may proceed to your regular diet.  Drink plenty of fluids but you should  avoid alcoholic beverages for 24 hours.  ACTIVITY:  You should plan to take it easy for the rest of today and you should NOT DRIVE or use heavy machinery until tomorrow (because of the sedation medicines used during the test).    FOLLOW UP: Our staff will call the number listed on your records 48-72 hours following your procedure to check on you and address any questions or concerns that you may have regarding the information given to you following your procedure. If we do not reach you, we will leave a message.  We will attempt to reach you two times.  During this call, we will ask if you have developed any symptoms of COVID 19. If you develop any symptoms (ie: fever, flu-like symptoms, shortness of breath, cough etc.) before then, please call [phone].  If you test positive for Covid 19 in the 2 weeks post procedure, please call and report this information to [place].    If any biopsies were taken you will be contacted by phone or by letter within the next 1-3 weeks.  Please call us at [phone] if you have not heard about the biopsies in 3 weeks.    SIGNATURES/CONFIDENTIALITY: You and/or your care partner have signed paperwork which will be entered into your electronic medical record.  These signatures attest to the fact that that the information above on your After Visit Summary has been reviewed and is understood.  Full responsibility of  the confidentiality of this discharge information lies with you and/or your care-partner.

## 2021-05-02 LAB — HM DIABETES EYE EXAM

## 2021-05-04 ENCOUNTER — Encounter: Payer: Self-pay | Admitting: Family Medicine

## 2021-05-06 ENCOUNTER — Other Ambulatory Visit: Payer: Self-pay | Admitting: Family Medicine

## 2021-05-06 DIAGNOSIS — E119 Type 2 diabetes mellitus without complications: Secondary | ICD-10-CM

## 2021-08-22 ENCOUNTER — Other Ambulatory Visit: Payer: Self-pay | Admitting: Family Medicine

## 2021-08-22 DIAGNOSIS — E119 Type 2 diabetes mellitus without complications: Secondary | ICD-10-CM

## 2021-09-07 ENCOUNTER — Encounter: Payer: Self-pay | Admitting: Family Medicine

## 2021-09-07 ENCOUNTER — Telehealth: Payer: Managed Care, Other (non HMO) | Admitting: Family Medicine

## 2021-09-07 DIAGNOSIS — J4540 Moderate persistent asthma, uncomplicated: Secondary | ICD-10-CM | POA: Diagnosis not present

## 2021-09-07 DIAGNOSIS — U071 COVID-19: Secondary | ICD-10-CM

## 2021-09-07 DIAGNOSIS — R051 Acute cough: Secondary | ICD-10-CM | POA: Diagnosis not present

## 2021-09-07 MED ORDER — ALBUTEROL SULFATE HFA 108 (90 BASE) MCG/ACT IN AERS: 2.0000 | INHALATION_SPRAY | Freq: Four times a day (QID) | RESPIRATORY_TRACT | 0 refills | Status: AC | PRN

## 2021-09-07 MED ORDER — FLUTICASONE PROPIONATE 50 MCG/ACT NA SUSP: 2.0000 | Freq: Every day | NASAL | 1 refills | Status: AC

## 2021-09-07 MED ORDER — BENZONATATE 100 MG PO CAPS
100.0000 mg | ORAL_CAPSULE | Freq: Two times a day (BID) | ORAL | 0 refills | Status: DC | PRN
Start: 2021-09-07 — End: 2023-09-29

## 2021-09-07 MED ORDER — PROMETHAZINE-DM 6.25-15 MG/5ML PO SYRP: 2.5000 mL | ORAL_SOLUTION | Freq: Three times a day (TID) | ORAL | 0 refills | Status: DC | PRN

## 2021-09-07 NOTE — Patient Instructions (Signed)
I appreciate the opportunity to provide you with care for your health and wellness.  Take medication as directed  Work note to return on Monday  Please continue to practice social distancing to keep you, your family, and our community safe. If you must go out, please wear a mask and practice good handwashing.  Have a wonderful day. With [employer]at[employer], Cherly Beach, DNP, AGNP-BC   Please keep well-hydrated and get plenty of rest. Start a saline nasal rinse to flush out your nasal passages. You can use plain Mucinex to help thin congestion. If you have a humidifier, running in the bedroom at night. I want you to start OTC vitamin D3 1000 units daily, vitamin C 1000 mg daily, and a zinc supplement. Please take prescribed medications as directed.  You have been enrolled in a MyChart symptom monitoring program. Please answer these questions daily so we can keep track of how you are doing.  You were to quarantine for 5 days from onset of your symptoms.  After day 5, if you have had no fever and you are feeling better, you can end quarantine but need to mask for an additional 5 days. After day 5 if you have a fever or are having significant symptoms, please quarantine for full 10 days.  If you note any worsening of symptoms, any significant shortness of breath or any chest pain, please seek ER evaluation ASAP.  Please do not delay care!  COVID-19: What to Do if You Are Sick If you test positive and are an older adult or someone who is at high risk of getting very sick from COVID-19, treatment may be available. Contact a healthcare provider right away after a positive test to determine if you are eligible, even if your symptoms are mild right now. You can also visit a Test to Treat location and, if eligible, receive a prescription from a provider. Don't delay: Treatment must be started within the first few days to be effective. If you have a fever, cough, or other symptoms, you might have  COVID-19. Most people have mild illness and are able to recover at home. If you are sick: Keep track of your symptoms. If you have an emergency warning sign (including trouble breathing), call 911. Steps to help prevent the spread of COVID-19 if you are sick If you are sick with COVID-19 or think you might have COVID-19, follow the steps below to care for yourself and to help protect other people in your home and community. Stay home except to get medical care Stay home. Most people with COVID-19 have mild illness and can recover at home without medical care. Do not leave your home, except to get medical care. Do not visit public areas and do not go to places where you are unable to wear a mask. Take care of yourself. Get rest and stay hydrated. Take over-the-counter medicines, such as acetaminophen, to help you feel better. Stay in touch with your doctor. Call before you get medical care. Be sure to get care if you have trouble breathing, or have any other emergency warning signs, or if you think it is an emergency. Avoid public transportation, ride-sharing, or taxis if possible. Get tested If you have symptoms of COVID-19, get tested. While waiting for test results, stay away from others, including staying apart from those living in your household. Get tested as soon as possible after your symptoms start. Treatments may be available for people with COVID-19 who are at risk for becoming very sick.  Don't delay: Treatment must be started early to be effective--some treatments must begin within 5 days of your first symptoms. Contact your healthcare provider right away if your test result is positive to determine if you are eligible. Self-tests are one of several options for testing for the virus that causes COVID-19 and may be more convenient than laboratory-based tests and point-of-care tests. Ask your healthcare provider or your local health department if you need help interpreting your test  results. You can visit your state, tribal, local, and territorial health department's website to look for the latest local information on testing sites. Separate yourself from other people As much as possible, stay in a specific room and away from other people and pets in your home. If possible, you should use a separate bathroom. If you need to be around other people or animals in or outside of the home, wear a well-fitting mask. Tell your close contacts that they may have been exposed to COVID-19. An infected person can spread COVID-19 starting 48 hours (or 2 days) before the person has any symptoms or tests positive. By letting your close contacts know they may have been exposed to COVID-19, you are helping to protect everyone. See COVID-19 and Animals if you have questions about pets. If you are diagnosed with COVID-19, someone from the health department may call you. Answer the call to slow the spread. Monitor your symptoms Symptoms of COVID-19 include fever, cough, or other symptoms. Follow care instructions from your healthcare provider and local health department. Your local health authorities may give instructions on checking your symptoms and reporting information. When to seek emergency medical attention Look for emergency warning signs* for COVID-19. If someone is showing any of these signs, seek emergency medical care immediately: Trouble breathing Persistent pain or pressure in the chest New confusion Inability to wake or stay awake Pale, gray, or blue-colored skin, lips, or nail beds, depending on skin tone *This list is not all possible symptoms. Please call your medical provider for any other symptoms that are severe or concerning to you. Call 911 or call ahead to your local emergency facility: Notify the operator that you are seeking care for someone who has or may have COVID-19. Call ahead before visiting your doctor Call ahead. Many medical visits for routine care are being  postponed or done by phone or telemedicine. If you have a medical appointment that cannot be postponed, call your doctor's office, and tell them you have or may have COVID-19. This will help the office protect themselves and other patients. If you are sick, wear a well-fitting mask You should wear a mask if you must be around other people or animals, including pets (even at home). Wear a mask with the best fit, protection, and comfort for you. You don't need to wear the mask if you are alone. If you can't put on a mask (because of trouble breathing, for example), cover your coughs and sneezes in some other way. Try to stay at least 6 feet away from other people. This will help protect the people around you. Masks should not be placed on young children under age 72 years, anyone who has trouble breathing, or anyone who is not able to remove the mask without help. Cover your coughs and sneezes Cover your mouth and nose with a tissue when you cough or sneeze. Throw away used tissues in a lined trash can. Immediately wash your hands with soap and water for at least 20 seconds. If soap and  water are not available, clean your hands with an alcohol-based hand sanitizer that contains at least 60% alcohol. Clean your hands often Wash your hands often with soap and water for at least 20 seconds. This is especially important after blowing your nose, coughing, or sneezing; going to the bathroom; and before eating or preparing food. Use hand sanitizer if soap and water are not available. Use an alcohol-based hand sanitizer with at least 60% alcohol, covering all surfaces of your hands and rubbing them together until they feel dry. Soap and water are the best option, especially if hands are visibly dirty. Avoid touching your eyes, nose, and mouth with unwashed hands. Handwashing Tips Avoid sharing personal household items Do not share dishes, drinking glasses, cups, eating utensils, towels, or bedding with other  people in your home. Wash these items thoroughly after using them with soap and water or put in the dishwasher. Clean surfaces in your home regularly Clean and disinfect high-touch surfaces (for example, doorknobs, tables, handles, light switches, and countertops) in your "sick room" and bathroom. In shared spaces, you should clean and disinfect surfaces and items after each use by the person who is ill. If you are sick and cannot clean, a caregiver or other person should only clean and disinfect the area around you (such as your bedroom and bathroom) on an as needed basis. Your caregiver/other person should wait as long as possible (at least several hours) and wear a mask before entering, cleaning, and disinfecting shared spaces that you use. Clean and disinfect areas that may have blood, stool, or body fluids on them. Use household cleaners and disinfectants. Clean visible dirty surfaces with household cleaners containing soap or detergent. Then, use a household disinfectant. Use a product from [employer] List N: Disinfectants for Coronavirus (OINOM-76). Be sure to follow the instructions on the label to ensure safe and effective use of the product. Many products recommend keeping the surface wet with a disinfectant for a certain period of time (look at "contact time" on the product label). You may also need to wear personal protective equipment, such as gloves, depending on the directions on the product label. Immediately after disinfecting, wash your hands with soap and water for 20 seconds. For completed guidance on cleaning and disinfecting your home, visit Complete Disinfection Guidance. Take steps to improve ventilation at home Improve ventilation (air flow) at home to help prevent from spreading COVID-19 to other people in your household. Clear out COVID-19 virus particles in the air by opening windows, using air filters, and turning on fans in your home. Use this interactive tool to learn how to  improve air flow in your home. When you can be around others after being sick with COVID-19 Deciding when you can be around others is different for different situations. Find out when you can safely end home isolation. For any additional questions about your care, contact your healthcare provider or state or local health department. 02/06/2021 Content source: [facility] ([facility]), Division of Viral Diseases This information is not intended to replace advice given to you by your health care provider. Make sure you discuss any questions you have with your health care provider. Document Revised: 03/22/2021 Document Reviewed: 03/22/2021 Else[employer] Patient Education  [place].

## 2021-09-07 NOTE — Progress Notes (Signed)
Virtual Visit Consent   Brittany Taylor, you are scheduled for a virtual visit with a Center provider today.     Just as with appointments in the office, your consent must be obtained to participate.  Your consent will be active for this visit and any virtual visit you may have with one of our providers in the next 365 days.     If you have a MyChart account, a copy of this consent can be sent to you electronically.  All virtual visits are billed to your insurance company just like a traditional visit in the office.    As this is a virtual visit, video technology does not allow for your provider to perform a traditional examination.  This may limit your provider's ability to fully assess your condition.  If your provider identifies any concerns that need to be evaluated in person or the need to arrange testing (such as labs, EKG, etc.), we will make arrangements to do so.     Although advances in technology are sophisticated, we cannot ensure that it will always work on either your end or our end.  If the connection with a video visit is poor, the visit may have to be switched to a telephone visit.  With either a video or telephone visit, we are not always able to ensure that we have a secure connection.     I need to obtain your verbal consent now.   Are you willing to proceed with your visit today?    [name] has provided verbal consent on 09/07/2021 for a virtual visit (video or telephone).   Perlie Mayo, NP   Date: 09/07/2021 12:03 PM   Virtual Visit via Video Note   I, Perlie Mayo, connected with  Brittany Taylor  ([id number], November 15, 1956) on 09/07/21 at 12:45 PM EDT by a video-enabled telemedicine application and verified that I am speaking with the correct person using two identifiers.  Location: Patient: Virtual Visit Location Patient: Home Provider: Virtual Visit Location Provider: Home Office   I discussed the limitations of evaluation and management by  telemedicine and the availability of in person appointments. The patient expressed understanding and agreed to proceed.    History of Present Illness: Brittany Taylor is a 65 y.o. who identifies as a female who was assigned female at birth, and is being seen today for + home test for covid. Symptoms started last weekend. Just was not feeling well. Was stuffy- congestion, took some allergy meds- claritin D on Wednesday- no relief really. Have a mild cough- spasming during cough. 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 Reports shortness of breath with coughing. Mild sore throat has improved. Coughing up thick clear mucus.  Reports hx of asthma- controlled- does not feel like it is flared up.  Denies fever, chills, chest pain, ear pain.  No known direct exposure, but some people at school are covid +.  Covid vaccination x 4 (2 of which are boosters)   Problems:  Patient Active Problem List   Diagnosis Date Noted   Fibroid, uterine 03/23/2018   Trigger finger, acquired 02/07/2015   Routine general medical examination at a health care facility 08/09/2014   Anemia, unspecified 07/24/2013   Nonspecific abnormal electrocardiogram (ECG) (EKG) 07/23/2013   Psoriasis 07/14/2012   Menopausal state 07/14/2012   Encounter for long-term (current) use of other medications 06/17/2011   GOITER, MULTINODULAR 03/06/2009   LEUKOPENIA, CHRONIC 03/06/2009   Asymptomatic menopausal  state 03/06/2009   TRIGGER FINGER, RIGHT THUMB 02/25/2008   HSV 08/22/2007   Diabetes ([place]) 08/22/2007   HYPERLIPIDEMIA 08/22/2007   DEPRESSION 08/22/2007   ALLERGIC RHINITIS 08/22/2007   ANGIOEDEMA 08/22/2007    Allergies:  Allergies  Allergen Reactions   Ace Inhibitors Other (See Comments)    ANGIOEDEMA    Morphine Itching and Nausea And Vomiting   Ramipril Other (See Comments)    ANGIOEDEMA   Mold Extract [Trichophyton] Itching and Cough    Throat itches   Latex Rash    Used latex gloves with  powder, rash occured   Mercurochrome [Merbromin] Rash   Medications:  Current Outpatient Medications:    ADVAIR DISKUS 250-50 MCG/ACT AEPB, Inhale 1 puff into the lungs 2 (two) times daily., Disp: , Rfl:    aspirin 81 MG tablet, Take 162 mg by mouth daily. , Disp: , Rfl:    azelastine (OPTIVAR) 0.05 % ophthalmic solution, Place 1 drop into both eyes 2 (two) times daily as needed (for itchy/allergy eyes). , Disp: , Rfl:    budesonide-formoterol (SYMBICORT) 160-4.5 MCG/ACT inhaler, Inhale 1-2 puffs into the lungs 2 (two) times daily. (Patient not taking: Reported on 04/11/2021), Disp: 1 Inhaler, Rfl: 3   docusate sodium (COLACE) 100 MG capsule, Take 1 capsule (100 mg total) by mouth 2 (two) times daily., Disp: 10 capsule, Rfl: 0   EPINEPHrine 0.3 mg/0.3 mL IJ SOAJ injection, Inject 0.3 mg into the muscle daily as needed for anaphylaxis. (Patient not taking: No sig reported), Disp: , Rfl: 0   Estradiol 10 MCG TABS vaginal tablet, Place 10 mcg vaginally 2 (two) times a week., Disp: , Rfl:    FLUoxetine (PROZAC) 20 MG capsule, Take 2 capsules (40 mg total) by mouth daily., Disp: 180 capsule, Rfl: 3   fluticasone (FLONASE) 50 MCG/ACT nasal spray, Place 2 sprays into both nostrils daily as needed for allergies., Disp: , Rfl:    glucose blood (ONETOUCH VERIO) test strip, 1 each by Other route daily. And lancets 1/day 250.00, Disp: 100 each, Rfl: 12   hydrOXYzine (ATARAX/VISTARIL) 10 MG tablet, Take 1 tablet (10 mg total) by mouth every 6 (six) hours as needed for itching., Disp: 90 tablet, Rfl: 2   ibuprofen (ADVIL,MOTRIN) 600 MG tablet, 1  po  pc every 6 hours with food, for 5 days then as needed for pain, Disp: 30 tablet, Rfl: 1   loratadine (CLARITIN) 10 MG tablet, Take 10 mg by mouth daily., Disp: , Rfl:    metFORMIN (GLUCOPHAGE) 1000 MG tablet, TAKE ONE TABLET BY MOUTH TWICE A DAY WITH MEALS, Disp: 180 tablet, Rfl: 0   montelukast (SINGULAIR) 10 MG tablet, Take 10 mg by mouth at bedtime., Disp: , Rfl:  0   NON FORMULARY, Inject into the skin once a week. Allergy shots (Patient not taking: Reported on 04/11/2021), Disp: , Rfl:    Risankizumab-rzaa (SKYRIZI ), Skyrizi, Disp: , Rfl:    simethicone (MYLICON) 80 MG chewable tablet, Chew 1 tablet (80 mg total) by mouth 4 (four) times daily as needed for flatulence., Disp: 30 tablet, Rfl: 0   triamcinolone ointment (KENALOG) 0.1 %, Apply 1 application topically 2 (two) times daily as needed (for psoriasis)., Disp: 30 g, Rfl: 3   triamterene-hydrochlorothiazide (MAXZIDE-25) 37.5-25 MG tablet, Take 1 tablet by mouth daily., Disp: 90 tablet, Rfl: 3   VENTOLIN HFA 108 (90 Base) MCG/ACT inhaler, Inhale 1 puff into the lungs every 6 (six) hours as needed for wheezing or shortness of breath., Disp: ,  Rfl: 0  Observations/Objective: Patient is well-developed, well-nourished in no acute distress.  Resting comfortably  at home.  Head is normocephalic, atraumatic.  No labored breathing.  Speech is clear and coherent with logical content.  Patient is alert and oriented at baseline.  Cough, hoarseness   Assessment and Plan: 1. COVID-19 -+ covid test -flonase, perles, and inhaler provided for smyptom management  -out of window for antivirals   - fluticasone (FLONASE) 50 MCG/ACT nasal spray; Place 2 sprays into both nostrils daily.  Dispense: 16 g; Refill: 1 - promethazine-dextromethorphan (PROMETHAZINE-DM) 6.25-15 MG/5ML syrup; Take 2.5 mLs by mouth 3 (three) times daily as needed for cough.  Dispense: 118 mL; Refill: 0 - benzonatate (TESSALON) 100 MG capsule; Take 1 capsule (100 mg total) by mouth 2 (two) times daily as needed for cough.  Dispense: 20 capsule; Refill: 0 - albuterol (VENTOLIN HFA) 108 (90 Base) MCG/ACT inhaler; Inhale 2 puffs into the lungs every 6 (six) hours as needed for wheezing or shortness of breath.  Dispense: 8 g; Refill: 0  2. Acute cough Cough is worse symptom Prom Dm provided- side effects discussed Additionally ordered  inhaler and perles -OTC measures reviewed and on AVS   - promethazine-dextromethorphan (PROMETHAZINE-DM) 6.25-15 MG/5ML syrup; Take 2.5 mLs by mouth 3 (three) times daily as needed for cough.  Dispense: 118 mL; Refill: 0 - benzonatate (TESSALON) 100 MG capsule; Take 1 capsule (100 mg total) by mouth 2 (two) times daily as needed for cough.  Dispense: 20 capsule; Refill: 0 - albuterol (VENTOLIN HFA) 108 (90 Base) MCG/ACT inhaler; Inhale 2 puffs into the lungs every 6 (six) hours as needed for wheezing or shortness of breath.  Dispense: 8 g; Refill: 0   3. Moderate persistent asthma without complication -controlled -Continue current treatments.    Reviewed side effects, risks and benefits of medication.    Patient acknowledged agreement and understanding of the plan.  I discussed the assessment and treatment plan with the patient. The patient was provided an opportunity to ask questions and all were answered. The patient agreed with the plan and demonstrated an understanding of the instructions.   The patient was advised to call back or seek an in-person evaluation if the symptoms worsen or if the condition fails to improve as anticipated.   The above assessment and management plan was discussed with the patient. The patient verbalized understanding of and has agreed to the management plan. Patient is aware to call the clinic if symptoms persist or worsen. Patient is aware when to return to the clinic for a follow-up visit. Patient educated on when it is appropriate to go to the emergency department.    Follow Up Instructions: I discussed the assessment and treatment plan with the patient. The patient was provided an opportunity to ask questions and all were answered. The patient agreed with the plan and demonstrated an understanding of the instructions.  A copy of instructions were sent to the patient via MyChart unless otherwise noted below.     The patient was advised to call back or  seek an in-person evaluation if the symptoms worsen or if the condition fails to improve as anticipated.  Time:  I spent 10 minutes with the patient via telehealth technology discussing the above problems/concerns.    Perlie Mayo, NP

## 2021-12-31 ENCOUNTER — Other Ambulatory Visit: Payer: Self-pay | Admitting: Family Medicine

## 2021-12-31 DIAGNOSIS — E119 Type 2 diabetes mellitus without complications: Secondary | ICD-10-CM

## 2021-12-31 NOTE — Telephone Encounter (Signed)
Patient need to schedule an ov for more refills. 

## 2022-01-07 ENCOUNTER — Ambulatory Visit: Payer: No Typology Code available for payment source | Admitting: Family Medicine

## 2022-01-10 ENCOUNTER — Ambulatory Visit: Payer: No Typology Code available for payment source | Admitting: Family Medicine

## 2022-01-11 ENCOUNTER — Telehealth (INDEPENDENT_AMBULATORY_CARE_PROVIDER_SITE_OTHER): Payer: Managed Care, Other (non HMO) | Admitting: Family Medicine

## 2022-01-11 ENCOUNTER — Encounter: Payer: Self-pay | Admitting: Family Medicine

## 2022-01-11 DIAGNOSIS — M25569 Pain in unspecified knee: Secondary | ICD-10-CM

## 2022-01-11 DIAGNOSIS — I10 Essential (primary) hypertension: Secondary | ICD-10-CM

## 2022-01-11 DIAGNOSIS — E119 Type 2 diabetes mellitus without complications: Secondary | ICD-10-CM | POA: Diagnosis not present

## 2022-01-11 DIAGNOSIS — F339 Major depressive disorder, recurrent, unspecified: Secondary | ICD-10-CM | POA: Diagnosis not present

## 2022-01-11 MED ORDER — TRIAMTERENE-HCTZ 37.5-25 MG PO TABS: 1.0000 | ORAL_TABLET | Freq: Every day | ORAL | 3 refills | Status: DC

## 2022-01-11 MED ORDER — FLUOXETINE HCL 20 MG PO CAPS: 40.0000 mg | ORAL_CAPSULE | Freq: Every day | ORAL | 3 refills | Status: DC

## 2022-01-11 MED ORDER — METFORMIN HCL 1000 MG PO TABS: ORAL_TABLET | ORAL | 3 refills | Status: DC

## 2022-01-11 NOTE — Progress Notes (Signed)
Virtual Visit via Video Note  I connected with Brittany Taylor on 01/11/22 at  3:30 PM EST by a video enabled telemedicine application 2/2 [id number] pandemic and verified that I am speaking with the correct person using two identifiers.  Location patient: home Location provider:work or home office Persons participating in the virtual visit: patient, provider  I discussed the limitations of evaluation and management by telemedicine and the availability of in person appointments. The patient expressed understanding and agreed to proceed.  Chief Complaint  Patient presents with   Medication Refill    Metformin- pharmacy informed her she could not get a refill.    HPI: Is a 66 year old female with pmh sig for HTN, GERD, DM 2, psoriasis, HSV, HLD, h/o anxiety, h/o depression who is seen for med refills and follow-up.  Pt states her A1C was 7% at a screening at her job.  FSBS was 142 this am.  Pt needs a refill on Metformin 1000 mg BID, out since Saturday.  Pt counting carbs. Walking some for exercise.  Pt seen by Dermatology for psoriasis.  On skyrizi.  A referral was placed to Rheumatology as pt starting to have stiffness in fingers in am.    Pt mentions falling at work last month.  Was seen by Workmen's Comp. provider.  No imaging obtained at the time of injury.  Patient now having mild edema and discomfort in knee with going up stairs.  States feels like a "pulling sensation".  ROS: See pertinent positives and negatives per HPI.  Past Medical History:  Diagnosis Date   Allergy-induced asthma    daily inhaler   Anxiety    Arthritis    hands   Dental crown present    Depression    GERD (gastroesophageal reflux disease)    High cholesterol    Hypertension    states under control with med., has been on med. x 10 yr.   Non-insulin dependent type 2 diabetes mellitus (HCC)    PONV (postoperative nausea and vomiting)    patient thinks related to Morphine   Psoriasis    arms, bilateral  leg, back, breast, neck, ears   Stenosing tenosynovitis of finger of right hand 03/2015   right ring finger    Past Surgical History:  Procedure Laterality Date   BREAST REDUCTION SURGERY  2000   CYSTOSCOPY N/A 03/23/2018   Procedure: CYSTOSCOPY;  Surgeon: Delsa Bern, MD;  Location: [facility];  Service: Gynecology;  Laterality: N/A;   LEEP  03/18/2003   MYOMECTOMY  1995   POLYPECTOMY  01/2015   uterine   REDUCTION MAMMAPLASTY     2000   SUPRACERVICAL ABDOMINAL HYSTERECTOMY Bilateral 03/23/2018   Procedure: HYSTERECTOMY SUPRACERVICAL ABDOMINAL;  Surgeon: Delsa Bern, MD;  Location: [facility];  Service: Gynecology;  Laterality: Bilateral;   TRIGGER FINGER RELEASE Left 06/15/2009   thumb   TRIGGER FINGER RELEASE Right 11/15/2008   thumb   TRIGGER FINGER RELEASE Right 04/13/2015   Procedure: RELEASE A-1 PULLEY RIGHT RING FINGER;  Surgeon: Daryll Brod, MD;  Location: [facility];  Service: Orthopedics;  Laterality: Right;   WISDOM TOOTH EXTRACTION  1972    Family History  Problem Relation Age of Onset   Diabetes Mother    Diabetes Father    Stroke Father    Hypertension Father    Glaucoma Father    Colon cancer Neg Hx    Esophageal cancer Neg Hx    Rectal cancer Neg Hx    Stomach cancer Neg  Hx     Current Outpatient Medications:    ADVAIR DISKUS 250-50 MCG/ACT AEPB, Inhale 1 puff into the lungs 2 (two) times daily., Disp: , Rfl:    albuterol (VENTOLIN HFA) 108 (90 Base) MCG/ACT inhaler, Inhale 2 puffs into the lungs every 6 (six) hours as needed for wheezing or shortness of breath., Disp: 8 g, Rfl: 0   aspirin 81 MG tablet, Take 162 mg by mouth daily. , Disp: , Rfl:    azelastine (OPTIVAR) 0.05 % ophthalmic solution, Place 1 drop into both eyes 2 (two) times daily as needed (for itchy/allergy eyes). , Disp: , Rfl:    docusate sodium (COLACE) 100 MG capsule, Take 1 capsule (100 mg total) by mouth 2 (two) times daily., Disp: 10 capsule, Rfl: 0   Estradiol 10 MCG TABS  vaginal tablet, Place 10 mcg vaginally 2 (two) times a week., Disp: , Rfl:    fluticasone (FLONASE) 50 MCG/ACT nasal spray, Place 2 sprays into both nostrils daily., Disp: 16 g, Rfl: 1   glucose blood (ONETOUCH VERIO) test strip, 1 each by Other route daily. And lancets 1/day 250.00, Disp: 100 each, Rfl: 12   hydrOXYzine (ATARAX/VISTARIL) 10 MG tablet, Take 1 tablet (10 mg total) by mouth every 6 (six) hours as needed for itching., Disp: 90 tablet, Rfl: 2   ibuprofen (ADVIL,MOTRIN) 600 MG tablet, 1  po  pc every 6 hours with food, for 5 days then as needed for pain, Disp: 30 tablet, Rfl: 1   loratadine (CLARITIN) 10 MG tablet, Take 10 mg by mouth daily., Disp: , Rfl:    metFORMIN (GLUCOPHAGE) 1000 MG tablet, TAKE ONE TABLET BY MOUTH TWICE A DAY WITH MEALS, Disp: 180 tablet, Rfl: 0   montelukast (SINGULAIR) 10 MG tablet, Take 10 mg by mouth at bedtime., Disp: , Rfl: 0   NON FORMULARY, Inject into the skin once a week. Allergy shots, Disp: , Rfl:    Risankizumab-rzaa (SKYRIZI [place]), Skyrizi, Disp: , Rfl:    triamcinolone ointment (KENALOG) 0.1 %, Apply 1 application topically 2 (two) times daily as needed (for psoriasis)., Disp: 30 g, Rfl: 3   triamterene-hydrochlorothiazide (MAXZIDE-25) 37.5-25 MG tablet, Take 1 tablet by mouth daily., Disp: 90 tablet, Rfl: 3   benzonatate (TESSALON) 100 MG capsule, Take 1 capsule (100 mg total) by mouth 2 (two) times daily as needed for cough. (Patient not taking: Reported on 01/11/2022), Disp: 20 capsule, Rfl: 0   budesonide-formoterol (SYMBICORT) 160-4.5 MCG/ACT inhaler, Inhale 1-2 puffs into the lungs 2 (two) times daily. (Patient not taking: Reported on 04/11/2021), Disp: 1 Inhaler, Rfl: 3   EPINEPHrine 0.3 mg/0.3 mL IJ SOAJ injection, Inject 0.3 mg into the muscle daily as needed for anaphylaxis. (Patient not taking: Reported on 03/15/2021), Disp: , Rfl: 0   FLUoxetine (PROZAC) 20 MG capsule, Take 2 capsules (40 mg total) by mouth daily., Disp: 180 capsule, Rfl: 3    promethazine-dextromethorphan (PROMETHAZINE-DM) 6.25-15 MG/5ML syrup, Take 2.5 mLs by mouth 3 (three) times daily as needed for cough. (Patient not taking: Reported on 01/11/2022), Disp: 118 mL, Rfl: 0   simethicone (MYLICON) 80 MG chewable tablet, Chew 1 tablet (80 mg total) by mouth 4 (four) times daily as needed for flatulence. (Patient not taking: Reported on 01/11/2022), Disp: 30 tablet, Rfl: 0  EXAM:  VITALS per patient if applicable: RR between 16-10 bpm  GENERAL: alert, oriented, appears well and in no acute distress  HEENT: atraumatic, conjunctiva clear, no obvious abnormalities on inspection of external nose and ears  NECK: normal movements of the head and neck  LUNGS: on inspection no signs of respiratory distress, breathing rate appears normal, no obvious gross SOB, gasping or wheezing  CV: no obvious cyanosis  MS: moves all visible extremities without noticeable abnormality  PSYCH/NEURO: pleasant and cooperative, no obvious depression or anxiety, speech and thought processing grossly intact  ASSESSMENT AND PLAN:  Discussed the following assessment and plan:  Type 2 diabetes mellitus without complication, without long-term current use of insulin (HCC)  -Per patient recent hemoglobin A1c 7.0%  -Continue metformin 1000 mg twice daily -Continue lifestyle modifications -Discussed in person visit for labs and foot exam. - Plan: metFORMIN (GLUCOPHAGE) 1000 MG tablet  Essential hypertension -Controlled per patient -Continue lifestyle modifications -Continue Maxide-25 mg daily  - Plan: triamterene-hydrochlorothiazide (MAXZIDE-25) 37.5-25 MG tablet  Depression, recurrent (HCC) -Stable -Continue Prozac 40 mg daily.  - Plan: FLUoxetine (PROZAC) 20 MG capsule  Acute knee pain, unspecified laterality -Patient advised to continue follow-up with Workmen's Comp. provider given continued discomfort. -Continue supportive care including heat, rest, compression, elevation.   We  will have patient follow-up in the next 4 to 8 weeks for CPE and labs.  I discussed the assessment and treatment plan with the patient. The patient was provided an opportunity to ask questions and all were answered. The patient agreed with the plan and demonstrated an understanding of the instructions.   The patient was advised to call back or seek an in-person evaluation if the symptoms worsen or if the condition fails to improve as anticipated.  Billie Ruddy, MD

## 2022-02-18 ENCOUNTER — Ambulatory Visit (INDEPENDENT_AMBULATORY_CARE_PROVIDER_SITE_OTHER): Payer: Managed Care, Other (non HMO) | Admitting: Family Medicine

## 2022-02-18 ENCOUNTER — Encounter: Payer: Self-pay | Admitting: Family Medicine

## 2022-02-18 VITALS — BP 128/84 | HR 81 | Temp 97.9°F | Ht 63.5 in | Wt 185.8 lb

## 2022-02-18 DIAGNOSIS — M25541 Pain in joints of right hand: Secondary | ICD-10-CM | POA: Diagnosis not present

## 2022-02-18 DIAGNOSIS — M25542 Pain in joints of left hand: Secondary | ICD-10-CM

## 2022-02-18 DIAGNOSIS — E1169 Type 2 diabetes mellitus with other specified complication: Secondary | ICD-10-CM | POA: Diagnosis not present

## 2022-02-18 DIAGNOSIS — F339 Major depressive disorder, recurrent, unspecified: Secondary | ICD-10-CM

## 2022-02-18 DIAGNOSIS — Z0000 Encounter for general adult medical examination without abnormal findings: Secondary | ICD-10-CM | POA: Diagnosis not present

## 2022-02-18 DIAGNOSIS — I10 Essential (primary) hypertension: Secondary | ICD-10-CM

## 2022-02-18 LAB — COMPREHENSIVE METABOLIC PANEL
ALT: 32 U/L (ref 0–35)
AST: 31 U/L (ref 0–37)
Albumin: 4.5 g/dL (ref 3.5–5.2)
Alkaline Phosphatase: 91 U/L (ref 39–117)
BUN: 20 mg/dL (ref 6–23)
CO2: 29 mEq/L (ref 19–32)
Calcium: 10.1 mg/dL (ref 8.4–10.5)
Chloride: 102 mEq/L (ref 96–112)
Creatinine, Ser: 0.7 mg/dL (ref 0.40–1.20)
GFR: 90.69 mL/min (ref >60.00)
Glucose, Bld: 103 mg/dL — ABNORMAL HIGH (ref 70–99)
Potassium: 4.2 mEq/L (ref 3.5–5.1)
Sodium: 139 mEq/L (ref 135–145)
Total Bilirubin: 0.4 mg/dL (ref 0.2–1.2)
Total Protein: 8.2 g/dL (ref 6.0–8.3)

## 2022-02-18 LAB — CBC WITH DIFFERENTIAL/PLATELET
Basophils Absolute: 0 10*3/uL (ref 0.0–0.1)
Basophils Relative: 1 % (ref 0.0–3.0)
Eosinophils Absolute: 0.1 10*3/uL (ref 0.0–0.7)
Eosinophils Relative: 5.5 % — ABNORMAL HIGH (ref 0.0–5.0)
HCT: 40.6 % (ref 36.0–46.0)
Hemoglobin: 13.3 g/dL (ref 12.0–15.0)
Lymphocytes Relative: 32.2 % (ref 12.0–46.0)
Lymphs Abs: 0.8 10*3/uL (ref 0.7–4.0)
MCHC: 32.9 g/dL (ref 30.0–36.0)
MCV: 90 fl (ref 78.0–100.0)
Monocytes Absolute: 0.3 10*3/uL (ref 0.1–1.0)
Monocytes Relative: 12.6 % — ABNORMAL HIGH (ref 3.0–12.0)
Neutro Abs: 1.3 10*3/uL — ABNORMAL LOW (ref 1.4–7.7)
Neutrophils Relative %: 48.7 % (ref 43.0–77.0)
Platelets: 337 10*3/uL (ref 150.0–400.0)
RBC: 4.51 Mil/uL (ref 3.87–5.11)
RDW: 15 % (ref 11.5–15.5)
WBC: 2.6 10*3/uL — ABNORMAL LOW (ref 4.0–10.5)

## 2022-02-18 LAB — LIPID PANEL
Cholesterol: 198 mg/dL (ref 0–200)
HDL: 71.2 mg/dL (ref >39.00)
LDL Cholesterol: 107 mg/dL — ABNORMAL HIGH (ref 0–99)
NonHDL: 126.73
Total CHOL/HDL Ratio: 3
Triglycerides: 99 mg/dL (ref 0.0–149.0)
VLDL: 19.8 mg/dL (ref 0.0–40.0)

## 2022-02-18 LAB — HEMOGLOBIN A1C: Hgb A1c MFr Bld: 7.7 % — ABNORMAL HIGH (ref 4.6–6.5)

## 2022-02-18 LAB — TSH: TSH: 1.4 u[IU]/mL (ref 0.35–5.50)

## 2022-02-18 LAB — T4, FREE: Free T4: 0.61 ng/dL (ref 0.60–1.60)

## 2022-02-18 NOTE — Progress Notes (Signed)
Subjective:  ?  ? Brittany Taylor is a 66 y.o. female and is here for a comprehensive physical exam. Pt states imaging done of the right knee after fall at work was negative.  Patient seen by Ortho for bilateral triggering of index fingers.  Had steroid injections and surgery several years ago on right and left thumbs.  Typically wakes up in the morning with fingers triggered unless braces them straight.  Patient also endorses bilateral joint stiffness in AM that improves with movement.  Seen by dermatologist for history of psoriasis on Skyrizi.  Patient states blood sugar was elevated few mornings ago as she was unsure if she took her medication that night.  Patient endorses last mammogram 2022. ? ?Social History  ? ?Socioeconomic History  ? Marital status: Single  ?  Spouse name: Not on file  ? Number of children: 0  ? Years of education: Not on file  ? Highest education level: Master's degree (e.g., MA, MS, MEng, MEd, MSW, MBA)  ?Occupational History  ? Occupation: Therapist, sports  ?  Employer: [employer]  ?Tobacco Use  ? Smoking status: Never  ? Smokeless tobacco: Never  ?Vaping Use  ? Vaping Use: Never used  ?Substance and Sexual Activity  ? Alcohol use: Yes  ?  Comment: occasionally  ? Drug use: No  ? Sexual activity: Yes  ?Other Topics Concern  ? Not on file  ?Social History Narrative  ? Not on file  ? ?Social Determinants of Health  ? ?Financial Resource Strain: Not on file  ?Food Insecurity: Not on file  ?Transportation Needs: Not on file  ?Physical Activity: Not on file  ?Stress: Not on file  ?Social Connections: Not on file  ?Intimate Partner Violence: Not on file  ? ?Health Maintenance  ?Topic Date Due  ? Zoster Vaccines- Shingrix (1 of 2) Never done  ? Pneumonia Vaccine 36+ Years old (2 - PCV) 01/27/2019  ? PAP SMEAR-Modifier  12/19/2020  ? HEMOGLOBIN A1C  09/14/2021  ? FOOT EXAM  11/15/2021  ? URINE MICROALBUMIN  03/15/2022  ? OPHTHALMOLOGY EXAM  05/02/2022  ? MAMMOGRAM  06/15/2022  ? INFLUENZA VACCINE   06/18/2022  ? TETANUS/TDAP  04/01/2029  ? COLONOSCOPY (Pts 45-31yr Insurance coverage will need to be confirmed)  04/12/2031  ? DEXA SCAN  Completed  ? COVID-19 Vaccine  Completed  ? Hepatitis C Screening  Completed  ? HIV Screening  Completed  ? HPV VACCINES  Aged Out  ? ? ?The following portions of the patient's history were reviewed and updated as appropriate: allergies, current medications, past family history, past medical history, past social history, past surgical history, and problem list. ? ?Review of Systems ?Pertinent items noted in HPI and remainder of comprehensive ROS otherwise negative.  ? ?Objective:  ? ? BP 128/84 (BP Location: Left Arm, Patient Position: Sitting, Cuff Size: Normal)   Pulse 81   Temp 97.9 ?F (36.6 ?C) (Oral)   Ht 5' 3.5" (1.613 m)   Wt 185 lb 12.8 oz (84.3 kg)   SpO2 100%   BMI 32.40 kg/m?  ?General appearance: alert, cooperative, and no distress ?Head: Normocephalic, without obvious abnormality, atraumatic ?Eyes: conjunctivae/corneas clear. PERRL, EOM's intact. Fundi benign. ?Ears: normal TM's and external ear canals both ears ?Nose: Nares normal. Septum midline. Mucosa normal. No drainage or sinus tenderness. ?Throat: lips, mucosa, and tongue normal; teeth and gums normal ?Neck: no adenopathy, no carotid bruit, no JVD, supple, symmetrical, trachea midline, and thyroid not enlarged, symmetric, no tenderness/mass/nodules ?  Lungs: clear to auscultation bilaterally ?Heart: regular rate and rhythm, S1, S2 normal, no murmur, click, rub or gallop ?Abdomen: soft, non-tender; bowel sounds normal; no masses,  no organomegaly ?Extremities: extremities normal, atraumatic, no cyanosis or edema ?Pulses: 2+ and symmetric ?DP and PT pulses 1+ b/l ?Skin:  warm, dry, intact.  Hyperpigmentation on b/l UEs. ?Lymph nodes: Cervical, supraclavicular, and axillary nodes normal. ?Neurologic: Alert and oriented X 3, normal strength and tone. Normal symmetric reflexes. Normal coordination and gait   ? ?Diabetic Foot Exam - Simple   ?Simple Foot Form ?Diabetic Foot exam was performed with the following findings: Yes 02/18/2022  9:46 AM  ?Visual Inspection ?No deformities, no ulcerations, no other skin breakdown bilaterally: Yes ?Sensation Testing ?See comments: Yes ?Pulse Check ?See comments: Yes ?Comments ?PT and DP pulses slightly decreased b/l, 1+.  Monofilament and vibratory sense mildly decreased bilaterally. ?  ? ? ?  02/18/2022  ?  9:48 AM 08/16/2020  ?  4:27 PM 08/16/2020  ?  4:26 PM  ?Depression screen PHQ 2/9  ?Decreased Interest 0 0 0  ?Down, Depressed, Hopeless 0 1 1  ?PHQ - 2 Score 0 1 1  ?Altered sleeping  1   ?Tired, decreased energy  1   ?Change in appetite  1   ?Feeling bad or failure about yourself   1   ?Trouble concentrating  1   ?Moving slowly or fidgety/restless  1   ?Suicidal thoughts  0   ?PHQ-9 Score  7   ? ? ?  ?Assessment:  ? ? Healthy female exam.    ?  ?Plan:  ? ? Anticipatory guidance given including wearing seatbelts, smoke detectors in the home, increasing physical activity, increasing p.o. intake of water and vegetables. ?-labs ?-colonoscopy done 04/11/21 ?-mammogram done 2022 per pt.  Per chart review last results 06/15/20.  Can have records sent over to update chart. ?-Reviewed immunizations.  Patient to consider Shingrix vaccines.  Had Zostavax 08/10/2015. ?-Given handout ?-Next CPE in 1 year ?See After Visit Summary for Counseling Recommendations  ? ?Essential hypertension  ?-controlled ?-continue lifestyle modifications ?-continue triamterene-HCTZ 37.5-25 mg daily ?- Plan: TSH, T4, Free, CMP ? ?Type 2 diabetes mellitus with other specified complication, without long-term current use of insulin ([place]) ?-hgb A1C 6.4% on 03/15/21.  7% last month during screen at pt's job ?-continue Metformin 1000 mg BID ?-foot exam this visit ?-eye exam done 05/02/21 ?-microalbumin ratio normal on 03/15/21  ? - Plan: TSH, T4, Free, Hemoglobin A1c, Lipid panel, CMP ? ?Depression, recurrent ([place])   ?-stable ?-PHQ 9 score 0 this visit ?-continue prozac 20 mg daily ?-self care ?-continue to monitor ?- Plan: TSH, T4, Free ? ?Arthralgia of both hands  ?-OTC med for treatment of symptoms, massage, topical analgesics prn ?-continue f/u with Rheumatology ?- Plan: CBC with Differential/Platelet ? ?F/u prn  ? ?Grier Mitts, MD ? ?

## 2022-05-03 LAB — HM DIABETES EYE EXAM

## 2022-05-06 ENCOUNTER — Encounter: Payer: Self-pay | Admitting: Family Medicine

## 2022-05-31 LAB — HM MAMMOGRAPHY

## 2022-10-23 ENCOUNTER — Ambulatory Visit (INDEPENDENT_AMBULATORY_CARE_PROVIDER_SITE_OTHER): Payer: Managed Care, Other (non HMO) | Admitting: Family Medicine

## 2022-10-23 ENCOUNTER — Encounter: Payer: Self-pay | Admitting: Family Medicine

## 2022-10-23 VITALS — BP 110/84 | HR 88 | Temp 97.8°F | Wt 191.4 lb

## 2022-10-23 DIAGNOSIS — I10 Essential (primary) hypertension: Secondary | ICD-10-CM

## 2022-10-23 DIAGNOSIS — E1169 Type 2 diabetes mellitus with other specified complication: Secondary | ICD-10-CM | POA: Diagnosis not present

## 2022-10-23 LAB — POCT GLYCOSYLATED HEMOGLOBIN (HGB A1C): Hemoglobin A1C: 8.5 % — AB (ref 4.0–5.6)

## 2022-10-23 MED ORDER — GLIPIZIDE 5 MG PO TABS: 5.0000 mg | ORAL_TABLET | Freq: Two times a day (BID) | ORAL | 3 refills | Status: DC

## 2022-10-23 MED ORDER — BLOOD GLUCOSE MONITOR KIT: PACK | 0 refills | Status: AC

## 2022-10-23 NOTE — Patient Instructions (Addendum)
Your hemoglobin A1c was 8.5% this visit.  A prescription for glipizide 5 mg twice a day was sent to your pharmacy.  When starting the medication you can take take it at night for 1 week then after that week start the twice a day dosing.  Continue metformin.  We will have you follow-up in clinic in the next 4-6 weeks to see how things are going.  Sooner if needed.

## 2022-10-23 NOTE — Progress Notes (Signed)
Subjective:    Patient ID: Brittany Taylor, female    DOB: 03/26/1956, 66 y.o.   MRN: [id number]  Chief Complaint  Patient presents with   Diabetes   Follow-up    HPI Patient was seen today for follow-up on diabetes.  Patient taking metformin 1000 mg twice daily.  Endorses elevated a.m. blood sugar readings typically around 160s for the last few weeks.  Hemoglobin A1c was 7.7% 02/18/2022.  Patient endorses eating some snacks at work such as a small bag of chips one half crackers when needed.  Endorses eating more sweets around Thanksgiving.  Plans to restart exercising.  Patient unsure if OTC ReliOn glucometer is working correctly as it did not come with control fluid.  Past Medical History:  Diagnosis Date   Allergy-induced asthma    daily inhaler   Anxiety    Arthritis    hands   Dental crown present    Depression    GERD (gastroesophageal reflux disease)    High cholesterol    Hypertension    states under control with med., has been on med. x 10 yr.   Non-insulin dependent type 2 diabetes mellitus (HCC)    PONV (postoperative nausea and vomiting)    patient thinks related to Morphine   Psoriasis    arms, bilateral leg, back, breast, neck, ears   Stenosing tenosynovitis of finger of right hand 03/2015   right ring finger    Allergies  Allergen Reactions   Ace Inhibitors Other (See Comments)    ANGIOEDEMA    Morphine Itching and Nausea And Vomiting   Ramipril Other (See Comments)    ANGIOEDEMA   Mold Extract [Trichophyton] Itching and Cough    Throat itches   Latex Rash    Used latex gloves with powder, rash occured   Mercurochrome [Merbromin] Rash    ROS General: Denies fever, chills, night sweats, changes in weight, changes in appetite HEENT: Denies headaches, ear pain, changes in vision, rhinorrhea, sore throat CV: Denies CP, palpitations, SOB, orthopnea Pulm: Denies SOB, cough, wheezing GI: Denies abdominal pain, nausea, vomiting, diarrhea, constipation GU:  Denies dysuria, hematuria, frequency, vaginal discharge Msk: Denies muscle cramps, joint pains Neuro: Denies weakness, numbness, tingling Skin: Denies rashes, bruising Psych: Denies depression, anxiety, hallucinations     Objective:    Blood pressure 110/84, pulse 88, temperature 97.8 F (36.6 C), temperature source Oral, weight 191 lb 6.4 oz (86.8 kg), SpO2 98 %.  Gen. Pleasant, well-nourished, in no distress, normal affect   HEENT: [place]/AT, face symmetric, conjunctiva clear, no scleral icterus, PERRLA, EOMI, nares patent without drainage Lungs: no accessory muscle use, no wheezes or rales Cardiovascular: RRR, no peripheral edema Neuro:  A&Ox3, CN II-XII intact, normal gait Skin:  Warm, no lesions/ rash  Wt Readings from Last 3 Encounters:  10/23/22 191 lb 6.4 oz (86.8 kg)  02/18/22 185 lb 12.8 oz (84.3 kg)  04/11/21 183 lb (83 kg)    Lab Results  Component Value Date   WBC 2.6 (L) 02/18/2022   HGB 13.3 02/18/2022   HCT 40.6 02/18/2022   PLT 337.0 02/18/2022   GLUCOSE 103 (H) 02/18/2022   CHOL 198 02/18/2022   TRIG 99.0 02/18/2022   HDL 71.20 02/18/2022   LDLCALC 107 (H) 02/18/2022   ALT 32 02/18/2022   AST 31 02/18/2022   NA 139 02/18/2022   K 4.2 02/18/2022   CL 102 02/18/2022   CREATININE 0.70 02/18/2022   BUN 20 02/18/2022   CO2 29 02/18/2022  TSH 1.40 02/18/2022   HGBA1C 7.7 (H) 02/18/2022   MICROALBUR <0.7 03/15/2021    Assessment/Plan:  Type 2 diabetes mellitus with other specified complication, without long-term current use of insulin (HCC) -Mildly uncontrolled -Hemoglobin A1c 7.7% on/3/23 -Hemoglobin A1c this visit 8.5% -Continue lifestyle modifications -Will start glipizide 5 mg twice daily.  Pt can take 5 mg in the evening x 1 week then increase to twice daily dosing. -Continue to monitor blood sugar and keep a log to bring with you to clinic -Patient also encouraged to check with insurance companies regarding medications that are covered for  DM. -Rx written for glucometer - Plan: POC HgB A1c, blood glucose meter kit and supplies KIT, glipiZIDE (GLUCOTROL) 5 MG tablet  Essential hypertension -Controlled -Continue current medications including triamterene-hydrochlorothiazide 37.5-25 mg daily -Lifestyle modifications  F/u in 4-6 wks  Grier Mitts, MD

## 2022-12-02 ENCOUNTER — Ambulatory Visit: Payer: Managed Care, Other (non HMO) | Admitting: Family Medicine

## 2022-12-02 ENCOUNTER — Encounter: Payer: Self-pay | Admitting: Family Medicine

## 2022-12-02 VITALS — BP 120/78 | HR 91 | Temp 98.6°F | Wt 191.8 lb

## 2022-12-02 DIAGNOSIS — E1169 Type 2 diabetes mellitus with other specified complication: Secondary | ICD-10-CM | POA: Diagnosis not present

## 2022-12-02 DIAGNOSIS — I10 Essential (primary) hypertension: Secondary | ICD-10-CM | POA: Diagnosis not present

## 2022-12-02 MED ORDER — FREESTYLE LIBRE 2 SENSOR MISC: 1.0000 | 11 refills | Status: DC

## 2022-12-02 NOTE — Progress Notes (Signed)
Established Patient Office Visit  Subjective   Patient ID: Brittany Taylor, female    DOB: [birth year]  Age: 67 y.o. MRN: [id number]  Chief Complaint  Patient presents with   Follow-up    On DM. Pt reports she is using Rx Relion to check her blood glucose. Added that her insurance covered freestyle libre and one touch.     Patient seen for follow-up on DM and HTN.  States blood sugar 91-150 in AM.  Typically 120s-130s.  Noticed the 150 reading after having spaghetti for dinner the night before.  Patient taking metformin 1000 mg twice daily and glipizide 5 mg twice daily.  This increase physical activity.  Using ReliOn meter.  States insurance company covers freestyle libre and One Probation officer meters.  Hemoglobin A1c was 8.5% on 10/23/22.  BP controlled on current meds.  Denies headaches.      Review of Systems  Constitutional:  Negative for malaise/fatigue and weight loss.  Eyes:  Negative for blurred vision.  Cardiovascular:  Negative for chest pain, palpitations and leg swelling.  Gastrointestinal:  Negative for abdominal pain, diarrhea and vomiting.  Neurological:  Negative for dizziness and headaches.  Endo/Heme/Allergies:  Negative for polydipsia.      Objective:     BP 120/78 (BP Location: Right Arm, Patient Position: Sitting, Cuff Size: Large)   Pulse 91   Temp 98.6 F (37 C) (Oral)   Wt 191 lb 12.8 oz (87 kg)   BMI 33.44 kg/m    Physical Exam Constitutional:      Appearance: Normal appearance.  HENT:     Head: Normocephalic and atraumatic.     Nose: Nose normal.     Mouth/Throat:     Mouth: Mucous membranes are moist.  Eyes:     Extraocular Movements: Extraocular movements intact.     Conjunctiva/sclera: Conjunctivae normal.     Pupils: Pupils are equal, round, and reactive to light.  Cardiovascular:     Rate and Rhythm: Normal rate and regular rhythm.     Pulses: Normal pulses.     Heart sounds: Normal heart sounds.  Pulmonary:     Effort: Pulmonary  effort is normal.     Breath sounds: Normal breath sounds.  Skin:    General: Skin is warm and dry.  Neurological:     General: No focal deficit present.     Mental Status: She is alert and oriented to person, place, and time.     No results found for any visits on 12/02/22.    The 10-year ASCVD risk score (Arnett DK, et al., 2019) is: 18%    Assessment & Plan:   Problem List Items Addressed This Visit       Endocrine   Diabetes ([place]) - Primary   Relevant Medications   Continuous Blood Gluc Sensor (FREESTYLE LIBRE 2 SENSOR) MISC   Other Visit Diagnoses     Essential hypertension         Diabetes improving.  Hemoglobin A1c 8.5% on 10/23/2022.  Continue current medications including metformin 1000 mg twice daily and glipizide 5 mg twice daily.  Lifestyle modifications.  Given Rx for freestyle libre 2 sensor.  Has prescription for regular glucometer at pharmacy in the event freestyle Elenor Legato is not covered.  BP controlled.  Continue current medication triamterene-hydrochlorothiazide 37.5-25 mg daily.  Patient encouraged to schedule mammogram diabetic retinopathy screening, and obtain shingles vaccine.  Consider additional pneumonia vaccine as had several doses of  PPSV23.  Return in about  3 months (around 03/03/2023).    Billie Ruddy, MD

## 2022-12-02 NOTE — Patient Instructions (Addendum)
Do not forget to schedule your mammogram.  You can contact the breast center at [place] at [phone].  Getting a shingles vaccine.  Shingles is an infection from the  virus that causes chickenpox.  Hemoglobin A1c was 8.5% on 10/23/2022.  Ideally we would like this to be 7% or less.  We can recheck your A1c in March.

## 2023-01-23 ENCOUNTER — Ambulatory Visit (INDEPENDENT_AMBULATORY_CARE_PROVIDER_SITE_OTHER): Payer: No Typology Code available for payment source | Admitting: Family Medicine

## 2023-01-23 ENCOUNTER — Encounter: Payer: Self-pay | Admitting: Family Medicine

## 2023-01-23 VITALS — BP 114/70 | HR 91 | Temp 97.5°F | Ht 63.5 in | Wt 190.4 lb

## 2023-01-23 DIAGNOSIS — E1169 Type 2 diabetes mellitus with other specified complication: Secondary | ICD-10-CM

## 2023-01-23 LAB — POCT GLYCOSYLATED HEMOGLOBIN (HGB A1C): Hemoglobin A1C: 6.5 % — AB (ref 4.0–5.6)

## 2023-01-23 MED ORDER — GLIPIZIDE 5 MG PO TABS: ORAL_TABLET | ORAL | 3 refills | Status: DC

## 2023-01-23 NOTE — Patient Instructions (Addendum)
Your A1C was 6.5% this visit!!!!!  It was 8.5% on 10/23/2022.  Continue taking the glipizide 5 mg in the am and 2.5 mg in the evening.  Look for glucose tabs in the drug store or at your pharmacy.  They help increase your blood sugar by a specified amount if needed during an episode of low blood sugar (hypoglycemia).

## 2023-01-23 NOTE — Progress Notes (Signed)
   Established Patient Office Visit   Subjective  Patient ID: Brittany Taylor, female    DOB: 07/25/1956  Age: 67 y.o. MRN: [id number]  Chief Complaint  Patient presents with   Medication Consultation    Pt is a 67 yo female with pmh sig for DM II, HTN, asthma seen for f/u.  Pt endorses an episode of hypoglycemia when she took evening dose of glipizide 5 mg, but did not eat right away.  Pt states she passed out in the kitchen while she was standing at the stove.  BS was in the 60s.  Pt started taking glipizide 2.5 mg in the evening as she was afraid of another hypoglycemic episode. States at times her cgm goes off around 2 am as bs is in the 60s.      ROS Negative unless stated above    Objective:     BP 114/70 (BP Location: Left Arm, Patient Position: Sitting, Cuff Size: Normal)   Pulse 91   Temp (!) 97.5 F (36.4 C) (Oral)   Ht 5' 3.5" (1.613 m)   Wt 190 lb 6.4 oz (86.4 kg)   SpO2 98%   BMI 33.20 kg/m    Physical Exam Constitutional:      General: She is not in acute distress.    Appearance: Normal appearance.  HENT:     Head: Normocephalic and atraumatic.     Nose: Nose normal.     Mouth/Throat:     Mouth: Mucous membranes are moist.  Eyes:     Extraocular Movements: Extraocular movements intact.     Conjunctiva/sclera: Conjunctivae normal.     Pupils: Pupils are equal, round, and reactive to light.  Cardiovascular:     Rate and Rhythm: Normal rate and regular rhythm.     Heart sounds: Normal heart sounds. No murmur heard.    No gallop.  Pulmonary:     Effort: Pulmonary effort is normal. No respiratory distress.     Breath sounds: Normal breath sounds. No wheezing, rhonchi or rales.  Skin:    General: Skin is warm and dry.  Neurological:     Mental Status: She is alert and oriented to person, place, and time.      Results for orders placed or performed in visit on 01/23/23  POC HgB A1c  Result Value Ref Range   Hemoglobin A1C 6.5 (A) 4.0 - 5.6 %    HbA1c POC (<> result, manual entry)     HbA1c, POC (prediabetic range)     HbA1c, POC (controlled diabetic range)        Assessment & Plan:  Type 2 diabetes mellitus with other specified complication, without long-term current use of insulin (HCC) -Hemoglobin A1c 8.5% on 10/23/2022 -Hemoglobin A1c this visit 6.5% -Continue metformin 1000 mg twice daily -Okay to take glipizide 5 mg in a.m. and 2.5 mg in p.m. -Discussed glucose tabs and other ways to fix hypoglycemia. -Foot exam due next month -     Microalbumin / creatinine urine ratio -     POCT glycosylated hemoglobin (Hb A1C) -     glipiZIDE; Take one tab (5 mg) with breakfast and half a tab (2.5 mg) with dinner daily.  Dispense: 135 tablet; Refill: 3    Return in about 3 months (around 04/25/2023) for diabetes.   CPE in 1 month.  Billie Ruddy, MD

## 2023-01-24 LAB — MICROALBUMIN / CREATININE URINE RATIO
Creatinine,U: 75.7 mg/dL
Microalb Creat Ratio: 0.9 mg/g (ref 0.0–30.0)
Microalb, Ur: <0.7 mg/dL (ref 0.0–1.9)

## 2023-02-02 ENCOUNTER — Other Ambulatory Visit: Payer: Self-pay | Admitting: Family Medicine

## 2023-02-02 DIAGNOSIS — I10 Essential (primary) hypertension: Secondary | ICD-10-CM

## 2023-02-02 DIAGNOSIS — F339 Major depressive disorder, recurrent, unspecified: Secondary | ICD-10-CM

## 2023-02-10 ENCOUNTER — Other Ambulatory Visit: Payer: Self-pay | Admitting: Family Medicine

## 2023-02-10 DIAGNOSIS — E119 Type 2 diabetes mellitus without complications: Secondary | ICD-10-CM

## 2023-04-30 ENCOUNTER — Telehealth: Payer: Self-pay | Admitting: Family Medicine

## 2023-04-30 NOTE — Telephone Encounter (Signed)
Ok

## 2023-04-30 NOTE — Telephone Encounter (Signed)
Pt called to say she would like to change the FreeStyle Libre 2 Sensor //Continuous Blood Gluc Sensor (FREESTYLE LIBRE 2 SENSOR) MISC   to the FREESTYLE LIBRE 3 SENSOR  Please advise.  [id number] - [place], [place] - [address] Phone: [phone]  Fax: [phone]

## 2023-05-01 MED ORDER — FREESTYLE LIBRE 3 SENSOR MISC: 6 refills | Status: DC

## 2023-05-01 NOTE — Telephone Encounter (Signed)
Rx for the [facility] [place] 3 sensor sent to requested pharmacy.

## 2023-05-16 ENCOUNTER — Other Ambulatory Visit: Payer: Self-pay | Admitting: Family Medicine

## 2023-05-16 DIAGNOSIS — E119 Type 2 diabetes mellitus without complications: Secondary | ICD-10-CM

## 2023-05-16 DIAGNOSIS — I10 Essential (primary) hypertension: Secondary | ICD-10-CM

## 2023-05-16 DIAGNOSIS — F339 Major depressive disorder, recurrent, unspecified: Secondary | ICD-10-CM

## 2023-07-07 LAB — HM MAMMOGRAPHY

## 2023-07-14 ENCOUNTER — Other Ambulatory Visit: Payer: Self-pay | Admitting: Obstetrics and Gynecology

## 2023-07-14 DIAGNOSIS — Z13820 Encounter for screening for osteoporosis: Secondary | ICD-10-CM

## 2023-07-14 LAB — HM PAP SMEAR
HPV 16/18/45 genotyping: NEGATIVE
HPV, high-risk: POSITIVE

## 2023-08-04 LAB — LAB REPORT - SCANNED
A1c: 6.8
EGFR: 91

## 2023-09-22 ENCOUNTER — Other Ambulatory Visit: Payer: Self-pay

## 2023-09-22 ENCOUNTER — Ambulatory Visit: Payer: No Typology Code available for payment source | Admitting: Family Medicine

## 2023-09-22 DIAGNOSIS — E1169 Type 2 diabetes mellitus with other specified complication: Secondary | ICD-10-CM

## 2023-09-22 LAB — HM DEXA SCAN: HM Dexa Scan: NORMAL

## 2023-09-22 MED ORDER — GLIPIZIDE 5 MG PO TABS: ORAL_TABLET | ORAL | 0 refills | Status: DC

## 2023-09-29 ENCOUNTER — Ambulatory Visit (INDEPENDENT_AMBULATORY_CARE_PROVIDER_SITE_OTHER): Payer: Managed Care, Other (non HMO) | Admitting: Family Medicine

## 2023-09-29 VITALS — BP 136/80 | HR 83 | Temp 98.8°F | Ht 63.5 in | Wt 188.2 lb

## 2023-09-29 DIAGNOSIS — I10 Essential (primary) hypertension: Secondary | ICD-10-CM

## 2023-09-29 DIAGNOSIS — E1169 Type 2 diabetes mellitus with other specified complication: Secondary | ICD-10-CM

## 2023-09-29 DIAGNOSIS — Z23 Encounter for immunization: Secondary | ICD-10-CM | POA: Diagnosis not present

## 2023-09-29 DIAGNOSIS — Z7984 Long term (current) use of oral hypoglycemic drugs: Secondary | ICD-10-CM | POA: Diagnosis not present

## 2023-09-29 MED ORDER — GLIPIZIDE 5 MG PO TABS: ORAL_TABLET | ORAL | 0 refills | Status: DC

## 2023-09-29 NOTE — Progress Notes (Signed)
Established Patient Office Visit   Subjective  Patient ID: Brittany Taylor, female    DOB: Jan 07, 1956  Age: 67 y.o. MRN: [id number]  Chief Complaint  Patient presents with   Medical Management of Chronic Issues    Patient is a 67 year old female seen for follow-up on chronic conditions.  Patient states blood sugar is good but still having spikes in low readings.  Patient typically eating a sausage croissant, raisin bran, or 1-2 pieces of toast in a.m.  If rushing will wait to take glipizide 5 mg in a.m until at work.  Per freestyle libre app patient having low blood sugar readings around 10 AM prior to eating lunch with spikes in BS around noon.  Patient states sometimes she will eat half a peanut butter sandwich prior to lunch.  Patient states when her blood sugar is low she does not feel it but the sensor alerts her.  Patient states BP has been good.  Recent labs with OB/GYN on 08/01/2023 reviewed on patient's phone.  Sodium 140, potassium 4.5, chloride 102, creatinine 0.73, BUN 21, blood sugar 109, calcium 9.4, AST 21, ALT 30, total cholesterol 205, triglycerides 62, HDL 74, LDL 120, A1c 6.8%.  Vitamin D, B12, and folate all normal.  Bone density scan also done with OB/GYN.  Patient mentions being told her heart was enlarged in the past.  Worried about that and elevated cholesterol.    Patient Active Problem List   Diagnosis Date Noted   Fibroid, uterine 03/23/2018   Trigger finger, acquired 02/07/2015   Routine general medical examination at a health care facility 08/09/2014   Anemia, unspecified 07/24/2013   Nonspecific abnormal electrocardiogram (ECG) (EKG) 07/23/2013   Psoriasis 07/14/2012   Menopausal state 07/14/2012   Encounter for long-term (current) use of other medications 06/17/2011   GOITER, MULTINODULAR 03/06/2009   LEUKOPENIA, CHRONIC 03/06/2009   Asymptomatic menopausal state 03/06/2009   TRIGGER FINGER, RIGHT THUMB 02/25/2008   HSV 08/22/2007   Diabetes (HCC)  08/22/2007   HYPERLIPIDEMIA 08/22/2007   DEPRESSION 08/22/2007   ALLERGIC RHINITIS 08/22/2007   ANGIOEDEMA 08/22/2007   Past Medical History:  Diagnosis Date   Allergy-induced asthma    daily inhaler   Anxiety    Arthritis    hands   Dental crown present    Depression    GERD (gastroesophageal reflux disease)    High cholesterol    Hypertension    states under control with med., has been on med. x 10 yr.   Non-insulin dependent type 2 diabetes mellitus (HCC)    PONV (postoperative nausea and vomiting)    patient thinks related to Morphine   Psoriasis    arms, bilateral leg, back, breast, neck, ears   Stenosing tenosynovitis of finger of right hand 03/2015   right ring finger   Past Surgical History:  Procedure Laterality Date   BREAST REDUCTION SURGERY  2000   CYSTOSCOPY N/A 03/23/2018   Procedure: CYSTOSCOPY;  Surgeon: Silverio Lay, MD;  Location: [facility];  Service: Gynecology;  Laterality: N/A;   LEEP  03/18/2003   MYOMECTOMY  1995   POLYPECTOMY  01/2015   uterine   REDUCTION MAMMAPLASTY     2000   SUPRACERVICAL ABDOMINAL HYSTERECTOMY Bilateral 03/23/2018   Procedure: HYSTERECTOMY SUPRACERVICAL ABDOMINAL;  Surgeon: Silverio Lay, MD;  Location: [facility];  Service: Gynecology;  Laterality: Bilateral;   TRIGGER FINGER RELEASE Left 06/15/2009   thumb   TRIGGER FINGER RELEASE Right 11/15/2008   thumb   TRIGGER FINGER RELEASE  Right 04/13/2015   Procedure: RELEASE A-1 PULLEY RIGHT RING FINGER;  Surgeon: Cindee Salt, MD;  Location: [facility];  Service: Orthopedics;  Laterality: Right;   WISDOM TOOTH EXTRACTION  1972   Social History   Tobacco Use   Smoking status: Never   Smokeless tobacco: Never  Vaping Use   Vaping status: Never Used  Substance Use Topics   Alcohol use: Yes    Comment: occasionally   Drug use: No   Family History  Problem Relation Age of Onset   Diabetes Mother    Diabetes Father    Stroke Father    Hypertension Father    Glaucoma  Father    Colon cancer Neg Hx    Esophageal cancer Neg Hx    Rectal cancer Neg Hx    Stomach cancer Neg Hx    Allergies  Allergen Reactions   Ace Inhibitors Other (See Comments)    ANGIOEDEMA    Morphine Itching and Nausea And Vomiting   Other Swelling   Ramipril Other (See Comments)    ANGIOEDEMA   Methimazole Other (See Comments)    Unknown reaction   Trichophyton Cough, Itching and Other (See Comments)    Throat itches   Latex Rash    Used latex gloves with powder, rash occured   Mercurochrome [Merbromin] Rash      ROS Negative unless stated above    Objective:     BP 136/80 (BP Location: Left Arm, Patient Position: Sitting, Cuff Size: Normal)   Pulse 83   Temp 98.8 F (37.1 C) (Oral)   Ht 5' 3.5" (1.613 m)   Wt 188 lb 3.2 oz (85.4 kg)   SpO2 97%   BMI 32.82 kg/m    Physical Exam Constitutional:      General: She is not in acute distress.    Appearance: Normal appearance.  HENT:     Head: Normocephalic and atraumatic.     Nose: Nose normal.     Mouth/Throat:     Mouth: Mucous membranes are moist.  Cardiovascular:     Rate and Rhythm: Normal rate and regular rhythm.     Heart sounds: Normal heart sounds. No murmur heard.    No gallop.  Pulmonary:     Effort: Pulmonary effort is normal. No respiratory distress.     Breath sounds: Normal breath sounds. No wheezing, rhonchi or rales.  Skin:    General: Skin is warm and dry.  Neurological:     Mental Status: She is alert and oriented to person, place, and time.    Diabetic Foot Exam - Simple   Simple Foot Form Diabetic Foot exam was performed with the following findings: Yes 09/29/2023  9:22 AM  Visual Inspection No deformities, no ulcerations, no other skin breakdown bilaterally: Yes See comments: Yes Sensation Testing Intact to touch and monofilament testing bilaterally: Yes See comments: Yes Pulse Check Posterior Tibialis and Dorsalis pulse intact bilaterally: Yes See comments:  Yes Comments Hyper and hypopigmentation on bilateral LEs 2/2 history of psoriasis.  DP and PT pulses in right foot 2+.  Left foot 3+.  Decreased vibratory sensation right foot.  Normal vibratory sensation and monofilament testing in left foot.     No results found for any visits on 09/29/23.    Assessment & Plan:  Type 2 diabetes mellitus with other specified complication, without long-term current use of insulin (HCC) -Controlled -Hemoglobin A1c 6.8% on 08/04/2023 -Discussed the need for decreased hypoglycemic episodes -Patient encouraged to keep  a food diary.  Referral placed to nutrition. -Will continue metformin 1000 mg twice daily. -Decrease glipizide from 5 to 2.5 mg in a.m. and continue 2.5 mg with dinner.  Discussed the importance of taking medication with food. -For continued symptoms discussed switching medication to [place] or Jardiance for cardiac protection. -Foot exam done this visit -Patient to schedule eye exam as last done 05/03/2022. -     Amb Referral to Nutrition and Diabetic Education -     glipiZIDE; Take half tab (2.5 mg) with breakfast and half a tab (2.5 mg) with dinner daily.  Dispense: 45 tablet; Refill: 0  Essential hypertension -Controlled -Discussed the importance of lifestyle modifications -Continue triamterene-hydrochlorothiazide 37.5-25 mg daily -Patient with allergy to ACE I--angioedema -Discussed importance of lifestyle modifications -Patient encouraged to monitor BP at home and keep a log to bring with her to clinic  Need for Streptococcus pneumoniae vaccination -     Pneumococcal conjugate vaccine 20-valent  Need for shingles vaccine -     Varicella-zoster vaccine IM   Return in about 5 weeks (around 11/03/2023).   Deeann Saint, MD

## 2023-09-29 NOTE — Addendum Note (Signed)
Addended by: Abbe Amsterdam R on: 09/29/2023 10:02 AM   Modules accepted: Level of Service

## 2023-09-29 NOTE — Patient Instructions (Signed)
Take a half tab of glipizide (2.5 mg) in the morning and in the evening.  Continue metformin 1000 mg twice a day.  Monitor your blood sugars to see if they continue to drop during the morning.  We will have you follow-up in 4-6 weeks to see how your blood sugars are doing.  Sooner if needed.  If you notice drastic increases or drops in your blood sugar before this time you can contact the clinic by phone.  A referral was placed for you to see the nutritionist.  They will contact you about setting up this appointment.

## 2023-10-28 LAB — HM DIABETES EYE EXAM

## 2023-11-07 ENCOUNTER — Encounter: Payer: Self-pay | Admitting: Family Medicine

## 2023-11-07 ENCOUNTER — Ambulatory Visit (INDEPENDENT_AMBULATORY_CARE_PROVIDER_SITE_OTHER): Payer: Medicare Other | Admitting: Family Medicine

## 2023-11-07 VITALS — BP 138/72 | HR 79 | Temp 98.8°F | Ht 63.5 in | Wt 189.8 lb

## 2023-11-07 DIAGNOSIS — E1169 Type 2 diabetes mellitus with other specified complication: Secondary | ICD-10-CM

## 2023-11-07 DIAGNOSIS — I10 Essential (primary) hypertension: Secondary | ICD-10-CM | POA: Diagnosis not present

## 2023-11-07 DIAGNOSIS — Z7984 Long term (current) use of oral hypoglycemic drugs: Secondary | ICD-10-CM

## 2023-11-07 MED ORDER — DAPAGLIFLOZIN PROPANEDIOL 5 MG PO TABS: 5.0000 mg | ORAL_TABLET | Freq: Every day | ORAL | 3 refills | Status: DC

## 2023-11-07 NOTE — Patient Instructions (Signed)
A prescription for Farxiga 5 mg daily was sent to your pharmacy.  You can take this instead of the glipizide for blood pressure.

## 2023-11-07 NOTE — Progress Notes (Signed)
Established Patient Office Visit   Subjective  Patient ID: Brittany Taylor, female    DOB: 1956/08/16  Age: 67 y.o. MRN: [id number]  Chief Complaint  Patient presents with   Diabetes    Patient is a 67 year old female seen for follow-up on DM 2.  Blood sugar this morning 130.  Taking glipizide 2.5 mg twice daily and metformin 1000 mg twice daily.  Having several episodes of hypoglycemia at night and before lunch.  Pt alerted to lows by cgm.  Does recall feeling sleepy during one episode.  Pt's insurance just changed to medicare.  Still needs to choose a Part D plan, so currently without coverage.    Patient Active Problem List   Diagnosis Date Noted   Fibroid, uterine 03/23/2018   Trigger finger, acquired 02/07/2015   Routine general medical examination at a health care facility 08/09/2014   Anemia, unspecified 07/24/2013   Nonspecific abnormal electrocardiogram (ECG) (EKG) 07/23/2013   Psoriasis 07/14/2012   Menopausal state 07/14/2012   Encounter for long-term (current) use of other medications 06/17/2011   GOITER, MULTINODULAR 03/06/2009   LEUKOPENIA, CHRONIC 03/06/2009   Asymptomatic menopausal state 03/06/2009   TRIGGER FINGER, RIGHT THUMB 02/25/2008   HSV 08/22/2007   Diabetes (HCC) 08/22/2007   HYPERLIPIDEMIA 08/22/2007   DEPRESSION 08/22/2007   ALLERGIC RHINITIS 08/22/2007   ANGIOEDEMA 08/22/2007   Past Medical History:  Diagnosis Date   Allergy-induced asthma    daily inhaler   Anxiety    Arthritis    hands   Dental crown present    Depression    GERD (gastroesophageal reflux disease)    High cholesterol    Hypertension    states under control with med., has been on med. x 10 yr.   Non-insulin dependent type 2 diabetes mellitus (HCC)    PONV (postoperative nausea and vomiting)    patient thinks related to Morphine   Psoriasis    arms, bilateral leg, back, breast, neck, ears   Stenosing tenosynovitis of finger of right hand 03/2015   right ring finger    Past Surgical History:  Procedure Laterality Date   BREAST REDUCTION SURGERY  2000   CYSTOSCOPY N/A 03/23/2018   Procedure: CYSTOSCOPY;  Surgeon: Silverio Lay, MD;  Location: [facility];  Service: Gynecology;  Laterality: N/A;   LEEP  03/18/2003   MYOMECTOMY  1995   POLYPECTOMY  01/2015   uterine   REDUCTION MAMMAPLASTY     2000   SUPRACERVICAL ABDOMINAL HYSTERECTOMY Bilateral 03/23/2018   Procedure: HYSTERECTOMY SUPRACERVICAL ABDOMINAL;  Surgeon: Silverio Lay, MD;  Location: [facility];  Service: Gynecology;  Laterality: Bilateral;   TRIGGER FINGER RELEASE Left 06/15/2009   thumb   TRIGGER FINGER RELEASE Right 11/15/2008   thumb   TRIGGER FINGER RELEASE Right 04/13/2015   Procedure: RELEASE A-1 PULLEY RIGHT RING FINGER;  Surgeon: Cindee Salt, MD;  Location: [facility];  Service: Orthopedics;  Laterality: Right;   WISDOM TOOTH EXTRACTION  1972   Social History   Tobacco Use   Smoking status: Never   Smokeless tobacco: Never  Vaping Use   Vaping status: Never Used  Substance Use Topics   Alcohol use: Yes    Comment: occasionally   Drug use: No   Family History  Problem Relation Age of Onset   Diabetes Mother    Diabetes Father    Stroke Father    Hypertension Father    Glaucoma Father    Colon cancer Neg Hx    Esophageal  cancer Neg Hx    Rectal cancer Neg Hx    Stomach cancer Neg Hx    Allergies  Allergen Reactions   Ace Inhibitors Other (See Comments)    ANGIOEDEMA    Morphine Itching and Nausea And Vomiting   Other Swelling   Ramipril Other (See Comments)    ANGIOEDEMA   Methimazole Other (See Comments)    Unknown reaction   Trichophyton Cough, Itching and Other (See Comments)    Throat itches   Latex Rash    Used latex gloves with powder, rash occured   Mercurochrome [Merbromin] Rash      ROS Negative unless stated above    Objective:     BP 138/72 (BP Location: Right Arm, Patient Position: Sitting, Cuff Size: Large)   Pulse 79   Temp  98.8 F (37.1 C) (Oral)   Ht 5' 3.5" (1.613 m)   Wt 189 lb 12.8 oz (86.1 kg)   SpO2 95%   BMI 33.09 kg/m  BP Readings from Last 3 Encounters:  11/07/23 138/72  09/29/23 136/80  01/23/23 114/70   Wt Readings from Last 3 Encounters:  11/07/23 189 lb 12.8 oz (86.1 kg)  09/29/23 188 lb 3.2 oz (85.4 kg)  01/23/23 190 lb 6.4 oz (86.4 kg)      Physical Exam Constitutional:      General: She is not in acute distress.    Appearance: Normal appearance.  HENT:     Head: Normocephalic and atraumatic.     Nose: Nose normal.     Mouth/Throat:     Mouth: Mucous membranes are moist.  Cardiovascular:     Rate and Rhythm: Normal rate and regular rhythm.     Heart sounds: Normal heart sounds. No murmur heard.    No gallop.  Pulmonary:     Effort: Pulmonary effort is normal. No respiratory distress.     Breath sounds: Normal breath sounds. No wheezing, rhonchi or rales.  Skin:    General: Skin is warm and dry.  Neurological:     Mental Status: She is alert and oriented to person, place, and time.      No results found for any visits on 11/07/23.    Assessment & Plan:  Type 2 diabetes mellitus with other specified complication, without long-term current use of insulin (HCC) -     Dapagliflozin Propanediol; Take 1 tablet (5 mg total) by mouth daily before breakfast.  Dispense: 90 tablet; Refill: 3  Essential hypertension  Will d/c glipizide 2.5 mg BID given hypoglycemic episodes.  Will start Farxiga 5 mg daily.  Given samples in clinic until insurance starts in Jan.  Continue metformin 1000 mg twice daily.  Adjust dose if needed.  Pt to monitor bs closely.    BP controlled.  Continue triamterene-hydrochlorothiazide 37.5-25 mg daily.  May need to adjust dose since starting Farxiga as it may lower BP.  Discussed importance of hydration and lifestyle modifications.  Patient advised to have second dose of shingles vaccine at local pharmacy given change in insurance.  Return in about 8  weeks (around 01/02/2024).   Deeann Saint, MD

## 2023-11-27 DIAGNOSIS — J3081 Allergic rhinitis due to animal (cat) (dog) hair and dander: Secondary | ICD-10-CM | POA: Diagnosis not present

## 2023-11-27 DIAGNOSIS — J3089 Other allergic rhinitis: Secondary | ICD-10-CM | POA: Diagnosis not present

## 2023-11-27 DIAGNOSIS — J301 Allergic rhinitis due to pollen: Secondary | ICD-10-CM | POA: Diagnosis not present

## 2023-12-03 ENCOUNTER — Encounter: Payer: No Typology Code available for payment source | Attending: Family Medicine | Admitting: Skilled Nursing Facility1

## 2023-12-03 ENCOUNTER — Encounter: Payer: Self-pay | Admitting: Skilled Nursing Facility1

## 2023-12-03 DIAGNOSIS — E119 Type 2 diabetes mellitus without complications: Secondary | ICD-10-CM | POA: Diagnosis not present

## 2023-12-03 DIAGNOSIS — J3081 Allergic rhinitis due to animal (cat) (dog) hair and dander: Secondary | ICD-10-CM | POA: Diagnosis not present

## 2023-12-03 DIAGNOSIS — J301 Allergic rhinitis due to pollen: Secondary | ICD-10-CM | POA: Diagnosis not present

## 2023-12-03 DIAGNOSIS — J3089 Other allergic rhinitis: Secondary | ICD-10-CM | POA: Diagnosis not present

## 2023-12-03 NOTE — Progress Notes (Signed)
Diabetes Self-Management Education  Visit Type: First/Initial  Appt. Start Time: 4:25 Appt. End Time: 5:30  12/04/2023  Ms. Brittany Taylor, identified by name and date of birth, is a 68 y.o. female with a diagnosis of Diabetes: Type 2.   ASSESSMENT  There were no vitals taken for this visit. There is no height or weight on file to calculate BMI.  Physician Note: -Hemoglobin A1c 6.8% on 08/04/2023 -Discussed the need for decreased hypoglycemic episodes -Patient encouraged to keep a food diary.  Referral placed to nutrition. -Will continue metformin 1000 mg twice daily. -Decrease glipizide from 5 to 2.5 mg in a.m. and continue 2.5 mg with dinner.  Discussed the importance of taking medication with food. -For continued symptoms discussed switching medication to [place] or Jardiance for cardiac protection. -Foot exam done this visit -Patient to schedule eye exam as last done 05/03/2022. -     Amb Referral to Nutrition and Diabetic Education -     glipiZIDE; Take half tab (2.5 mg) with breakfast and half a tab (2.5 mg) with dinner daily.  Dispense: 45 tablet; Refill: 0  DM medications: Farxiga  Metformin   Pt states she has her masters in food and nutrition. Pt states she has not had time to cook or be active due to a busy schedule but states now that she will be semi retired she will have more time to take better care of herself. Pt states she started doing aqua aerobics on Saturday.   DM medications: CGM: GMI 6.3; sensor use 87%, TIR 90%; 9% 181-250  Pt states due to her A1C being controlled and her academic background she does not desire follow up at this time: Dietitian advsied pt to call or email with any future questions or concerns.    Diabetes Self-Management Education - 12/03/23 1632       Visit Information   Visit Type First/Initial      Initial Visit   Diabetes Type Type 2    Are you currently following a meal plan? No    Are you taking your medications as  prescribed? Yes      Health Coping   How would you rate your overall health? Good      Psychosocial Assessment   Patient Belief/Attitude about Diabetes Motivated to manage diabetes    What is the hardest part about your diabetes right now, causing you the most concern, or is the most worrisome to you about your diabetes?   Making healty food and beverage choices    Self-management support Friends    Patient Concerns Nutrition/Meal planning    Special Needs None      Pre-Education Assessment   Patient understands the diabetes disease and treatment process. Needs Instruction    Patient understands incorporating nutritional management into lifestyle. Needs Instruction    Patient undertands incorporating physical activity into lifestyle. Needs Instruction    Patient understands using medications safely. Needs Instruction    Patient understands monitoring blood glucose, interpreting and using results Needs Instruction    Patient understands prevention, detection, and treatment of acute complications. Needs Instruction    Patient understands prevention, detection, and treatment of chronic complications. Needs Instruction    Patient understands how to develop strategies to address psychosocial issues. Needs Instruction    Patient understands how to develop strategies to promote health/change behavior. Needs Instruction      Complications   Last HgB A1C per patient/outside source 6.8 %    How often do you check your blood  sugar? > 4 times/day    Fasting Blood glucose range (mg/dL) [phone]    Postprandial Blood glucose range (mg/dL) [phone]      Dietary Intake   Breakfast 1-2 eggs + toast and maybe grits or sausage    Lunch salad or half peanu butter sandwich + orange marmelade (no sugar added)    Dinner lamb chop + small potatoes + greens    Beverage(s) water      Activity / Exercise   Activity / Exercise Type ADL's    How many days per week do you exercise? 0    How many minutes per  day do you exercise? 0    Total minutes per week of exercise 0      Patient Education   Previous Diabetes Education No    Disease Pathophysiology Definition of diabetes, type 1 and 2, and the diagnosis of diabetes;Factors that contribute to the development of diabetes    Healthy Eating Food label reading, portion sizes and measuring food.;Plate Method;Carbohydrate counting;Meal options for control of blood glucose level and chronic complications.;Reviewed blood glucose goals for pre and post meals and how to evaluate the patients' food intake on their blood glucose level.    Being Active Role of exercise on diabetes management, blood pressure control and cardiac health.    Medications Reviewed patients medication for diabetes, action, purpose, timing of dose and side effects.    Monitoring Daily foot exams;Taught/discussed recording of test results and interpretation of SMBG.;Taught/evaluated SMBG meter.    Acute complications Taught prevention, symptoms, and  treatment of hypoglycemia - the 15 rule.;Discussed and identified patients' prevention, symptoms, and treatment of hyperglycemia.    Chronic complications Dental care;Retinopathy and reason for yearly dilated eye exams;Nephropathy, what it is, prevention of, the use of ACE, ARB's and early detection of through urine microalbumia.;Lipid levels, blood glucose control and heart disease;Assessed and discussed foot care and prevention of foot problems    Diabetes Stress and Support Role of stress on diabetes    Lifestyle and Health Coping Lifestyle issues that need to be addressed for better diabetes care      Individualized Goals (developed by patient)   Nutrition General guidelines for healthy choices and portions discussed;Follow meal plan discussed    Physical Activity Exercise 5-7 days per week;30 minutes per day    Medications take my medication as prescribed    Monitoring  Test my blood glucose as discussed    Problem Solving Eating  Pattern    Reducing Risk do foot checks daily;treat hypoglycemia with 15 grams of carbs if blood glucose less than 70mg /dL      Post-Education Assessment   Patient understands the diabetes disease and treatment process. Demonstrates understanding / competency    Patient understands incorporating nutritional management into lifestyle. Demonstrates understanding / competency    Patient undertands incorporating physical activity into lifestyle. Demonstrates understanding / competency    Patient understands using medications safely. Demonstrates understanding / competency    Patient understands monitoring blood glucose, interpreting and using results Demonstrates understanding / competency    Patient understands prevention, detection, and treatment of chronic complications. Demonstrates understanding / competency    Patient understands how to develop strategies to address psychosocial issues. Demonstrates understanding / competency    Patient understands how to develop strategies to promote health/change behavior. Demonstrates understanding / competency      Outcomes   Expected Outcomes Demonstrated interest in learning. Expect positive outcomes    Future DMSE PRN    Program  Status Completed             Individualized Plan for Diabetes Self-Management Training:   Learning Objective:  Patient will have a greater understanding of diabetes self-management. Patient education plan is to attend individual and/or group sessions per assessed needs and concerns.  Expected Outcomes:  Demonstrated interest in learning. Expect positive outcomes  Education material provided: ADA - How to Thrive: A Guide for Your Journey with Diabetes, Food label handouts, Meal plan card, My Plate, Snack sheet, and Carbohydrate counting sheet  If problems or questions, patient to contact team via:  Phone and Email  Future DSME appointment: PRN

## 2023-12-12 DIAGNOSIS — J301 Allergic rhinitis due to pollen: Secondary | ICD-10-CM | POA: Diagnosis not present

## 2023-12-12 DIAGNOSIS — J3081 Allergic rhinitis due to animal (cat) (dog) hair and dander: Secondary | ICD-10-CM | POA: Diagnosis not present

## 2023-12-12 DIAGNOSIS — J3089 Other allergic rhinitis: Secondary | ICD-10-CM | POA: Diagnosis not present

## 2023-12-26 DIAGNOSIS — J301 Allergic rhinitis due to pollen: Secondary | ICD-10-CM | POA: Diagnosis not present

## 2023-12-26 DIAGNOSIS — J3089 Other allergic rhinitis: Secondary | ICD-10-CM | POA: Diagnosis not present

## 2023-12-26 DIAGNOSIS — J3081 Allergic rhinitis due to animal (cat) (dog) hair and dander: Secondary | ICD-10-CM | POA: Diagnosis not present

## 2024-01-01 ENCOUNTER — Encounter: Payer: Self-pay | Admitting: Family Medicine

## 2024-01-01 ENCOUNTER — Ambulatory Visit: Payer: Self-pay | Admitting: Family Medicine

## 2024-01-01 VITALS — BP 132/74 | HR 88 | Temp 98.3°F | Ht 63.5 in | Wt 187.0 lb

## 2024-01-01 DIAGNOSIS — E1169 Type 2 diabetes mellitus with other specified complication: Secondary | ICD-10-CM | POA: Diagnosis not present

## 2024-01-01 DIAGNOSIS — J3081 Allergic rhinitis due to animal (cat) (dog) hair and dander: Secondary | ICD-10-CM | POA: Diagnosis not present

## 2024-01-01 DIAGNOSIS — I10 Essential (primary) hypertension: Secondary | ICD-10-CM | POA: Diagnosis not present

## 2024-01-01 DIAGNOSIS — J301 Allergic rhinitis due to pollen: Secondary | ICD-10-CM | POA: Diagnosis not present

## 2024-01-01 DIAGNOSIS — Z7984 Long term (current) use of oral hypoglycemic drugs: Secondary | ICD-10-CM

## 2024-01-01 DIAGNOSIS — J3089 Other allergic rhinitis: Secondary | ICD-10-CM | POA: Diagnosis not present

## 2024-01-01 NOTE — Progress Notes (Signed)
Established Patient Office Visit   Subjective  Patient ID: Brittany Taylor, female    DOB: [birth year]  Age: 68 y.o. MRN: [id number]  Chief Complaint  Patient presents with   Medical Management of Chronic Issues    DM     Patient is a 68 year old female seen for follow-up chronic conditions.  Patient states she is feeling better since starting Farxiga 5 mg daily.  Also taking metformin 1000 mg twice daily.  At last visit glipizide 2.5 mg twice daily d/c'd 2/2 hypoglycemic episodes.  Patient states she is exercising daily and doing water aerobics.  Lost few pounds.  Feeling better overall.  States blood sugar was 125 this morning.  Endorses 2-3 lows around 60s overnight.  May not have eaten as much at dinner on those occasions.  Patient had a few higher blood sugar readings but attributes it to eating ice cream.    Patient Active Problem List   Diagnosis Date Noted   Fibroid, uterine 03/23/2018   Trigger finger, acquired 02/07/2015   Routine general medical examination at a health care facility 08/09/2014   Anemia, unspecified 07/24/2013   Nonspecific abnormal electrocardiogram (ECG) (EKG) 07/23/2013   Psoriasis 07/14/2012   Menopausal state 07/14/2012   Encounter for long-term (current) use of other medications 06/17/2011   GOITER, MULTINODULAR 03/06/2009   LEUKOPENIA, CHRONIC 03/06/2009   Asymptomatic menopausal state 03/06/2009   TRIGGER FINGER, RIGHT THUMB 02/25/2008   HSV 08/22/2007   Diabetes (HCC) 08/22/2007   HYPERLIPIDEMIA 08/22/2007   DEPRESSION 08/22/2007   ALLERGIC RHINITIS 08/22/2007   ANGIOEDEMA 08/22/2007   Past Medical History:  Diagnosis Date   Allergy-induced asthma    daily inhaler   Anxiety    Arthritis    hands   Dental crown present    Depression    GERD (gastroesophageal reflux disease)    High cholesterol    Hypertension    states under control with med., has been on med. x 10 yr.   Non-insulin dependent type 2 diabetes mellitus (HCC)    PONV  (postoperative nausea and vomiting)    patient thinks related to Morphine   Psoriasis    arms, bilateral leg, back, breast, neck, ears   Stenosing tenosynovitis of finger of right hand 03/2015   right ring finger   Past Surgical History:  Procedure Laterality Date   BREAST REDUCTION SURGERY  2000   CYSTOSCOPY N/A 03/23/2018   Procedure: CYSTOSCOPY;  Surgeon: Silverio Lay, MD;  Location: [facility];  Service: Gynecology;  Laterality: N/A;   LEEP  03/18/2003   MYOMECTOMY  1995   POLYPECTOMY  01/2015   uterine   REDUCTION MAMMAPLASTY     2000   SUPRACERVICAL ABDOMINAL HYSTERECTOMY Bilateral 03/23/2018   Procedure: HYSTERECTOMY SUPRACERVICAL ABDOMINAL;  Surgeon: Silverio Lay, MD;  Location: [facility];  Service: Gynecology;  Laterality: Bilateral;   TRIGGER FINGER RELEASE Left 06/15/2009   thumb   TRIGGER FINGER RELEASE Right 11/15/2008   thumb   TRIGGER FINGER RELEASE Right 04/13/2015   Procedure: RELEASE A-1 PULLEY RIGHT RING FINGER;  Surgeon: Cindee Salt, MD;  Location: [facility];  Service: Orthopedics;  Laterality: Right;   WISDOM TOOTH EXTRACTION  1972   Social History   Tobacco Use   Smoking status: Never   Smokeless tobacco: Never  Vaping Use   Vaping status: Never Used  Substance Use Topics   Alcohol use: Yes    Comment: occasionally   Drug use: No   Family History  Problem  Relation Age of Onset   Diabetes Mother    Diabetes Father    Stroke Father    Hypertension Father    Glaucoma Father    Colon cancer Neg Hx    Esophageal cancer Neg Hx    Rectal cancer Neg Hx    Stomach cancer Neg Hx    Allergies  Allergen Reactions   Ace Inhibitors Other (See Comments)    ANGIOEDEMA    Morphine Itching and Nausea And Vomiting   Other Swelling   Ramipril Other (See Comments)    ANGIOEDEMA   Methimazole Other (See Comments)    Unknown reaction   Trichophyton Cough, Itching and Other (See Comments)    Throat itches   Latex Rash    Used latex gloves with  powder, rash occured   Mercurochrome [Merbromin] Rash      ROS Negative unless stated above    Objective:     BP 132/74 (BP Location: Left Arm, Patient Position: Sitting, Cuff Size: Large)   Pulse 88   Temp 98.3 F (36.8 C) (Oral)   Ht 5' 3.5" (1.613 m)   Wt 187 lb (84.8 kg)   SpO2 94%   BMI 32.61 kg/m  BP Readings from Last 3 Encounters:  01/01/24 132/74  11/07/23 138/72  09/29/23 136/80   Wt Readings from Last 3 Encounters:  01/01/24 187 lb (84.8 kg)  11/07/23 189 lb 12.8 oz (86.1 kg)  09/29/23 188 lb 3.2 oz (85.4 kg)      Physical Exam Constitutional:      General: She is not in acute distress.    Appearance: Normal appearance.  HENT:     Head: Normocephalic and atraumatic.     Nose: Nose normal.     Mouth/Throat:     Mouth: Mucous membranes are moist.  Cardiovascular:     Rate and Rhythm: Normal rate and regular rhythm.     Heart sounds: Normal heart sounds. No murmur heard.    No gallop.  Pulmonary:     Effort: Pulmonary effort is normal.     Breath sounds: Normal breath sounds.  Skin:    General: Skin is warm and dry.  Neurological:     Mental Status: She is alert and oriented to person, place, and time. Mental status is at baseline.    No results found for any visits on 01/01/24.    Assessment & Plan:  Type 2 diabetes mellitus with other specified complication, without long-term current use of insulin (HCC)  Essential hypertension  Patient seen for follow-up on chronic conditions.  Hemoglobin A1c 6.8% on 08/04/2023.  Improvement in blood sugar since starting Farxiga 5 mg daily.  Continue Farxiga 5 mg daily and metformin 1000 mg twice daily.  If continues to have hypoglycemic episodes at night discussed taking metformin 1000 mg in a.m. and 500 mg in p.m.  Obtain microalbumin creatinine ratio at next OFV.  Unable to void this visit.  BP controlled.  Continue triamterene-hydrochlorothiazide 37.5-25 mg.  Return in about 3 months (around 03/30/2024).    Deeann Saint, MD

## 2024-01-12 DIAGNOSIS — Z13220 Encounter for screening for lipoid disorders: Secondary | ICD-10-CM | POA: Diagnosis not present

## 2024-01-12 DIAGNOSIS — L409 Psoriasis, unspecified: Secondary | ICD-10-CM | POA: Diagnosis not present

## 2024-01-15 DIAGNOSIS — J3089 Other allergic rhinitis: Secondary | ICD-10-CM | POA: Diagnosis not present

## 2024-01-15 DIAGNOSIS — J3081 Allergic rhinitis due to animal (cat) (dog) hair and dander: Secondary | ICD-10-CM | POA: Diagnosis not present

## 2024-01-15 DIAGNOSIS — J301 Allergic rhinitis due to pollen: Secondary | ICD-10-CM | POA: Diagnosis not present

## 2024-01-22 DIAGNOSIS — J3081 Allergic rhinitis due to animal (cat) (dog) hair and dander: Secondary | ICD-10-CM | POA: Diagnosis not present

## 2024-01-22 DIAGNOSIS — J301 Allergic rhinitis due to pollen: Secondary | ICD-10-CM | POA: Diagnosis not present

## 2024-01-22 DIAGNOSIS — J3089 Other allergic rhinitis: Secondary | ICD-10-CM | POA: Diagnosis not present

## 2024-01-29 DIAGNOSIS — J3081 Allergic rhinitis due to animal (cat) (dog) hair and dander: Secondary | ICD-10-CM | POA: Diagnosis not present

## 2024-01-29 DIAGNOSIS — J301 Allergic rhinitis due to pollen: Secondary | ICD-10-CM | POA: Diagnosis not present

## 2024-01-29 DIAGNOSIS — J3089 Other allergic rhinitis: Secondary | ICD-10-CM | POA: Diagnosis not present

## 2024-02-04 DIAGNOSIS — J3081 Allergic rhinitis due to animal (cat) (dog) hair and dander: Secondary | ICD-10-CM | POA: Diagnosis not present

## 2024-02-04 DIAGNOSIS — J3089 Other allergic rhinitis: Secondary | ICD-10-CM | POA: Diagnosis not present

## 2024-02-04 DIAGNOSIS — J301 Allergic rhinitis due to pollen: Secondary | ICD-10-CM | POA: Diagnosis not present

## 2024-02-11 DIAGNOSIS — J301 Allergic rhinitis due to pollen: Secondary | ICD-10-CM | POA: Diagnosis not present

## 2024-02-11 DIAGNOSIS — J3089 Other allergic rhinitis: Secondary | ICD-10-CM | POA: Diagnosis not present

## 2024-02-11 DIAGNOSIS — J3081 Allergic rhinitis due to animal (cat) (dog) hair and dander: Secondary | ICD-10-CM | POA: Diagnosis not present

## 2024-02-13 ENCOUNTER — Telehealth: Payer: Self-pay

## 2024-02-13 NOTE — Progress Notes (Signed)
   02/13/2024  Patient ID: Brittany Taylor, female   DOB: 10-17-1956, 67 y.o.   MRN: [id number]  Pharmacy Quality Measure Review  This patient is appearing on a report for being at risk of failing the adherence measure for diabetes medications this calendar year.   Medication: Marcelline Deist, Metformin Last fill date for both: 12/29/23 for 30 day supply  Spoke with patient. She denies any missed doses of medications. Was given samples of Farxiga at one point due to insurance issue that has resolved. States she has at least one week left on both but needs to move up her appt with PCP to be seen before she runs out.   R/s patient PCP appt to 02/19/24 at patient request for morning appt. Patient also concerned for updated lipid panel at derm on 01/12/24, reports she was not fasting and would like an update at this appt if possible.  Would recommend starting a statin if still elevated. Counseled patient on importance of adherence and to contact us right away if any cost concerns arise.  Sherrill Raring, PharmD Clinical Pharmacist [phone]

## 2024-02-19 ENCOUNTER — Encounter: Payer: Self-pay | Admitting: Family Medicine

## 2024-02-19 ENCOUNTER — Ambulatory Visit (INDEPENDENT_AMBULATORY_CARE_PROVIDER_SITE_OTHER): Admitting: Family Medicine

## 2024-02-19 VITALS — BP 128/72 | HR 78 | Temp 98.5°F | Ht 63.5 in | Wt 183.8 lb

## 2024-02-19 DIAGNOSIS — R5383 Other fatigue: Secondary | ICD-10-CM

## 2024-02-19 DIAGNOSIS — I10 Essential (primary) hypertension: Secondary | ICD-10-CM | POA: Diagnosis not present

## 2024-02-19 DIAGNOSIS — E1169 Type 2 diabetes mellitus with other specified complication: Secondary | ICD-10-CM | POA: Diagnosis not present

## 2024-02-19 DIAGNOSIS — R7989 Other specified abnormal findings of blood chemistry: Secondary | ICD-10-CM

## 2024-02-19 DIAGNOSIS — J3089 Other allergic rhinitis: Secondary | ICD-10-CM | POA: Diagnosis not present

## 2024-02-19 DIAGNOSIS — J3081 Allergic rhinitis due to animal (cat) (dog) hair and dander: Secondary | ICD-10-CM | POA: Diagnosis not present

## 2024-02-19 DIAGNOSIS — E1136 Type 2 diabetes mellitus with diabetic cataract: Secondary | ICD-10-CM | POA: Diagnosis not present

## 2024-02-19 DIAGNOSIS — E669 Obesity, unspecified: Secondary | ICD-10-CM | POA: Diagnosis not present

## 2024-02-19 DIAGNOSIS — E785 Hyperlipidemia, unspecified: Secondary | ICD-10-CM | POA: Diagnosis not present

## 2024-02-19 DIAGNOSIS — G8929 Other chronic pain: Secondary | ICD-10-CM | POA: Diagnosis not present

## 2024-02-19 DIAGNOSIS — L409 Psoriasis, unspecified: Secondary | ICD-10-CM | POA: Diagnosis not present

## 2024-02-19 DIAGNOSIS — J309 Allergic rhinitis, unspecified: Secondary | ICD-10-CM | POA: Diagnosis not present

## 2024-02-19 DIAGNOSIS — Z7984 Long term (current) use of oral hypoglycemic drugs: Secondary | ICD-10-CM

## 2024-02-19 DIAGNOSIS — J45909 Unspecified asthma, uncomplicated: Secondary | ICD-10-CM | POA: Diagnosis not present

## 2024-02-19 DIAGNOSIS — J301 Allergic rhinitis due to pollen: Secondary | ICD-10-CM | POA: Diagnosis not present

## 2024-02-19 DIAGNOSIS — F3342 Major depressive disorder, recurrent, in full remission: Secondary | ICD-10-CM | POA: Diagnosis not present

## 2024-02-19 DIAGNOSIS — K5900 Constipation, unspecified: Secondary | ICD-10-CM | POA: Diagnosis not present

## 2024-02-19 DIAGNOSIS — L299 Pruritus, unspecified: Secondary | ICD-10-CM | POA: Diagnosis not present

## 2024-02-19 LAB — T4, FREE: Free T4: 0.62 ng/dL (ref 0.60–1.60)

## 2024-02-19 LAB — BASIC METABOLIC PANEL WITH GFR
BUN: 18 mg/dL (ref 6–23)
CO2: 30 meq/L (ref 19–32)
Calcium: 9.5 mg/dL (ref 8.4–10.5)
Chloride: 102 meq/L (ref 96–112)
Creatinine, Ser: 0.75 mg/dL (ref 0.40–1.20)
GFR: 82.32 mL/min (ref >60.00)
Glucose, Bld: 103 mg/dL — ABNORMAL HIGH (ref 70–99)
Potassium: 4.2 meq/L (ref 3.5–5.1)
Sodium: 137 meq/L (ref 135–145)

## 2024-02-19 LAB — VITAMIN B12: Vitamin B-12: 534 pg/mL (ref 211–911)

## 2024-02-19 LAB — MICROALBUMIN / CREATININE URINE RATIO
Creatinine,U: 127.3 mg/dL
Microalb Creat Ratio: UNDETERMINED mg/g (ref 0.0–30.0)
Microalb, Ur: <0.7 mg/dL

## 2024-02-19 LAB — VITAMIN D 25 HYDROXY (VIT D DEFICIENCY, FRACTURES): VITD: 35.67 ng/mL (ref 30.00–100.00)

## 2024-02-19 LAB — TSH: TSH: 1 u[IU]/mL (ref 0.35–5.50)

## 2024-02-19 MED ORDER — METFORMIN HCL 1000 MG PO TABS
1000.0000 mg | ORAL_TABLET | Freq: Every day | ORAL | 3 refills | Status: AC
Start: 2024-02-19 — End: ?

## 2024-02-19 NOTE — Patient Instructions (Signed)
 At today's visit we discussed stopping the metformin 500 mg in the evening.  Continue Farxiga 5 mg in a.m. and metformin 1000 mg in a.m.  Labs were placed to recheck your thyroid.

## 2024-02-19 NOTE — Progress Notes (Signed)
 Established Patient Office Visit   Subjective  Patient ID: Brittany Taylor, female    DOB: [birth year]  Age: 68 y.o. MRN: [id number]  Chief Complaint  Patient presents with   Medical Management of Chronic Issues    Patient was seen at Dermatology a month ago and labs were elevated lipid-232 LDL-137 A1c-6.6, patient would like to do more labs today and refill Medication     Patient is a 68 year old female seen for follow-up.  Patient states she had lipids and A1c checked 01/12/2024 at dermatology office.  On Skyrizi, notes improvement in psoriasis.  Per chart reviewed total cholesterol 232, HDL 137, A1c 6.6%.  Patient currently taking metformin 1000 mg in a.m. and 500 mg in p.m. and Farxiga 5 mg in a.m.  Patient having low blood sugars overnight in the 60s.  Awakened by CGM.  Has not been confirming readings with manual glucometer.  Typically has an early dinner around 6 PM.  Highest blood sugar 256 midday after eating kettle corn.  Blood pressure has been good.  Patient has questions about thyroid.  States labs done with OB/GYN, Dr[facility] Layne B[facility] at [facility] were abnormal over the summer.  Patient does not recall the numbers/unable to pull up on patient portal.    Patient Active Problem List   Diagnosis Date Noted   Fibroid, uterine 03/23/2018   Trigger finger, acquired 02/07/2015   Routine general medical examination at a health care facility 08/09/2014   Anemia, unspecified 07/24/2013   Nonspecific abnormal electrocardiogram (ECG) (EKG) 07/23/2013   Psoriasis 07/14/2012   Menopausal state 07/14/2012   Encounter for long-term (current) use of other medications 06/17/2011   GOITER, MULTINODULAR 03/06/2009   LEUKOPENIA, CHRONIC 03/06/2009   Asymptomatic menopausal state 03/06/2009   TRIGGER FINGER, RIGHT THUMB 02/25/2008   HSV 08/22/2007   Diabetes (HCC) 08/22/2007   HYPERLIPIDEMIA 08/22/2007   DEPRESSION 08/22/2007   ALLERGIC RHINITIS 08/22/2007   ANGIOEDEMA 08/22/2007   Past  Medical History:  Diagnosis Date   Allergy-induced asthma    daily inhaler   Anxiety    Arthritis    hands   Dental crown present    Depression    GERD (gastroesophageal reflux disease)    High cholesterol    Hypertension    states under control with med., has been on med. x 10 yr.   Non-insulin dependent type 2 diabetes mellitus (HCC)    PONV (postoperative nausea and vomiting)    patient thinks related to Morphine   Psoriasis    arms, bilateral leg, back, breast, neck, ears   Stenosing tenosynovitis of finger of right hand 03/2015   right ring finger   Past Surgical History:  Procedure Laterality Date   BREAST REDUCTION SURGERY  2000   CYSTOSCOPY N/A 03/23/2018   Procedure: CYSTOSCOPY;  Surgeon: Silverio Lay, MD;  Location: [facility];  Service: Gynecology;  Laterality: N/A;   LEEP  03/18/2003   MYOMECTOMY  1995   POLYPECTOMY  01/2015   uterine   REDUCTION MAMMAPLASTY     2000   SUPRACERVICAL ABDOMINAL HYSTERECTOMY Bilateral 03/23/2018   Procedure: HYSTERECTOMY SUPRACERVICAL ABDOMINAL;  Surgeon: Silverio Lay, MD;  Location: [facility];  Service: Gynecology;  Laterality: Bilateral;   TRIGGER FINGER RELEASE Left 06/15/2009   thumb   TRIGGER FINGER RELEASE Right 11/15/2008   thumb   TRIGGER FINGER RELEASE Right 04/13/2015   Procedure: RELEASE A-1 PULLEY RIGHT RING FINGER;  Surgeon: Cindee Salt, MD;  Location: [facility];  Service: Orthopedics;  Laterality: Right;   WISDOM TOOTH EXTRACTION  1972   Social History   Tobacco Use   Smoking status: Never   Smokeless tobacco: Never  Vaping Use   Vaping status: Never Used  Substance Use Topics   Alcohol use: Yes    Comment: occasionally   Drug use: No   Family History  Problem Relation Age of Onset   Diabetes Mother    Diabetes Father    Stroke Father    Hypertension Father    Glaucoma Father    Colon cancer Neg Hx    Esophageal cancer Neg Hx    Rectal cancer Neg Hx    Stomach cancer Neg Hx    Allergies   Allergen Reactions   Ace Inhibitors Other (See Comments)    ANGIOEDEMA    Morphine Itching, Nausea And Vomiting and Other (See Comments)   Other Swelling and Other (See Comments)   Ramipril Other (See Comments)    ANGIOEDEMA   Methimazole Other (See Comments)    Unknown reaction   Trichophyton Cough, Itching and Other (See Comments)    Throat itches   Latex Rash and Other (See Comments)    Used latex gloves with powder, rash occured   Merbromin Rash and Other (See Comments)    merbromin      ROS Negative unless stated above    Objective:     BP 128/72 (BP Location: Left Arm, Patient Position: Sitting, Cuff Size: Normal)   Pulse 78   Temp 98.5 F (36.9 C) (Oral)   Ht 5' 3.5" (1.613 m)   Wt 183 lb 12.8 oz (83.4 kg)   SpO2 97%   BMI 32.05 kg/m  BP Readings from Last 3 Encounters:  02/19/24 128/72  01/01/24 132/74  11/07/23 138/72   Wt Readings from Last 3 Encounters:  02/19/24 183 lb 12.8 oz (83.4 kg)  01/01/24 187 lb (84.8 kg)  11/07/23 189 lb 12.8 oz (86.1 kg)      Physical Exam Constitutional:      General: She is not in acute distress.    Appearance: Normal appearance.  HENT:     Head: Normocephalic and atraumatic.     Nose: Nose normal.     Mouth/Throat:     Mouth: Mucous membranes are moist.  Cardiovascular:     Rate and Rhythm: Normal rate and regular rhythm.     Heart sounds: Normal heart sounds. No murmur heard.    No gallop.  Pulmonary:     Effort: Pulmonary effort is normal. No respiratory distress.     Breath sounds: Normal breath sounds. No wheezing, rhonchi or rales.  Skin:    General: Skin is warm and dry.     Comments: Hyperpigmentation of bilateral UEs.  Psoriasis plaques on bilateral elbows.  Neurological:     Mental Status: She is alert and oriented to person, place, and time.        02/19/2024    9:28 AM 01/01/2024    3:26 PM 12/03/2023    4:30 PM  Depression screen PHQ 2/9  Decreased Interest 0 0 0  Down, Depressed,  Hopeless 0 1 0  PHQ - 2 Score 0 1 0  Altered sleeping 0 0   Tired, decreased energy 1 0   Change in appetite 0 0   Feeling bad or failure about yourself  0 0   Trouble concentrating 0 0   Moving slowly or fidgety/restless 0 0   Suicidal thoughts 0 0   PHQ-9 Score  1 1   Difficult doing work/chores Not difficult at all Not difficult at all       02/19/2024    9:28 AM 01/01/2024    3:26 PM 08/16/2020    4:29 PM 01/22/2019    4:06 PM  GAD 7 : Generalized Anxiety Score  Nervous, Anxious, on Edge 0 0 1 1  Control/stop worrying 0 0 1 0  Worry too much - different things 0 0 1 0  Trouble relaxing 0 0 1 1  Restless 0 0 0 0  Easily annoyed or irritable 0 0 0 0  Afraid - awful might happen 0 0 1 0  Total GAD 7 Score 0 0 5 2  Anxiety Difficulty    Not difficult at all    No results found for any visits on 02/19/24.    Assessment & Plan:  Type 2 diabetes mellitus with other specified complication, without long-term current use of insulin (HCC) -     Microalbumin / creatinine urine ratio -     metFORMIN HCl; Take 1 tablet (1,000 mg total) by mouth daily with breakfast.  Dispense: 90 tablet; Refill: 3 -     Vitamin B12  Essential hypertension -     TSH -     T4, free -     Basic metabolic panel with GFR  Abnormal TSH -     TSH -     T4, free  Fatigue, unspecified type -     VITAMIN D 25 Hydroxy (Vit-D Deficiency, Fractures) -     Vitamin B12   A1c controlled at 6.6% per recent A1c on 01/12/2024 at dermatology.  Given several low blood sugar readings overnight in the 60s.  Metformin 500 mg in evening.  Continue metformin 1000 mg in a.m. and Farxiga 5 mg in a.m.  Continue to monitor blood sugars and modify diet.  BP well-controlled.  Continue current medications including triamterene-hydrochlorothiazide.  Will recheck thyroid function given reported history of abnormal reading.  Will also check vitamin B12 and vitamin D given history of fatigue and metformin use.  Return in about 3  months (around 05/20/2024).   Deeann Saint, MD

## 2024-02-23 ENCOUNTER — Encounter: Payer: Self-pay | Admitting: Family Medicine

## 2024-02-25 DIAGNOSIS — J3089 Other allergic rhinitis: Secondary | ICD-10-CM | POA: Diagnosis not present

## 2024-02-25 DIAGNOSIS — J3081 Allergic rhinitis due to animal (cat) (dog) hair and dander: Secondary | ICD-10-CM | POA: Diagnosis not present

## 2024-02-25 DIAGNOSIS — J301 Allergic rhinitis due to pollen: Secondary | ICD-10-CM | POA: Diagnosis not present

## 2024-02-26 ENCOUNTER — Other Ambulatory Visit: Payer: Self-pay | Admitting: Family Medicine

## 2024-02-26 DIAGNOSIS — I10 Essential (primary) hypertension: Secondary | ICD-10-CM

## 2024-02-26 DIAGNOSIS — F339 Major depressive disorder, recurrent, unspecified: Secondary | ICD-10-CM

## 2024-03-11 ENCOUNTER — Ambulatory Visit: Payer: No Typology Code available for payment source | Admitting: Family Medicine

## 2024-03-11 DIAGNOSIS — J301 Allergic rhinitis due to pollen: Secondary | ICD-10-CM | POA: Diagnosis not present

## 2024-03-11 DIAGNOSIS — J3081 Allergic rhinitis due to animal (cat) (dog) hair and dander: Secondary | ICD-10-CM | POA: Diagnosis not present

## 2024-03-11 DIAGNOSIS — J3089 Other allergic rhinitis: Secondary | ICD-10-CM | POA: Diagnosis not present

## 2024-03-12 ENCOUNTER — Other Ambulatory Visit: Payer: Self-pay | Admitting: Family Medicine

## 2024-03-18 ENCOUNTER — Telehealth: Payer: Self-pay | Admitting: *Deleted

## 2024-03-18 MED ORDER — FREESTYLE LIBRE 3 PLUS SENSOR MISC: 3 refills | Status: DC

## 2024-03-18 NOTE — Telephone Encounter (Signed)
 Copied from CRM [id number]. Topic: Clinical - Prescription Issue >> Mar 18, 2024  1:04 PM Jenice Mitts wrote: Reason for CRM: Patient is calling in because the freestyle libre 3 has been discontinued and now they only carry the freestyle libre 3 plus. She is calling to see if a new prescription can be put in with her pharmacy with the freestyle libre 3 plus

## 2024-03-19 DIAGNOSIS — J301 Allergic rhinitis due to pollen: Secondary | ICD-10-CM | POA: Diagnosis not present

## 2024-03-19 DIAGNOSIS — J3081 Allergic rhinitis due to animal (cat) (dog) hair and dander: Secondary | ICD-10-CM | POA: Diagnosis not present

## 2024-03-19 DIAGNOSIS — J3089 Other allergic rhinitis: Secondary | ICD-10-CM | POA: Diagnosis not present

## 2024-03-31 DIAGNOSIS — J301 Allergic rhinitis due to pollen: Secondary | ICD-10-CM | POA: Diagnosis not present

## 2024-03-31 DIAGNOSIS — J3081 Allergic rhinitis due to animal (cat) (dog) hair and dander: Secondary | ICD-10-CM | POA: Diagnosis not present

## 2024-03-31 DIAGNOSIS — J3089 Other allergic rhinitis: Secondary | ICD-10-CM | POA: Diagnosis not present

## 2024-04-05 DIAGNOSIS — J301 Allergic rhinitis due to pollen: Secondary | ICD-10-CM | POA: Diagnosis not present

## 2024-04-05 DIAGNOSIS — J3081 Allergic rhinitis due to animal (cat) (dog) hair and dander: Secondary | ICD-10-CM | POA: Diagnosis not present

## 2024-04-05 DIAGNOSIS — J3089 Other allergic rhinitis: Secondary | ICD-10-CM | POA: Diagnosis not present

## 2024-04-15 DIAGNOSIS — J3089 Other allergic rhinitis: Secondary | ICD-10-CM | POA: Diagnosis not present

## 2024-04-15 DIAGNOSIS — J301 Allergic rhinitis due to pollen: Secondary | ICD-10-CM | POA: Diagnosis not present

## 2024-04-15 DIAGNOSIS — J3081 Allergic rhinitis due to animal (cat) (dog) hair and dander: Secondary | ICD-10-CM | POA: Diagnosis not present

## 2024-04-19 ENCOUNTER — Ambulatory Visit
Admission: RE | Admit: 2024-04-19 | Discharge: 2024-04-19 | Disposition: A | Discharge status: Home / Self Care | Payer: No Typology Code available for payment source | Source: Ambulatory Visit | Attending: Obstetrics and Gynecology | Admitting: Obstetrics and Gynecology

## 2024-04-19 DIAGNOSIS — Z13820 Encounter for screening for osteoporosis: Secondary | ICD-10-CM

## 2024-04-22 DIAGNOSIS — J3081 Allergic rhinitis due to animal (cat) (dog) hair and dander: Secondary | ICD-10-CM | POA: Diagnosis not present

## 2024-04-22 DIAGNOSIS — J301 Allergic rhinitis due to pollen: Secondary | ICD-10-CM | POA: Diagnosis not present

## 2024-04-22 DIAGNOSIS — J3089 Other allergic rhinitis: Secondary | ICD-10-CM | POA: Diagnosis not present

## 2024-04-29 DIAGNOSIS — J3081 Allergic rhinitis due to animal (cat) (dog) hair and dander: Secondary | ICD-10-CM | POA: Diagnosis not present

## 2024-04-29 DIAGNOSIS — J3089 Other allergic rhinitis: Secondary | ICD-10-CM | POA: Diagnosis not present

## 2024-04-29 DIAGNOSIS — J301 Allergic rhinitis due to pollen: Secondary | ICD-10-CM | POA: Diagnosis not present

## 2024-05-03 ENCOUNTER — Encounter: Payer: Self-pay | Admitting: Obstetrics and Gynecology

## 2024-05-03 ENCOUNTER — Other Ambulatory Visit: Payer: Self-pay | Admitting: Obstetrics and Gynecology

## 2024-05-03 DIAGNOSIS — Z1231 Encounter for screening mammogram for malignant neoplasm of breast: Secondary | ICD-10-CM

## 2024-05-05 DIAGNOSIS — J3089 Other allergic rhinitis: Secondary | ICD-10-CM | POA: Diagnosis not present

## 2024-05-05 DIAGNOSIS — J301 Allergic rhinitis due to pollen: Secondary | ICD-10-CM | POA: Diagnosis not present

## 2024-05-05 DIAGNOSIS — J3081 Allergic rhinitis due to animal (cat) (dog) hair and dander: Secondary | ICD-10-CM | POA: Diagnosis not present

## 2024-05-13 DIAGNOSIS — J3089 Other allergic rhinitis: Secondary | ICD-10-CM | POA: Diagnosis not present

## 2024-05-13 DIAGNOSIS — J3081 Allergic rhinitis due to animal (cat) (dog) hair and dander: Secondary | ICD-10-CM | POA: Diagnosis not present

## 2024-05-13 DIAGNOSIS — J301 Allergic rhinitis due to pollen: Secondary | ICD-10-CM | POA: Diagnosis not present

## 2024-05-17 DIAGNOSIS — H33322 Round hole, left eye: Secondary | ICD-10-CM | POA: Diagnosis not present

## 2024-05-17 DIAGNOSIS — H2513 Age-related nuclear cataract, bilateral: Secondary | ICD-10-CM | POA: Diagnosis not present

## 2024-05-17 DIAGNOSIS — E113293 Type 2 diabetes mellitus with mild nonproliferative diabetic retinopathy without macular edema, bilateral: Secondary | ICD-10-CM | POA: Diagnosis not present

## 2024-05-17 DIAGNOSIS — H35033 Hypertensive retinopathy, bilateral: Secondary | ICD-10-CM | POA: Diagnosis not present

## 2024-05-17 DIAGNOSIS — H33193 Other retinoschisis and retinal cysts, bilateral: Secondary | ICD-10-CM | POA: Diagnosis not present

## 2024-05-17 DIAGNOSIS — H43823 Vitreomacular adhesion, bilateral: Secondary | ICD-10-CM | POA: Diagnosis not present

## 2024-05-19 DIAGNOSIS — J301 Allergic rhinitis due to pollen: Secondary | ICD-10-CM | POA: Diagnosis not present

## 2024-05-19 DIAGNOSIS — J3081 Allergic rhinitis due to animal (cat) (dog) hair and dander: Secondary | ICD-10-CM | POA: Diagnosis not present

## 2024-05-19 DIAGNOSIS — J3089 Other allergic rhinitis: Secondary | ICD-10-CM | POA: Diagnosis not present

## 2024-05-24 ENCOUNTER — Other Ambulatory Visit: Payer: Self-pay | Admitting: Family Medicine

## 2024-05-24 DIAGNOSIS — I10 Essential (primary) hypertension: Secondary | ICD-10-CM

## 2024-05-24 DIAGNOSIS — F339 Major depressive disorder, recurrent, unspecified: Secondary | ICD-10-CM

## 2024-05-26 DIAGNOSIS — J3081 Allergic rhinitis due to animal (cat) (dog) hair and dander: Secondary | ICD-10-CM | POA: Diagnosis not present

## 2024-05-26 DIAGNOSIS — J3089 Other allergic rhinitis: Secondary | ICD-10-CM | POA: Diagnosis not present

## 2024-05-26 DIAGNOSIS — J301 Allergic rhinitis due to pollen: Secondary | ICD-10-CM | POA: Diagnosis not present

## 2024-05-27 ENCOUNTER — Ambulatory Visit (INDEPENDENT_AMBULATORY_CARE_PROVIDER_SITE_OTHER): Admitting: Family Medicine

## 2024-05-27 ENCOUNTER — Other Ambulatory Visit: Payer: Self-pay | Admitting: Family Medicine

## 2024-05-27 ENCOUNTER — Encounter: Payer: Self-pay | Admitting: Family Medicine

## 2024-05-27 VITALS — BP 122/84 | HR 73 | Temp 98.4°F | Ht 63.5 in | Wt 182.4 lb

## 2024-05-27 DIAGNOSIS — Z7984 Long term (current) use of oral hypoglycemic drugs: Secondary | ICD-10-CM

## 2024-05-27 DIAGNOSIS — E538 Deficiency of other specified B group vitamins: Secondary | ICD-10-CM | POA: Diagnosis not present

## 2024-05-27 DIAGNOSIS — L0291 Cutaneous abscess, unspecified: Secondary | ICD-10-CM | POA: Diagnosis not present

## 2024-05-27 DIAGNOSIS — E1169 Type 2 diabetes mellitus with other specified complication: Secondary | ICD-10-CM | POA: Diagnosis not present

## 2024-05-27 DIAGNOSIS — R899 Unspecified abnormal finding in specimens from other organs, systems and tissues: Secondary | ICD-10-CM | POA: Diagnosis not present

## 2024-05-27 DIAGNOSIS — E559 Vitamin D deficiency, unspecified: Secondary | ICD-10-CM | POA: Diagnosis not present

## 2024-05-27 DIAGNOSIS — I10 Essential (primary) hypertension: Secondary | ICD-10-CM

## 2024-05-27 DIAGNOSIS — F339 Major depressive disorder, recurrent, unspecified: Secondary | ICD-10-CM

## 2024-05-27 LAB — VITAMIN B12: Vitamin B-12: 740 pg/mL (ref 211–911)

## 2024-05-27 LAB — VITAMIN D 25 HYDROXY (VIT D DEFICIENCY, FRACTURES): VITD: 41.35 ng/mL (ref 30.00–100.00)

## 2024-05-27 LAB — T4, FREE: Free T4: 0.68 ng/dL (ref 0.60–1.60)

## 2024-05-27 LAB — HEMOGLOBIN A1C: Hgb A1c MFr Bld: 6.9 % — ABNORMAL HIGH (ref 4.6–6.5)

## 2024-05-27 LAB — TSH: TSH: 1.83 u[IU]/mL (ref 0.35–5.50)

## 2024-05-27 NOTE — Telephone Encounter (Signed)
 Copied from CRM [id number]. Topic: Clinical - Medication Refill >> May 27, 2024 12:43 PM Brittany Taylor wrote: Medication: triamterene -hydrochlorothiazide  (MAXZIDE -25) 37.5-25 MG tablet   FLUoxetine  (PROZAC ) 20 MG capsule  Has the patient contacted their pharmacy? Yes Needs the meds renewed   This is the patient's preferred pharmacy:  [facility] [address] [place] - [address] [name] [place] [address] Phone: [phone] Fax: [phone]  Is this the correct pharmacy for this prescription? Yes If no, delete pharmacy and type the correct one.   Has the prescription been filled recently? Yes  Is the patient out of the medication? No  Has the patient been seen for an appointment in the last year OR does the patient have an upcoming appointment? Yes  Can we respond through MyChart? Yes  Agent: Please be advised that Rx refills may take up to 3 business days. We ask that you follow-up with your pharmacy.

## 2024-05-27 NOTE — Progress Notes (Signed)
 Established Patient Office Visit   Subjective  Patient ID: Brittany Taylor, female    DOB: 09/10/1956  Age: 68 y.o. MRN: [id number]  Chief Complaint  Patient presents with   Medical Management of Chronic Issues    3 Month follow-up for Diabetes, Recheck Thyroid  and B-12 and Vit D     Patient is a 68 year old female seen for follow-up.  Patient states she has been doing much better since last OFV.  No longer having fatigue after vitamin B12 and vitamin D  repleted.  Patient no longer having numerous hypoglycemic episodes since stopping evening dose of metformin  500 mg.  Currently taking Farxiga  5 mg and metformin  1000 mg in AM.  Hypoglycemia after exercising and occasionally around 2 AM.  Eats prior to exercising and takes a snack with her.  Pt alerted by CGM.  Has dinner around 5 PM but no later than 7 PM.  In bed by 11 PM.  Blood sugar averages 90-100.  Patient notes having a boil in right axilla x 2 weeks.  Was painful.  No drainage.  Using warm compresses, antibiotic ointment, and witch hazel which have helped.  Now smaller in size and nontender.  Still without drainage.  Pt changed deodorant a month ago.  Was using [facility], switched to Lume spray.     Patient Active Problem List   Diagnosis Date Noted   Fibroid, uterine 03/23/2018   Trigger finger, acquired 02/07/2015   Routine general medical examination at a health care facility 08/09/2014   Anemia, unspecified 07/24/2013   Nonspecific abnormal electrocardiogram (ECG) (EKG) 07/23/2013   Psoriasis 07/14/2012   Menopausal state 07/14/2012   Encounter for long-term (current) use of other medications 06/17/2011   GOITER, MULTINODULAR 03/06/2009   LEUKOPENIA, CHRONIC 03/06/2009   Asymptomatic menopausal state 03/06/2009   TRIGGER FINGER, RIGHT THUMB 02/25/2008   HSV 08/22/2007   Diabetes (HCC) 08/22/2007   HYPERLIPIDEMIA 08/22/2007   DEPRESSION 08/22/2007   ALLERGIC RHINITIS 08/22/2007   ANGIOEDEMA 08/22/2007   Past  Medical History:  Diagnosis Date   Allergy-induced asthma    daily inhaler   Anxiety    Arthritis    hands   Dental crown present    Depression    GERD (gastroesophageal reflux disease)    High cholesterol    Hypertension    states under control with med., has been on med. x 10 yr.   Non-insulin dependent type 2 diabetes mellitus (HCC)    PONV (postoperative nausea and vomiting)    patient thinks related to Morphine   Psoriasis    arms, bilateral leg, back, breast, neck, ears   Stenosing tenosynovitis of finger of right hand 03/2015   right ring finger   Past Surgical History:  Procedure Laterality Date   BREAST REDUCTION SURGERY  2000   CYSTOSCOPY N/A 03/23/2018   Procedure: CYSTOSCOPY;  Surgeon: Darcel Pool, MD;  Location: [facility];  Service: Gynecology;  Laterality: N/A;   LEEP  03/18/2003   MYOMECTOMY  1995   POLYPECTOMY  01/2015   uterine   REDUCTION MAMMAPLASTY     2000   SUPRACERVICAL ABDOMINAL HYSTERECTOMY Bilateral 03/23/2018   Procedure: HYSTERECTOMY SUPRACERVICAL ABDOMINAL;  Surgeon: Darcel Pool, MD;  Location: [facility];  Service: Gynecology;  Laterality: Bilateral;   TRIGGER FINGER RELEASE Left 06/15/2009   thumb   TRIGGER FINGER RELEASE Right 11/15/2008   thumb   TRIGGER FINGER RELEASE Right 04/13/2015   Procedure: RELEASE A-1 PULLEY RIGHT RING FINGER;  Surgeon: Arley Curia, MD;  Location: [facility];  Service: Orthopedics;  Laterality: Right;   WISDOM TOOTH EXTRACTION  1972   Social History   Tobacco Use   Smoking status: Never   Smokeless tobacco: Never  Vaping Use   Vaping status: Never Used  Substance Use Topics   Alcohol use: Yes    Comment: occasionally   Drug use: No   Family History  Problem Relation Age of Onset   Diabetes Mother    Diabetes Father    Stroke Father    Hypertension Father    Glaucoma Father    Colon cancer Neg Hx    Esophageal cancer Neg Hx    Rectal cancer Neg Hx    Stomach cancer Neg Hx    Allergies   Allergen Reactions   Ace Inhibitors Other (See Comments)    ANGIOEDEMA    Morphine Itching, Nausea And Vomiting and Other (See Comments)   Other Swelling and Other (See Comments)   Ramipril Other (See Comments)    ANGIOEDEMA   Methimazole Other (See Comments)    Unknown reaction   Trichophyton Cough, Itching and Other (See Comments)    Throat itches   Latex Rash and Other (See Comments)    Used latex gloves with powder, rash occured   Merbromin Rash and Other (See Comments)    merbromin    ROS Negative unless stated above    Objective:     BP 122/84 (BP Location: Left Arm, Patient Position: Sitting, Cuff Size: Normal)   Pulse 73   Temp 98.4 F (36.9 C) (Oral)   Ht 5' 3.5 (1.613 m)   Wt 182 lb 6.4 oz (82.7 kg)   SpO2 94%   BMI 31.80 kg/m  BP Readings from Last 3 Encounters:  05/27/24 122/84  02/19/24 128/72  01/01/24 132/74   Wt Readings from Last 3 Encounters:  05/27/24 182 lb 6.4 oz (82.7 kg)  02/19/24 183 lb 12.8 oz (83.4 kg)  01/01/24 187 lb (84.8 kg)      Physical Exam Constitutional:      General: She is not in acute distress.    Appearance: Normal appearance.  HENT:     Head: Normocephalic and atraumatic.     Nose: Nose normal.     Mouth/Throat:     Mouth: Mucous membranes are moist.  Cardiovascular:     Rate and Rhythm: Normal rate and regular rhythm.     Heart sounds: Normal heart sounds. No murmur heard.    No gallop.  Pulmonary:     Effort: Pulmonary effort is normal. No respiratory distress.     Breath sounds: Normal breath sounds. No wheezing, rhonchi or rales.  Skin:    General: Skin is warm and dry.     Findings: Abscess present.     Comments: Right axilla with a fluctuant 8 mm abscess, nontender without erythema or drainage.  CGM in place on posterior left arm.  Neurological:     Mental Status: She is alert and oriented to person, place, and time.        02/19/2024    9:28 AM 01/01/2024    3:26 PM 12/03/2023    4:30 PM   Depression screen PHQ 2/9  Decreased Interest 0 0 0  Down, Depressed, Hopeless 0 1 0  PHQ - 2 Score 0 1 0  Altered sleeping 0 0   Tired, decreased energy 1 0   Change in appetite 0 0   Feeling bad or failure about yourself  0  0   Trouble concentrating 0 0   Moving slowly or fidgety/restless 0 0   Suicidal thoughts 0 0   PHQ-9 Score 1 1   Difficult doing work/chores Not difficult at all Not difficult at all       02/19/2024    9:28 AM 01/01/2024    3:26 PM 08/16/2020    4:29 PM 01/22/2019    4:06 PM  GAD 7 : Generalized Anxiety Score  Nervous, Anxious, on Edge 0 0 1 1  Control/stop worrying 0 0 1 0  Worry too much - different things 0 0 1 0  Trouble relaxing 0 0 1 1  Restless 0 0 0 0  Easily annoyed or irritable 0 0 0 0  Afraid - awful might happen 0 0 1 0  Total GAD 7 Score 0 0 5 2  Anxiety Difficulty    Not difficult at all     No results found for any visits on 05/27/24.    Assessment & Plan:   Type 2 diabetes mellitus with other specified complication, without long-term current use of insulin (HCC) -     Hemoglobin A1c; Future  Vitamin D  deficiency -     VITAMIN D  25 Hydroxy (Vit-D Deficiency, Fractures); Future  Vitamin B12 deficiency -     Vitamin B12; Future  Abnormal laboratory test -     T4, free; Future -     TSH; Future  Abscess  DM controlled.  Obtain A1c this visit.  Continue metformin  1000 mg in a.m. and Farxiga  5 mg in a.m.  Patient encouraged to have a small snack/check blood sugar prior to bed to avoid hypoglycemia overnight.  Can also consider having dinner a little later.  Recheck vitamin D  and vitamin B12 status post repletion.  Recheck thyroid  as previously advised levels were abnormal at OB/GYN.  Abscess in right axilla improving.  Continue warm compresses.  Discussed treatment options including I&D for continued or worsening symptoms.   Return in about 4 months (around 09/27/2024).  Sooner if needed.  Clotilda JONELLE Single, MD

## 2024-05-28 ENCOUNTER — Ambulatory Visit: Payer: Self-pay | Admitting: Family Medicine

## 2024-05-31 ENCOUNTER — Telehealth: Payer: Self-pay | Admitting: Family Medicine

## 2024-05-31 DIAGNOSIS — J301 Allergic rhinitis due to pollen: Secondary | ICD-10-CM | POA: Diagnosis not present

## 2024-05-31 DIAGNOSIS — H1045 Other chronic allergic conjunctivitis: Secondary | ICD-10-CM | POA: Diagnosis not present

## 2024-05-31 DIAGNOSIS — J3081 Allergic rhinitis due to animal (cat) (dog) hair and dander: Secondary | ICD-10-CM | POA: Diagnosis not present

## 2024-05-31 DIAGNOSIS — J3089 Other allergic rhinitis: Secondary | ICD-10-CM | POA: Diagnosis not present

## 2024-05-31 DIAGNOSIS — R21 Rash and other nonspecific skin eruption: Secondary | ICD-10-CM | POA: Diagnosis not present

## 2024-05-31 DIAGNOSIS — J4530 Mild persistent asthma, uncomplicated: Secondary | ICD-10-CM | POA: Diagnosis not present

## 2024-05-31 NOTE — Telephone Encounter (Unsigned)
 Copied from CRM [id number]. Topic: General - Other >> May 31, 2024 12:36 PM Gennette ORN wrote: Reason for CRM: Patient is calling in about medication refill advised the patient it takes up to  3 business day for it to be filled and to follow up with pharmacy.

## 2024-06-02 ENCOUNTER — Other Ambulatory Visit: Payer: Self-pay | Admitting: Family Medicine

## 2024-06-02 DIAGNOSIS — F339 Major depressive disorder, recurrent, unspecified: Secondary | ICD-10-CM

## 2024-06-02 DIAGNOSIS — I10 Essential (primary) hypertension: Secondary | ICD-10-CM

## 2024-06-02 NOTE — Telephone Encounter (Signed)
 Copied from CRM [id number]. Topic: Clinical - Medication Refill >> Jun 02, 2024 10:00 AM Mercedes MATSU wrote: Medication:  FLUoxetine  (PROZAC ) 20 MG capsule triamterene -hydrochlorothiazide  (MAXZIDE -25) 37.5-25 MG tablet  Has the patient contacted their pharmacy? Yes (Agent: If no, request that the patient contact the pharmacy for the refill. If patient does not wish to contact the pharmacy document the reason why and proceed with request.) (Agent: If yes, when and what did the pharmacy advise?)  This is the patient's preferred pharmacy:  [facility] [address] [place] - [address] [name] [address] Phone: [phone] Fax: [phone]  Is this the correct pharmacy for this prescription? Yes If no, delete pharmacy and type the correct one.   Has the prescription been filled recently? Yes  Is the patient out of the medication? Yes  Has the patient been seen for an appointment in the last year OR does the patient have an upcoming appointment? Yes  Can we respond through MyChart? Yes  Agent: Please be advised that Rx refills may take up to 3 business days. We ask that you follow-up with your pharmacy.

## 2024-06-03 ENCOUNTER — Other Ambulatory Visit: Payer: Self-pay | Admitting: Family Medicine

## 2024-06-03 ENCOUNTER — Telehealth: Payer: Self-pay | Admitting: *Deleted

## 2024-06-03 DIAGNOSIS — F339 Major depressive disorder, recurrent, unspecified: Secondary | ICD-10-CM

## 2024-06-03 NOTE — Telephone Encounter (Signed)
 Copied from CRM [id number]. Topic: Clinical - Prescription Issue >> Jun 03, 2024 11:59 AM Arm[place] J wrote: Reason for CRM: [employer] is calling to let us  know that they did not receive the patient's FLUoxetine  (PROZAC ) 20 MG capsule and triamterene -hydrochlorothiazide  (MAXZIDE -25) 37.5-25 MG tablet. They are asking if it can be resent today.

## 2024-06-03 NOTE — Telephone Encounter (Signed)
 Spoke with [employer], gave a verbal for medication that was sent yesterday

## 2024-06-10 DIAGNOSIS — J3081 Allergic rhinitis due to animal (cat) (dog) hair and dander: Secondary | ICD-10-CM | POA: Diagnosis not present

## 2024-06-10 DIAGNOSIS — J301 Allergic rhinitis due to pollen: Secondary | ICD-10-CM | POA: Diagnosis not present

## 2024-06-10 DIAGNOSIS — J3089 Other allergic rhinitis: Secondary | ICD-10-CM | POA: Diagnosis not present

## 2024-06-16 DIAGNOSIS — J3089 Other allergic rhinitis: Secondary | ICD-10-CM | POA: Diagnosis not present

## 2024-06-16 DIAGNOSIS — J3081 Allergic rhinitis due to animal (cat) (dog) hair and dander: Secondary | ICD-10-CM | POA: Diagnosis not present

## 2024-06-16 DIAGNOSIS — J301 Allergic rhinitis due to pollen: Secondary | ICD-10-CM | POA: Diagnosis not present

## 2024-06-24 ENCOUNTER — Encounter (INDEPENDENT_AMBULATORY_CARE_PROVIDER_SITE_OTHER): Admitting: Family Medicine

## 2024-06-24 ENCOUNTER — Encounter

## 2024-06-24 NOTE — Progress Notes (Signed)
 error

## 2024-06-25 ENCOUNTER — Ambulatory Visit (INDEPENDENT_AMBULATORY_CARE_PROVIDER_SITE_OTHER)

## 2024-06-25 VITALS — Ht 63.5 in | Wt 182.0 lb

## 2024-06-25 DIAGNOSIS — Z0000 Encounter for general adult medical examination without abnormal findings: Secondary | ICD-10-CM | POA: Diagnosis not present

## 2024-06-25 NOTE — Progress Notes (Signed)
 Subjective:   Brittany Taylor is a 68 y.o. who presents for a Medicare Wellness preventive visit.  As a reminder, Annual Wellness Visits don't include a physical exam, and some assessments may be limited, especially if this visit is performed virtually. We may recommend an in-person follow-up visit with your provider if needed.  Visit Complete: Virtual I connected with  Svetlana Bagby Hewett on 06/25/24 by a audio enabled telemedicine application and verified that I am speaking with the correct person using two identifiers.  Patient Location: Home  Provider Location: Home Office  I discussed the limitations of evaluation and management by telemedicine. The patient expressed understanding and agreed to proceed.  Vital Signs: Because this visit was a virtual/telehealth visit, some criteria may be missing or patient reported. Any vitals not documented were not able to be obtained and vitals that have been documented are patient reported.    Persons Participating in Visit: Patient.  AWV Questionnaire: No: Patient Medicare AWV questionnaire was not completed prior to this visit.  Cardiac Risk Factors include: advanced age (>18men, >72 women);diabetes mellitus     Objective:    Today's Vitals   06/25/24 1132  Weight: 182 lb (82.6 kg)  Height: 5' 3.5 (1.613 m)   Body mass index is 31.73 kg/m.     06/25/2024   11:40 AM 03/24/2018    4:00 AM 03/11/2018    3:29 PM 04/13/2015    7:30 AM 04/10/2015   12:08 PM  Advanced Directives  Does Patient Have a Medical Advance Directive? No No  No  No  No   Would patient like information on creating a medical advance directive? No - Patient declined Yes (MAU/Ambulatory/Procedural Areas - Information given)  Yes (MAU/Ambulatory/Procedural Areas - Information given)   Yes - Educational materials given      Data saved with a previous flowsheet row definition    Current Medications (verified) Outpatient Encounter Medications as of 06/25/2024  Medication  Sig   albuterol  (VENTOLIN  HFA) 108 (90 Base) MCG/ACT inhaler Inhale 2 puffs into the lungs every 6 (six) hours as needed for wheezing or shortness of breath.   aspirin 81 MG tablet Take 162 mg by mouth daily.    azelastine (OPTIVAR) 0.05 % ophthalmic solution Place 1 drop into both eyes 2 (two) times daily as needed (for itchy/allergy eyes).    blood glucose meter kit and supplies KIT Dispense based on patient and insurance preference. Use up to four times daily as directed.   Continuous Glucose Sensor (FREESTYLE LIBRE 3 PLUS SENSOR) MISC Change sensor every 15 days.   Continuous Glucose Sensor (FREESTYLE LIBRE 3 SENSOR) MISC place 1 sensor on the skin every 14 days use to check glucose continuously   dapagliflozin  propanediol (FARXIGA ) 5 MG TABS tablet Take 1 tablet (5 mg total) by mouth daily before breakfast.   docusate sodium  (COLACE) 100 MG capsule Take 1 capsule (100 mg total) by mouth 2 (two) times daily.   EPINEPHrine 0.3 mg/0.3 mL IJ SOAJ injection Inject 0.3 mg into the muscle daily as needed for anaphylaxis.   Estradiol 10 MCG TABS vaginal tablet Place 10 mcg vaginally 2 (two) times a week.   FLUoxetine  (PROZAC ) 20 MG capsule TAKE TWO CAPSULES BY MOUTH DAILY   fluticasone  (FLONASE ) 50 MCG/ACT nasal spray Place 2 sprays into both nostrils daily.   fluticasone -salmeterol (ADVAIR) 250-50 MCG/ACT AEPB Inhale 1 puff into the lungs 2 (two) times daily.   glucose blood (ONETOUCH VERIO) test strip 1 each by Other  route daily. And lancets 1/day 250.00   hydrOXYzine  (ATARAX /VISTARIL ) 10 MG tablet Take 1 tablet (10 mg total) by mouth every 6 (six) hours as needed for itching.   ibuprofen  (ADVIL ,MOTRIN ) 600 MG tablet 1  po  pc every 6 hours with food, for 5 days then as needed for pain   loratadine  (CLARITIN ) 10 MG tablet Take 10 mg by mouth daily.   metFORMIN  (GLUCOPHAGE ) 1000 MG tablet Take 1 tablet (1,000 mg total) by mouth daily with breakfast.   montelukast  (SINGULAIR ) 10 MG tablet Take by  mouth.   Multiple Vitamin (MULTIVITAMIN ADULT PO)    NON FORMULARY Inject into the skin once a week. Allergy shots   Risankizumab-rzaa (SKYRIZI Day) Skyrizi   simethicone  (MYLICON) 80 MG chewable tablet Chew 1 tablet (80 mg total) by mouth 4 (four) times daily as needed for flatulence.   triamcinolone  ointment (KENALOG ) 0.1 % Apply 1 application topically 2 (two) times daily as needed (for psoriasis).   triamterene -hydrochlorothiazide  (MAXZIDE -25) 37.5-25 MG tablet TAKE ONE TABLET BY MOUTH ONCE A DAY   No facility-administered encounter medications on file as of 06/25/2024.    Allergies (verified) Ace inhibitors, Morphine, Other, Ramipril, Methimazole, Trichophyton, Latex, and Merbromin   History: Past Medical History:  Diagnosis Date   Allergy-induced asthma    daily inhaler   Anxiety    Arthritis    hands   Dental crown present    Depression    GERD (gastroesophageal reflux disease)    High cholesterol    Hypertension    states under control with med., has been on med. x 10 yr.   Non-insulin dependent type 2 diabetes mellitus (HCC)    PONV (postoperative nausea and vomiting)    patient thinks related to Morphine   Psoriasis    arms, bilateral leg, back, breast, neck, ears   Stenosing tenosynovitis of finger of right hand 03/2015   right ring finger   Past Surgical History:  Procedure Laterality Date   BREAST REDUCTION SURGERY  2000   CYSTOSCOPY N/A 03/23/2018   Procedure: CYSTOSCOPY;  Surgeon: Darcel Pool, MD;  Location: [facility];  Service: Gynecology;  Laterality: N/A;   LEEP  03/18/2003   MYOMECTOMY  1995   POLYPECTOMY  01/2015   uterine   REDUCTION MAMMAPLASTY     2000   SUPRACERVICAL ABDOMINAL HYSTERECTOMY Bilateral 03/23/2018   Procedure: HYSTERECTOMY SUPRACERVICAL ABDOMINAL;  Surgeon: Darcel Pool, MD;  Location: [facility];  Service: Gynecology;  Laterality: Bilateral;   TRIGGER FINGER RELEASE Left 06/15/2009   thumb   TRIGGER FINGER RELEASE Right 11/15/2008   thumb    TRIGGER FINGER RELEASE Right 04/13/2015   Procedure: RELEASE A-1 PULLEY RIGHT RING FINGER;  Surgeon: Arley Curia, MD;  Location: [facility];  Service: Orthopedics;  Laterality: Right;   WISDOM TOOTH EXTRACTION  1972   Family History  Problem Relation Age of Onset   Diabetes Mother    Diabetes Father    Stroke Father    Hypertension Father    Glaucoma Father    Colon cancer Neg Hx    Esophageal cancer Neg Hx    Rectal cancer Neg Hx    Stomach cancer Neg Hx    Social History   Socioeconomic History   Marital status: Single    Spouse name: Not on file   Number of children: 0   Years of education: Not on file   Highest education level: Master's degree (e.g., MA, MS, MEng, MEd, MSW, MBA)  Occupational History  Occupation: Teacher, adult education: [employer]  Tobacco Use   Smoking status: Never   Smokeless tobacco: Never  Vaping Use   Vaping status: Never Used  Substance and Sexual Activity   Alcohol use: Yes    Comment: occasionally   Drug use: No   Sexual activity: Yes  Other Topics Concern   Not on file  Social History Narrative   Not on file   Social Drivers of Health   Financial Resource Strain: Low Risk  (06/25/2024)   Overall Financial Resource Strain (CARDIA)    Difficulty of Paying Living Expenses: Not hard at all  Food Insecurity: No Food Insecurity (06/25/2024)   Hunger Vital Sign    Worried About Running Out of Food in the Last Year: Never true    Ran Out of Food in the Last Year: Never true  Transportation Needs: No Transportation Needs (06/25/2024)   PRAPARE - Administrator, Civil Service (Medical): No    Lack of Transportation (Non-Medical): No  Physical Activity: Insufficiently Active (06/25/2024)   Exercise Vital Sign    Days of Exercise per Week: 3 days    Minutes of Exercise per Session: 40 min  Stress: No Stress Concern Present (06/25/2024)   [employer] of Occupational Health - Occupational Stress Questionnaire     Feeling of Stress: Not at all  Recent Concern: Stress - Stress Concern Present (06/24/2024)   [employer] of Occupational Health - Occupational Stress Questionnaire    Feeling of Stress: To some extent  Social Connections: Moderately Integrated (06/25/2024)   Social Connection and Isolation Panel    Frequency of Communication with Friends and Family: More than three times a week    Frequency of Social Gatherings with Friends and Family: More than three times a week    Attends Religious Services: More than 4 times per year    Active Member of [employer] or Organizations: Yes    Attends Engineer, structural: More than 4 times per year    Marital Status: Never married    Tobacco Counseling Counseling given: Not Answered    Clinical Intake:  Pre-visit preparation completed: Yes  Pain : No/denies pain     BMI - recorded: 31.73 Nutritional Status: BMI > 30  Obese Nutritional Risks: None Diabetes: Yes CBG done?: Yes (CBG 108 Per patient) CBG resulted in Enter/ Edit results?: Yes Did pt. bring in CBG monitor from home?: No  Lab Results  Component Value Date   HGBA1C 6.9 (H) 05/27/2024   HGBA1C 6.5 (A) 01/23/2023   HGBA1C 8.5 (A) 10/23/2022     How often do you need to have someone help you when you read instructions, pamphlets, or other written materials from your doctor or pharmacy?: 1 - Never  Interpreter Needed?: No  Information entered by :: Rojelio Blush LPN   Activities of Daily Living     06/25/2024   11:39 AM  In your present state of health, do you have any difficulty performing the following activities:  Hearing? 0  Vision? 0  Difficulty concentrating or making decisions? 0  Walking or climbing stairs? 0  Dressing or bathing? 0  Doing errands, shopping? 0  Preparing Food and eating ? N  Using the Toilet? N  In the past six months, have you accidently leaked urine? N  Do you have problems with loss of bowel control? N  Managing your Medications? N   Managing your Finances? N  Housekeeping or managing your Housekeeping?  N    Patient Care Team: Mercer Clotilda SAUNDERS, MD as PCP - General (Family Medicine) Frutoso Luz, MD (Allergy) Armond Cape, MD as Consulting Physician (Obstetrics and Gynecology) Frutoso Luz, MD (Allergy) Ivin Kocher, MD as Consulting Physician (Dermatology) Frutoso Duos, MD (Ophthalmology)  I have updated your Care Teams any recent Medical Services you may have received from other providers in the past year.     Assessment:   This is a routine wellness examination for Pierra.  Hearing/Vision screen Hearing Screening - Comments:: Denies hearing difficulties   Vision Screening - Comments:: Wears rx glasses - up to date with [facility] exams with  [facility]   Goals Addressed               This Visit's Progress     Increase physical activity (pt-stated)        Remain active       Depression Screen     06/25/2024   11:38 AM 05/27/2024   10:41 AM 02/19/2024    9:28 AM 01/01/2024    3:26 PM 12/03/2023    4:30 PM 12/02/2022   11:31 AM 10/23/2022    4:05 PM  PHQ 2/9 Scores  PHQ - 2 Score 0 0 0 1 0 1 1  PHQ- 9 Score 0 0 1 1  2 3     Fall Risk     06/25/2024   11:39 AM 05/27/2024   10:40 AM 02/19/2024    9:45 AM 01/01/2024    3:14 PM 12/03/2023    4:30 PM  Fall Risk   Falls in the past year? 0 0 0 0 0  Number falls in past yr: 0 0 0 0   Injury with Fall? 0 0 0 0   Risk for fall due to : No Fall Risks No Fall Risks No Fall Risks No Fall Risks   Follow up Falls evaluation completed  Falls evaluation completed Falls evaluation completed     MEDICARE RISK AT HOME:  Medicare Risk at Home Any stairs in or around the home?: No If so, are there any without handrails?: No Home free of loose throw rugs in walkways, pet beds, electrical cords, etc?: Yes Adequate lighting in your home to reduce risk of falls?: Yes Life alert?: No Use of a cane, walker or w/c?: No Grab bars in the bathroom?:  No Shower chair or bench in shower?: No Elevated toilet seat or a handicapped toilet?: No  TIMED UP AND GO:  Was the test performed?  No  Cognitive Function: 6CIT completed        06/25/2024   11:40 AM  6CIT Screen  What Year? 0 points  What month? 0 points  What time? 0 points  Count back from 20 0 points  Months in reverse 0 points  Repeat phrase 0 points  Total Score 0 points    Immunizations Immunization History  Administered Date(s) Administered   Dtap, Unspecified 06/18/1958   Hep B, Unspecified 11/18/1992   Hepatitis B 11/18/1992   Influenza Whole 09/18/2010   Influenza, High Dose Seasonal PF 02/04/2017, 01/26/2018   Influenza, Seasonal, Injecte, Preservative Fre 09/07/2010, 09/13/2011   Influenza,inj,Quad PF,6+ Mos 08/09/2014, 08/10/2015, 08/09/2016, 08/08/2017, 08/25/2018   Influenza,inj,quad, With Preservative 08/03/2020   Influenza,trivalent, recombinat, inj, PF 08/08/2023   Influenza-Unspecified 08/09/2017, 08/03/2020, 08/20/2021, 11/18/2021, 08/12/2022, 08/08/2023   PFIZER Comirnaty(Gray Top)Covid-19 Tri-Sucrose Vaccine 06/04/2021, 01/07/2022   PFIZER(Purple Top)SARS-COV-2 Vaccination 12/24/2019, 01/14/2020, 04/24/2020, 08/14/2020, 01/07/2022   PNEUMOCOCCAL CONJUGATE-20 09/29/2023   PPD Test  05/19/2017   Pfizer(Comirnaty)Fall Seasonal Vaccine 12 years and older 08/27/2022, 08/08/2023   Pneumococcal Polysaccharide-23 07/14/2012, 02/04/2017, 01/26/2018   Pneumococcal-Unspecified 08/10/2015   Polio, Unspecified 12/12/1961   Td 03/06/2009   Td (Adult),unspecified 08/07/1970   Tdap 03/06/2009, 04/02/2019   Unspecified SARS-COV-2 Vaccination 03/09/2020, 08/27/2022, 08/08/2023   Zoster Recombinant(Shingrix ) 09/29/2023, 12/19/2023, 03/01/2024   Zoster, Live 07/20/2015, 08/10/2015    Screening Tests Health Maintenance  Topic Date Due   Hepatitis B Vaccines (2 of 3 - 19+ 3-dose series) 12/16/1992   COVID-19 Vaccine (10 - Mixed Product risk 2024-25 season)  02/05/2024   INFLUENZA VACCINE  06/18/2024   FOOT EXAM  09/28/2024   OPHTHALMOLOGY EXAM  10/27/2024   HEMOGLOBIN A1C  11/27/2024   Diabetic kidney evaluation - eGFR measurement  02/18/2025   Diabetic kidney evaluation - Urine ACR  02/18/2025   Medicare Annual Wellness (AWV)  06/25/2025   MAMMOGRAM  07/06/2025   DTaP/Tdap/Td (6 - Td or Tdap) 04/01/2029   Colonoscopy  04/12/2031   Pneumococcal Vaccine: 50+ Years  Completed   DEXA SCAN  Completed   Hepatitis C Screening  Completed   Zoster Vaccines- Shingrix   Completed   HPV VACCINES  Aged Out   Meningococcal B Vaccine  Aged Out    Health Maintenance  Health Maintenance Due  Topic Date Due   Hepatitis B Vaccines (2 of 3 - 19+ 3-dose series) 12/16/1992   COVID-19 Vaccine (10 - Mixed Product risk 2024-25 season) 02/05/2024   INFLUENZA VACCINE  06/18/2024   Health Maintenance Items Addressed:   Additional Screening:  Vision Screening: Recommended annual ophthalmology exams for early detection of glaucoma and other disorders of the eye. Would you like a referral to an eye doctor? No    Dental Screening: Recommended annual dental exams for proper oral hygiene  Community Resource Referral / Chronic Care Management: CRR required this visit?  No   CCM required this visit?  No   Plan:    I have personally reviewed and noted the following in the patient's chart:   Medical and social history Use of alcohol, tobacco or illicit drugs  Current medications and supplements including opioid prescriptions. Patient is not currently taking opioid prescriptions. Functional ability and status Nutritional status Physical activity Advanced directives List of other physicians Hospitalizations, surgeries, and ER visits in previous 12 months Vitals Screenings to include cognitive, depression, and falls Referrals and appointments  In addition, I have reviewed and discussed with patient certain preventive protocols, quality metrics, and  best practice recommendations. A written personalized care plan for preventive services as well as general preventive health recommendations were provided to patient.   Rojelio LELON Blush, LPN   11/18/7972   After Visit Summary: (MyChart) Due to this being a telephonic visit, the after visit summary with patients personalized plan was offered to patient via MyChart   Notes: Nothing significant to report at this time.

## 2024-06-25 NOTE — Patient Instructions (Addendum)
 Ms. Brittany Taylor , Thank you for taking time out of your busy schedule to complete your Annual Wellness Visit with me. I enjoyed our conversation and look forward to speaking with you again next year. I, as well as your care team,  appreciate your ongoing commitment to your health goals. Please review the following plan we discussed and let me know if I can assist you in the future. Your Game plan/ To Do List    Referrals: If you haven't heard from the office you've been referred to, please reach out to them at the phone provided.   Follow up Visits: We will see or speak with you next year for your Next Medicare AWV with our clinical staff 07/01/25 @ 11:20a Have you seen your provider in the last 6 months (3 months if uncontrolled diabetes)? Appointment 09/29/24 @ 9a  Clinician Recommendations:  Aim for 30 minutes of exercise or brisk walking, 6-8 glasses of water, and 5 servings of fruits and vegetables each day.       This is a list of the screenings recommended for you:  Health Maintenance  Topic Date Due   Hepatitis B Vaccine (2 of 3 - 19+ 3-dose series) 12/16/1992   COVID-19 Vaccine (10 - Mixed Product risk 2024-25 season) 02/05/2024   Flu Shot  06/18/2024   Complete foot exam   09/28/2024   Eye exam for diabetics  10/27/2024   Hemoglobin A1C  11/27/2024   Yearly kidney function blood test for diabetes  02/18/2025   Yearly kidney health urinalysis for diabetes  02/18/2025   Medicare Annual Wellness Visit  06/25/2025   Mammogram  07/06/2025   DTaP/Tdap/Td vaccine (6 - Td or Tdap) 04/01/2029   Colon Cancer Screening  04/12/2031   Pneumococcal Vaccine for age over 43  Completed   DEXA scan (bone density measurement)  Completed   Hepatitis C Screening  Completed   Zoster (Shingles) Vaccine  Completed   HPV Vaccine  Aged Out   Meningitis B Vaccine  Aged Out    Advanced directives: (Declined) Advance directive discussed with you today. Even though you declined this today, please call  our office should you change your mind, and we can give you the proper paperwork for you to fill out. Advance Care Planning is important because it:  [x]  Makes sure you receive the medical care that is consistent with your values, goals, and preferences  [x]  It provides guidance to your family and loved ones and reduces their decisional burden about whether or not they are making the right decisions based on your wishes.  Follow the link provided in your after visit summary or read over the paperwork we have mailed to you to help you started getting your Advance Directives in place. If you need assistance in completing these, please reach out to us  so that we can help you!  See attachments for Preventive Care and Fall Prevention Tips.

## 2024-06-30 DIAGNOSIS — J3089 Other allergic rhinitis: Secondary | ICD-10-CM | POA: Diagnosis not present

## 2024-06-30 DIAGNOSIS — J301 Allergic rhinitis due to pollen: Secondary | ICD-10-CM | POA: Diagnosis not present

## 2024-06-30 DIAGNOSIS — J3081 Allergic rhinitis due to animal (cat) (dog) hair and dander: Secondary | ICD-10-CM | POA: Diagnosis not present

## 2024-07-07 ENCOUNTER — Ambulatory Visit
Admission: RE | Admit: 2024-07-07 | Discharge: 2024-07-07 | Disposition: A | Discharge status: Home / Self Care | Source: Ambulatory Visit | Attending: Obstetrics and Gynecology | Admitting: Obstetrics and Gynecology

## 2024-07-07 DIAGNOSIS — J3081 Allergic rhinitis due to animal (cat) (dog) hair and dander: Secondary | ICD-10-CM | POA: Diagnosis not present

## 2024-07-07 DIAGNOSIS — R87810 Cervical high risk human papillomavirus (HPV) DNA test positive: Secondary | ICD-10-CM | POA: Diagnosis not present

## 2024-07-07 DIAGNOSIS — Z01419 Encounter for gynecological examination (general) (routine) without abnormal findings: Secondary | ICD-10-CM | POA: Diagnosis not present

## 2024-07-07 DIAGNOSIS — J3089 Other allergic rhinitis: Secondary | ICD-10-CM | POA: Diagnosis not present

## 2024-07-07 DIAGNOSIS — Z1231 Encounter for screening mammogram for malignant neoplasm of breast: Secondary | ICD-10-CM | POA: Diagnosis not present

## 2024-07-07 DIAGNOSIS — Z13820 Encounter for screening for osteoporosis: Secondary | ICD-10-CM | POA: Diagnosis not present

## 2024-07-07 DIAGNOSIS — Z1339 Encounter for screening examination for other mental health and behavioral disorders: Secondary | ICD-10-CM | POA: Diagnosis not present

## 2024-07-07 DIAGNOSIS — Z1211 Encounter for screening for malignant neoplasm of colon: Secondary | ICD-10-CM | POA: Diagnosis not present

## 2024-07-07 DIAGNOSIS — J301 Allergic rhinitis due to pollen: Secondary | ICD-10-CM | POA: Diagnosis not present

## 2024-07-07 DIAGNOSIS — L309 Dermatitis, unspecified: Secondary | ICD-10-CM | POA: Diagnosis not present

## 2024-07-07 DIAGNOSIS — N952 Postmenopausal atrophic vaginitis: Secondary | ICD-10-CM | POA: Diagnosis not present

## 2024-07-07 DIAGNOSIS — Z87440 Personal history of urinary (tract) infections: Secondary | ICD-10-CM | POA: Diagnosis not present

## 2024-07-15 DIAGNOSIS — J3089 Other allergic rhinitis: Secondary | ICD-10-CM | POA: Diagnosis not present

## 2024-07-15 DIAGNOSIS — J301 Allergic rhinitis due to pollen: Secondary | ICD-10-CM | POA: Diagnosis not present

## 2024-07-15 DIAGNOSIS — J3081 Allergic rhinitis due to animal (cat) (dog) hair and dander: Secondary | ICD-10-CM | POA: Diagnosis not present

## 2024-07-21 DIAGNOSIS — J301 Allergic rhinitis due to pollen: Secondary | ICD-10-CM | POA: Diagnosis not present

## 2024-07-21 DIAGNOSIS — J3089 Other allergic rhinitis: Secondary | ICD-10-CM | POA: Diagnosis not present

## 2024-07-21 DIAGNOSIS — J3081 Allergic rhinitis due to animal (cat) (dog) hair and dander: Secondary | ICD-10-CM | POA: Diagnosis not present

## 2024-07-28 DIAGNOSIS — R87810 Cervical high risk human papillomavirus (HPV) DNA test positive: Secondary | ICD-10-CM | POA: Diagnosis not present

## 2024-07-28 DIAGNOSIS — J3089 Other allergic rhinitis: Secondary | ICD-10-CM | POA: Diagnosis not present

## 2024-07-28 DIAGNOSIS — J3081 Allergic rhinitis due to animal (cat) (dog) hair and dander: Secondary | ICD-10-CM | POA: Diagnosis not present

## 2024-07-28 DIAGNOSIS — J301 Allergic rhinitis due to pollen: Secondary | ICD-10-CM | POA: Diagnosis not present

## 2024-08-11 DIAGNOSIS — J3089 Other allergic rhinitis: Secondary | ICD-10-CM | POA: Diagnosis not present

## 2024-08-11 DIAGNOSIS — J301 Allergic rhinitis due to pollen: Secondary | ICD-10-CM | POA: Diagnosis not present

## 2024-08-11 DIAGNOSIS — J3081 Allergic rhinitis due to animal (cat) (dog) hair and dander: Secondary | ICD-10-CM | POA: Diagnosis not present

## 2024-08-18 DIAGNOSIS — J301 Allergic rhinitis due to pollen: Secondary | ICD-10-CM | POA: Diagnosis not present

## 2024-08-18 DIAGNOSIS — J3089 Other allergic rhinitis: Secondary | ICD-10-CM | POA: Diagnosis not present

## 2024-08-18 DIAGNOSIS — J3081 Allergic rhinitis due to animal (cat) (dog) hair and dander: Secondary | ICD-10-CM | POA: Diagnosis not present

## 2024-08-26 DIAGNOSIS — J3081 Allergic rhinitis due to animal (cat) (dog) hair and dander: Secondary | ICD-10-CM | POA: Diagnosis not present

## 2024-08-26 DIAGNOSIS — J3089 Other allergic rhinitis: Secondary | ICD-10-CM | POA: Diagnosis not present

## 2024-08-26 DIAGNOSIS — J301 Allergic rhinitis due to pollen: Secondary | ICD-10-CM | POA: Diagnosis not present

## 2024-09-02 DIAGNOSIS — J301 Allergic rhinitis due to pollen: Secondary | ICD-10-CM | POA: Diagnosis not present

## 2024-09-02 DIAGNOSIS — J3081 Allergic rhinitis due to animal (cat) (dog) hair and dander: Secondary | ICD-10-CM | POA: Diagnosis not present

## 2024-09-02 DIAGNOSIS — Z13220 Encounter for screening for lipoid disorders: Secondary | ICD-10-CM | POA: Diagnosis not present

## 2024-09-02 DIAGNOSIS — J3089 Other allergic rhinitis: Secondary | ICD-10-CM | POA: Diagnosis not present

## 2024-09-02 LAB — HEMOGLOBIN A1C: Hemoglobin A1C: 7.1

## 2024-09-02 LAB — LIPID PANEL
Cholesterol: 254 — AB (ref 0–200)
HDL: 75 — AB (ref 35–70)
LDL Cholesterol: 159
Triglycerides: 96 (ref 40–160)

## 2024-09-08 DIAGNOSIS — J3081 Allergic rhinitis due to animal (cat) (dog) hair and dander: Secondary | ICD-10-CM | POA: Diagnosis not present

## 2024-09-08 DIAGNOSIS — J3089 Other allergic rhinitis: Secondary | ICD-10-CM | POA: Diagnosis not present

## 2024-09-08 DIAGNOSIS — J301 Allergic rhinitis due to pollen: Secondary | ICD-10-CM | POA: Diagnosis not present

## 2024-09-18 ENCOUNTER — Other Ambulatory Visit: Payer: Self-pay | Admitting: Family Medicine

## 2024-09-21 DIAGNOSIS — J301 Allergic rhinitis due to pollen: Secondary | ICD-10-CM | POA: Diagnosis not present

## 2024-09-21 DIAGNOSIS — J3089 Other allergic rhinitis: Secondary | ICD-10-CM | POA: Diagnosis not present

## 2024-09-21 DIAGNOSIS — J3081 Allergic rhinitis due to animal (cat) (dog) hair and dander: Secondary | ICD-10-CM | POA: Diagnosis not present

## 2024-09-29 ENCOUNTER — Ambulatory Visit: Admitting: Family Medicine

## 2024-09-29 ENCOUNTER — Encounter: Payer: Self-pay | Admitting: Family Medicine

## 2024-09-29 VITALS — BP 118/76 | HR 82 | Temp 98.7°F | Ht 60.35 in | Wt 187.2 lb

## 2024-09-29 DIAGNOSIS — J3081 Allergic rhinitis due to animal (cat) (dog) hair and dander: Secondary | ICD-10-CM | POA: Diagnosis not present

## 2024-09-29 DIAGNOSIS — J3089 Other allergic rhinitis: Secondary | ICD-10-CM | POA: Diagnosis not present

## 2024-09-29 DIAGNOSIS — M25461 Effusion, right knee: Secondary | ICD-10-CM | POA: Diagnosis not present

## 2024-09-29 DIAGNOSIS — Z7984 Long term (current) use of oral hypoglycemic drugs: Secondary | ICD-10-CM

## 2024-09-29 DIAGNOSIS — I10 Essential (primary) hypertension: Secondary | ICD-10-CM

## 2024-09-29 DIAGNOSIS — E538 Deficiency of other specified B group vitamins: Secondary | ICD-10-CM

## 2024-09-29 DIAGNOSIS — F339 Major depressive disorder, recurrent, unspecified: Secondary | ICD-10-CM

## 2024-09-29 DIAGNOSIS — E782 Mixed hyperlipidemia: Secondary | ICD-10-CM | POA: Diagnosis not present

## 2024-09-29 DIAGNOSIS — J301 Allergic rhinitis due to pollen: Secondary | ICD-10-CM | POA: Diagnosis not present

## 2024-09-29 DIAGNOSIS — E1169 Type 2 diabetes mellitus with other specified complication: Secondary | ICD-10-CM | POA: Diagnosis not present

## 2024-09-29 DIAGNOSIS — E559 Vitamin D deficiency, unspecified: Secondary | ICD-10-CM

## 2024-09-29 MED ORDER — ROSUVASTATIN CALCIUM 5 MG PO TABS
5.0000 mg | ORAL_TABLET | Freq: Every day | ORAL | 3 refills | Status: AC
Start: 2024-09-29 — End: ?

## 2024-09-29 NOTE — Progress Notes (Signed)
 Established Patient Office Visit   Subjective  Patient ID: Brittany Taylor, female    DOB: 1956/06/17  Age: 68 y.o. MRN: [id number]  Chief Complaint  Patient presents with   Medical Management of Chronic Issues    Patient came in today for a 4 month follow-up for diabetes, patient Blood sugar this morning was 67, patient had labs and cholesterol and LDL were high    Patient is a 68 year old female seen for follow-up on chronic conditions.  Patient states she is doing well overall.  Had recent labs with specialist at [facility] and cholesterol was noted to be high.  Total cholesterol was in the 250s and LDL also elevated.  Unable to bring up lab results on phone.  Patient denies changes in diet.  Still exercising 3 times per week.  Has noticed occasional right knee edema with increased activity such as line dancing.  Not currently having knee pain or edema.  Patient notes blood sugar has been good for the most part.  Occasionally elevated~270 in the evening shortly after eating.  CGM alerted pt to a low blood sugar of 67 this morning.  Patient typically does not check low or high readings on CGM with manual glucometer.  Taking Farxiga  5 mg in a.m. and metformin  1000 mg with breakfast.    Patient Active Problem List   Diagnosis Date Noted   Fibroid, uterine 03/23/2018   Trigger finger, acquired 02/07/2015   Routine general medical examination at a health care facility 08/09/2014   Anemia, unspecified 07/24/2013   Nonspecific abnormal electrocardiogram (ECG) (EKG) 07/23/2013   Psoriasis 07/14/2012   Menopausal state 07/14/2012   Encounter for long-term (current) use of other medications 06/17/2011   GOITER, MULTINODULAR 03/06/2009   LEUKOPENIA, CHRONIC 03/06/2009   Asymptomatic menopausal state 03/06/2009   TRIGGER FINGER, RIGHT THUMB 02/25/2008   HSV 08/22/2007   Diabetes (HCC) 08/22/2007   HYPERLIPIDEMIA 08/22/2007   DEPRESSION 08/22/2007   ALLERGIC RHINITIS 08/22/2007   ANGIOEDEMA  08/22/2007   Past Medical History:  Diagnosis Date   Allergy-induced asthma    daily inhaler   Anxiety    Arthritis    hands   Dental crown present    Depression    GERD (gastroesophageal reflux disease)    High cholesterol    Hypertension    states under control with med., has been on med. x 10 yr.   Non-insulin dependent type 2 diabetes mellitus (HCC)    PONV (postoperative nausea and vomiting)    patient thinks related to Morphine   Psoriasis    arms, bilateral leg, back, breast, neck, ears   Stenosing tenosynovitis of finger of right hand 03/2015   right ring finger   Past Surgical History:  Procedure Laterality Date   BREAST REDUCTION SURGERY  2000   CYSTOSCOPY N/A 03/23/2018   Procedure: CYSTOSCOPY;  Surgeon: Darcel Pool, MD;  Location: [facility];  Service: Gynecology;  Laterality: N/A;   LEEP  03/18/2003   MYOMECTOMY  1995   POLYPECTOMY  01/2015   uterine   REDUCTION MAMMAPLASTY     2000   SUPRACERVICAL ABDOMINAL HYSTERECTOMY Bilateral 03/23/2018   Procedure: HYSTERECTOMY SUPRACERVICAL ABDOMINAL;  Surgeon: Darcel Pool, MD;  Location: [facility];  Service: Gynecology;  Laterality: Bilateral;   TRIGGER FINGER RELEASE Left 06/15/2009   thumb   TRIGGER FINGER RELEASE Right 11/15/2008   thumb   TRIGGER FINGER RELEASE Right 04/13/2015   Procedure: RELEASE A-1 PULLEY RIGHT RING FINGER;  Surgeon: Arley Curia, MD;  Location:  [facility];  Service: Orthopedics;  Laterality: Right;   WISDOM TOOTH EXTRACTION  1972   Social History   Tobacco Use   Smoking status: Never   Smokeless tobacco: Never  Vaping Use   Vaping status: Never Used  Substance Use Topics   Alcohol use: Yes    Comment: occasionally   Drug use: No   Family History  Problem Relation Age of Onset   Diabetes Mother    Diabetes Father    Stroke Father    Hypertension Father    Glaucoma Father    Colon cancer Neg Hx    Esophageal cancer Neg Hx    Rectal cancer Neg Hx    Stomach cancer Neg Hx     Allergies  Allergen Reactions   Ace Inhibitors Other (See Comments)    ANGIOEDEMA    Morphine Itching, Nausea And Vomiting and Other (See Comments)   Other Swelling and Other (See Comments)   Ramipril Other (See Comments)    ANGIOEDEMA   Methimazole Other (See Comments)    Unknown reaction   Trichophyton Cough, Itching and Other (See Comments)    Throat itches   Latex Rash and Other (See Comments)    Used latex gloves with powder, rash occured   Merbromin Rash and Other (See Comments)    merbromin    ROS Negative unless stated above    Objective:     BP 118/76 (BP Location: Left Arm, Patient Position: Sitting, Cuff Size: Large)   Pulse 82   Temp 98.7 F (37.1 C) (Oral)   Ht 5' 0.35 (1.533 m)   Wt 187 lb 3.2 oz (84.9 kg)   SpO2 98%   BMI 36.14 kg/m  BP Readings from Last 3 Encounters:  09/29/24 118/76  05/27/24 122/84  02/19/24 128/72   Wt Readings from Last 3 Encounters:  09/29/24 187 lb 3.2 oz (84.9 kg)  06/25/24 182 lb (82.6 kg)  05/27/24 182 lb 6.4 oz (82.7 kg)      Physical Exam Constitutional:      General: She is not in acute distress.    Appearance: Normal appearance.  HENT:     Head: Normocephalic and atraumatic.     Nose: Nose normal.     Mouth/Throat:     Mouth: Mucous membranes are moist.  Cardiovascular:     Rate and Rhythm: Normal rate and regular rhythm.     Heart sounds: Normal heart sounds. No murmur heard.    No gallop.  Pulmonary:     Effort: Pulmonary effort is normal. No respiratory distress.     Breath sounds: Normal breath sounds. No wheezing, rhonchi or rales.  Skin:    General: Skin is warm and dry.     Comments: Hyperpigmentation of b/l arms smooth and flat in appearance due to psoriasis.  Neurological:     Mental Status: She is alert and oriented to person, place, and time.    Diabetic Foot Exam - Simple   Simple Foot Form Diabetic Foot exam was performed with the following findings: Yes 09/29/2024  9:42 AM   Visual Inspection No deformities, no ulcerations, no other skin breakdown bilaterally: Yes Sensation Testing Intact to touch and monofilament testing bilaterally: Yes See comments: Yes Pulse Check Posterior Tibialis and Dorsalis pulse intact bilaterally: Yes See comments: Yes Comments Moderately decreased vibratory and monofilament sense of L foot.         09/29/2024    9:44 AM 06/25/2024   11:38 AM 05/27/2024  10:41 AM  Depression screen PHQ 2/9  Decreased Interest 0 0 0  Down, Depressed, Hopeless 0 0 0  PHQ - 2 Score 0 0 0  Altered sleeping 0 0 0  Tired, decreased energy 0 0 0  Change in appetite 0 0 0  Feeling bad or failure about yourself  0 0 0  Trouble concentrating 0 0 0  Moving slowly or fidgety/restless 0 0 0  Suicidal thoughts 0 0 0  PHQ-9 Score 0 0  0   Difficult doing work/chores Not difficult at all       Data saved with a previous flowsheet row definition      09/29/2024    9:44 AM 05/27/2024   10:41 AM 02/19/2024    9:28 AM 01/01/2024    3:26 PM  GAD 7 : Generalized Anxiety Score  Nervous, Anxious, on Edge 0 0 0 0  Control/stop worrying 0 0 0 0  Worry too much - different things 0 0 0 0  Trouble relaxing 0 0 0 0  Restless 0 0 0 0  Easily annoyed or irritable 0 0 0 0  Afraid - awful might happen 0 0 0 0  Total GAD 7 Score 0 0 0 0  Anxiety Difficulty Not difficult at all        No results found for any visits on 09/29/24.    Assessment & Plan:   Type 2 diabetes mellitus with other specified complication, without long-term current use of insulin (HCC) -Hgb A1C 7.1% on 10/16 at outside provider.  Was 6.9% on 05/27/24 here. -continue lifestyle modifications, metformin  1000 mg in am, and farxiga  5 mg daily. -foot exam this visit with decreased sensation to vibration and monofilament in L foot.  Rx for DM shoes and referral to podiatry -Angioedema with ACE-I -start statin -eye exam done 05/17/24 -     For Home Use Only DME Diabetic Shoe -      Ambulatory referral to Podiatry  Essential hypertension -controlled -continue triamterene -hydrochlorothiazide  37.5-25 mg daily -lifestyle modifications -Monitor bp at home and bring log to clinic  Mixed hyperlipidemia -Total cholesterol 254, LDL 159, Triglycerides 96, HDL 75 on 09/02/24 fasting.   -discussed starting statin.  Can try a few times per wk to monitor for s/e.  Can also take with CoQ10. -continue diet changes and exercise. -     Rosuvastatin Calcium ; Take 1 tablet (5 mg total) by mouth daily.  Dispense: 90 tablet; Refill: 3  Swelling of right knee -intermittent edema of R knee.  Currently resolved. -likely from overuse vs arthritis.  Supportive care including supportive shoes, compression, ice, rest, NSAIDs or Tylenol , topical analgesic prn.  Return in about 4 months (around 01/27/2025), or if symptoms worsen or fail to improve.   Clotilda JONELLE Single, MD

## 2024-09-29 NOTE — Patient Instructions (Addendum)
 A prescription for cholesterol medication rosuvastatin 5 mg daily sent to your pharmacy.  For some people cholesterol medication can cause muscle soreness or joint pain.  Taking the medication with over-the-counter co-Q10 supplement can help prevent this from happening.  Some people  also fine that taking the medication a few times a week instead of daily helps keep them from having the muscle discomfort.  You can try taking the medication a few times per week then increasing to daily.  If you notice any of the muscle soreness/joint pain notify clinic and stop the medicine.  A prescription for diabetic shoes and a referral to podiatry were also placed this visit.  They should contact you about setting up the appointment.  Continue Farxiga  5 mg daily and metformin  1000 mg in morning.

## 2024-10-07 DIAGNOSIS — J3089 Other allergic rhinitis: Secondary | ICD-10-CM | POA: Diagnosis not present

## 2024-10-07 DIAGNOSIS — J3081 Allergic rhinitis due to animal (cat) (dog) hair and dander: Secondary | ICD-10-CM | POA: Diagnosis not present

## 2024-10-07 DIAGNOSIS — J301 Allergic rhinitis due to pollen: Secondary | ICD-10-CM | POA: Diagnosis not present

## 2024-10-29 ENCOUNTER — Ambulatory Visit: Admitting: Podiatry

## 2024-10-29 DIAGNOSIS — E119 Type 2 diabetes mellitus without complications: Secondary | ICD-10-CM | POA: Diagnosis not present

## 2024-10-29 DIAGNOSIS — Q666 Other congenital valgus deformities of feet: Secondary | ICD-10-CM | POA: Diagnosis not present

## 2024-10-29 DIAGNOSIS — Z0189 Encounter for other specified special examinations: Secondary | ICD-10-CM | POA: Diagnosis not present

## 2024-10-29 NOTE — Progress Notes (Unsigned)
 Subjective: Brittany Taylor presents today referred by Mercer Clotilda SAUNDERS, MD for diabetic foot evaluation.  Patient relates many year history of diabetes.  Patient denies any history of foot wounds.  Patient denies any history of numbness, tingling, burning, pins/needles sensations.  Past Medical History:  Diagnosis Date   Allergy-induced asthma    daily inhaler   Anxiety    Arthritis    hands   Dental crown present    Depression    GERD (gastroesophageal reflux disease)    High cholesterol    Hypertension    states under control with med., has been on med. x 10 yr.   Non-insulin dependent type 2 diabetes mellitus (HCC)    PONV (postoperative nausea and vomiting)    patient thinks related to Morphine   Psoriasis    arms, bilateral leg, back, breast, neck, ears   Stenosing tenosynovitis of finger of right hand 03/2015   right ring finger    Patient Active Problem List   Diagnosis Date Noted   Fibroid, uterine 03/23/2018   Trigger finger, acquired 02/07/2015   Routine general medical examination at a health care facility 08/09/2014   Anemia, unspecified 07/24/2013   Nonspecific abnormal electrocardiogram (ECG) (EKG) 07/23/2013   Psoriasis 07/14/2012   Menopausal state 07/14/2012   Encounter for long-term (current) use of other medications 06/17/2011   GOITER, MULTINODULAR 03/06/2009   LEUKOPENIA, CHRONIC 03/06/2009   Asymptomatic menopausal state 03/06/2009   TRIGGER FINGER, RIGHT THUMB 02/25/2008   HSV 08/22/2007   Diabetes (HCC) 08/22/2007   HYPERLIPIDEMIA 08/22/2007   DEPRESSION 08/22/2007   ALLERGIC RHINITIS 08/22/2007   ANGIOEDEMA 08/22/2007    Past Surgical History:  Procedure Laterality Date   BREAST REDUCTION SURGERY  2000   CYSTOSCOPY N/A 03/23/2018   Procedure: CYSTOSCOPY;  Surgeon: Darcel Pool, MD;  Location: [facility];  Service: Gynecology;  Laterality: N/A;   LEEP  03/18/2003   MYOMECTOMY  1995   POLYPECTOMY  01/2015   uterine   REDUCTION MAMMAPLASTY      2000   SUPRACERVICAL ABDOMINAL HYSTERECTOMY Bilateral 03/23/2018   Procedure: HYSTERECTOMY SUPRACERVICAL ABDOMINAL;  Surgeon: Darcel Pool, MD;  Location: [facility];  Service: Gynecology;  Laterality: Bilateral;   TRIGGER FINGER RELEASE Left 06/15/2009   thumb   TRIGGER FINGER RELEASE Right 11/15/2008   thumb   TRIGGER FINGER RELEASE Right 04/13/2015   Procedure: RELEASE A-1 PULLEY RIGHT RING FINGER;  Surgeon: Arley Curia, MD;  Location: [facility];  Service: Orthopedics;  Laterality: Right;   WISDOM TOOTH EXTRACTION  1972    Medications Ordered Prior to Encounter[1]   Allergies[2]  Social History   Occupational History   Occupation: Teacher, Adult Education: [employer]  Tobacco Use   Smoking status: Never   Smokeless tobacco: Never  Vaping Use   Vaping status: Never Used  Substance and Sexual Activity   Alcohol use: Yes    Comment: occasionally   Drug use: No   Sexual activity: Yes    Family History  Problem Relation Age of Onset   Diabetes Mother    Diabetes Father    Stroke Father    Hypertension Father    Glaucoma Father    Colon cancer Neg Hx    Esophageal cancer Neg Hx    Rectal cancer Neg Hx    Stomach cancer Neg Hx     Immunization History  Administered Date(s) Administered   Dtap, Unspecified 06/18/1958   Fluad Quad(high Dose 65+) 08/02/2024  Fluad Trivalent(High Dose 65+) 08/02/2024   Hep B, Unspecified 11/18/1992   Hepatitis B 11/18/1992   INFLUENZA, HIGH DOSE SEASONAL PF 02/04/2017, 01/26/2018   Influenza Whole 09/18/2010   Influenza, Seasonal, Injecte, Preservative Fre 09/07/2010, 09/13/2011   Influenza,inj,Quad PF,6+ Mos 08/09/2014, 08/10/2015, 08/09/2016, 08/08/2017, 08/25/2018   Influenza,inj,quad, With Preservative 08/03/2020   Influenza,trivalent, recombinat, inj, PF 08/08/2023   Influenza-Unspecified 08/09/2017, 08/03/2020, 08/20/2021, 11/18/2021, 08/12/2022, 08/08/2023   PFIZER Comirnaty(Gray Top)Covid-19 Tri-Sucrose  Vaccine 06/04/2021, 01/07/2022   PFIZER(Purple Top)SARS-COV-2 Vaccination 12/24/2019, 01/14/2020, 04/24/2020, 08/14/2020, 01/07/2022   PNEUMOCOCCAL CONJUGATE-20 09/29/2023   PPD Test 05/19/2017   Pfizer(Comirnaty)Fall Seasonal Vaccine 12 years and older 08/27/2022, 08/08/2023, 08/02/2024   Pneumococcal Polysaccharide-23 07/14/2012, 02/04/2017, 01/26/2018   Pneumococcal-Unspecified 08/10/2015   Polio, Unspecified 12/12/1961   Td 03/06/2009   Td (Adult),unspecified 08/07/1970   Tdap 03/06/2009, 04/02/2019   Unspecified SARS-COV-2 Vaccination 03/09/2020, 08/27/2022, 08/08/2023   Zoster Recombinant(Shingrix ) 09/29/2023, 12/19/2023, 03/01/2024   Zoster, Live 07/20/2015, 08/10/2015    Review of systems: Positive Findings in bold print.  Constitutional:  chills, fatigue, fever, sweats, weight change Communication: nurse, learning disability, sign presenter, broadcasting, hand writing, iPad/Android device Head: headaches, head injury Eyes: changes in vision, eye pain, glaucoma, cataracts, macular degeneration, diplopia, glare,  light sensitivity, eyeglasses or contacts, blindness Ears nose mouth throat: hearing impaired, hearing aids,  ringing in ears, deaf, sign language,  vertigo, nosebleeds,  rhinitis,  cold sores, snoring, swollen glands Cardiovascular: HTN, edema, arrhythmia, pacemaker in place, defibrillator in place, chest pain/tightness, chronic anticoagulation, blood clot, heart failure, MI Peripheral Vascular: leg cramps, varicose veins, blood clots, lymphedema, varicosities Respiratory:  asthma, difficulty breathing, denies congestion, SOB, wheezing, cough, emphysema Gastrointestinal: change in appetite or weight, abdominal pain, constipation, diarrhea, nausea, vomiting, vomiting blood, change in bowel habits, abdominal pain, jaundice, rectal bleeding, hemorrhoids, GERD Genitourinary:  nocturia,  pain on urination, polyuria,  blood in urine, Foley catheter, urinary urgency, ESRD on  hemodialysis Musculoskeletal: amputation, cramping, stiff joints, painful joints, decreased joint motion, fractures, OA, gout, hemiplegia, paraplegia, uses cane, wheelchair bound, uses walker, uses rollator Skin: +changes in toenails, color change, dryness, itching, mole changes,  rash, wound(s) Neurological: headaches, numbness in feet, paresthesias in feet, burning in feet, fainting,  seizures, change in speech, migraines, memory problems/poor historian, cerebral palsy, weakness, paralysis, CVA, TIA Endocrine: diabetes, hypothyroidism, hyperthyroidism,  goiter, dry mouth, flushing, heat intolerance, cold intolerance,  excessive thirst, denies polyuria,  nocturia Hematological:  easy bleeding, excessive bleeding, easy bruising, enlarged lymph nodes, on long term blood thinner, history of past transusions Allergy/immunological:  hives, eczema, frequent infections, multiple drug allergies, seasonal allergies, transplant recipient, multiple food allergies Psychiatric:  anxiety, depression, mood disorder, suicidal ideations, hallucinations, insomnia  Objective: There were no vitals filed for this visit. Vascular Examination: Capillary refill time less than 3 seconds x 10 digits.  Dorsalis pedis pulses 2 out of 4.  Posterior tibial pulses palpable 2 out of 4.  Digital hair present x 10 digits.  Skin temperature gradient WNL b/l.  Dermatological Examination: Skin with normal turgor, texture and tone b/l  Toenails 1-5 b/l discolored, thick, dystrophic with subungual debris and pain with palpation to nailbeds due to thickness of nails.  Musculoskeletal: Muscle strength 5/5 to all LE muscle groups.  Neurological: Sensation decreased with 10 gram monofilament.  Vibratory sensation decreased  Assessment: NIDDM Encounter for diabetic foot examination  Plan: Discussed diabetic foot care principles. Literature dispensed on today. Patient to continue soft, supportive shoe gear  daily. Patient to report any pedal injuries to medical professional immediately.  Follow up one year. Patient/POA to call should there be a concern in the interim.  Pes planovalgus/foot deformity -I explained to patient the etiology of pes planovalgus and relationship with heel pain/arch pain and various treatment options were discussed.  Given patient foot structure in the setting of heel pain/arch pain I believe patient will benefit from custom-made orthotics to help control the hindfoot motion support the arch of the foot and take the stress away from arches.  Patient agrees with the plan like to proceed with orthotics -Patient was casted for orthotics with offloading of bilateral fifth metatarsal submet     Pes planovalgus orthotics offload bilateral 5    [1]  Current Outpatient Medications on File Prior to Visit  Medication Sig Dispense Refill   albuterol  (VENTOLIN  HFA) 108 (90 Base) MCG/ACT inhaler Inhale 2 puffs into the lungs every 6 (six) hours as needed for wheezing or shortness of breath. 8 g 0   aspirin 81 MG tablet Take 162 mg by mouth daily.      azelastine (OPTIVAR) 0.05 % ophthalmic solution Place 1 drop into both eyes 2 (two) times daily as needed (for itchy/allergy eyes).      blood glucose meter kit and supplies KIT Dispense based on patient and insurance preference. Use up to four times daily as directed. 1 each 0   Continuous Glucose Sensor (FREESTYLE LIBRE 3 PLUS SENSOR) MISC Change sensor every 15 days. 3 each 0   Continuous Glucose Sensor (FREESTYLE LIBRE 3 SENSOR) MISC place 1 sensor on the skin every 14 days use to check glucose continuously 2 each 4   dapagliflozin  propanediol (FARXIGA ) 5 MG TABS tablet Take 1 tablet (5 mg total) by mouth daily before breakfast. 90 tablet 3   docusate sodium  (COLACE) 100 MG capsule Take 1 capsule (100 mg total) by mouth 2 (two) times daily. 10 capsule 0   EPINEPHrine 0.3 mg/0.3 mL IJ SOAJ injection Inject 0.3 mg into the muscle  daily as needed for anaphylaxis.  0   Estradiol 10 MCG TABS vaginal tablet Place 10 mcg vaginally 2 (two) times a week.     FLUoxetine  (PROZAC ) 20 MG capsule TAKE TWO CAPSULES BY MOUTH DAILY 180 capsule 1   fluticasone  (FLONASE ) 50 MCG/ACT nasal spray Place 2 sprays into both nostrils daily. 16 g 1   fluticasone -salmeterol (ADVAIR) 250-50 MCG/ACT AEPB Inhale 1 puff into the lungs 2 (two) times daily.     glucose blood (ONETOUCH VERIO) test strip 1 each by Other route daily. And lancets 1/day 250.00 100 each 12   hydrOXYzine  (ATARAX /VISTARIL ) 10 MG tablet Take 1 tablet (10 mg total) by mouth every 6 (six) hours as needed for itching. 90 tablet 2   ibuprofen  (ADVIL ,MOTRIN ) 600 MG tablet 1  po  pc every 6 hours with food, for 5 days then as needed for pain 30 tablet 1   loratadine  (CLARITIN ) 10 MG tablet Take 10 mg by mouth daily.     metFORMIN  (GLUCOPHAGE ) 1000 MG tablet Take 1 tablet (1,000 mg total) by mouth daily with breakfast. 90 tablet 3   montelukast  (SINGULAIR ) 10 MG tablet Take by mouth.     Multiple Vitamin (MULTIVITAMIN ADULT PO)      NON FORMULARY Inject into the skin once a week. Allergy shots     Risankizumab-rzaa (SKYRIZI [place]) Skyrizi     rosuvastatin  (CRESTOR ) 5 MG tablet Take 1 tablet (5 mg total) by mouth daily. 90 tablet 3   simethicone  (MYLICON) 80 MG chewable tablet Chew  1 tablet (80 mg total) by mouth 4 (four) times daily as needed for flatulence. 30 tablet 0   triamcinolone  ointment (KENALOG ) 0.1 % Apply 1 application topically 2 (two) times daily as needed (for psoriasis). 30 g 3   triamterene -hydrochlorothiazide  (MAXZIDE -25) 37.5-25 MG tablet TAKE ONE TABLET BY MOUTH ONCE A DAY 90 tablet 3   No current facility-administered medications on file prior to visit.  [2]  Allergies Allergen Reactions   Ace Inhibitors Other (See Comments)    ANGIOEDEMA    Morphine Itching, Nausea And Vomiting and Other (See Comments)   Other Swelling and Other (See Comments)   Ramipril Other  (See Comments)    ANGIOEDEMA   Methimazole Other (See Comments)    Unknown reaction   Trichophyton Cough, Itching and Other (See Comments)    Throat itches   Latex Rash and Other (See Comments)    Used latex gloves with powder, rash occured   Merbromin Rash and Other (See Comments)    merbromin

## 2024-11-01 LAB — OPHTHALMOLOGY REPORT-SCANNED

## 2024-11-13 ENCOUNTER — Other Ambulatory Visit: Payer: Self-pay | Admitting: Family Medicine

## 2024-11-13 DIAGNOSIS — F339 Major depressive disorder, recurrent, unspecified: Secondary | ICD-10-CM

## 2024-11-13 DIAGNOSIS — E1169 Type 2 diabetes mellitus with other specified complication: Secondary | ICD-10-CM

## 2024-12-03 ENCOUNTER — Telehealth: Payer: Self-pay | Admitting: Podiatry

## 2024-12-03 NOTE — Telephone Encounter (Signed)
 Ortho in [place], unable to lvm.. sent mychart to schedule an appointment in [place] office

## 2025-01-27 ENCOUNTER — Ambulatory Visit: Admitting: Family Medicine

## 2025-07-01 ENCOUNTER — Ambulatory Visit
